# Patient Record
Sex: Male | Born: 1950
Health system: Southern US, Community
[De-identification: ages and names within clinical notes are randomized; demographics above are authoritative.]

## PROBLEM LIST (undated history)

## (undated) DIAGNOSIS — E039 Hypothyroidism, unspecified: Secondary | ICD-10-CM

## (undated) DIAGNOSIS — C679 Malignant neoplasm of bladder, unspecified: Secondary | ICD-10-CM

## (undated) DIAGNOSIS — G459 Transient cerebral ischemic attack, unspecified: Secondary | ICD-10-CM

## (undated) DIAGNOSIS — K4090 Unilateral inguinal hernia, without obstruction or gangrene, not specified as recurrent: Secondary | ICD-10-CM

## (undated) DIAGNOSIS — F32A Depression, unspecified: Secondary | ICD-10-CM

## (undated) DIAGNOSIS — F419 Anxiety disorder, unspecified: Secondary | ICD-10-CM

## (undated) DIAGNOSIS — M1990 Unspecified osteoarthritis, unspecified site: Secondary | ICD-10-CM

## (undated) DIAGNOSIS — D696 Thrombocytopenia, unspecified: Secondary | ICD-10-CM

## (undated) DIAGNOSIS — J189 Pneumonia, unspecified organism: Secondary | ICD-10-CM

## (undated) DIAGNOSIS — K648 Other hemorrhoids: Secondary | ICD-10-CM

## (undated) DIAGNOSIS — H353290 Exudative age-related macular degeneration, unspecified eye, stage unspecified: Secondary | ICD-10-CM

## (undated) DIAGNOSIS — E785 Hyperlipidemia, unspecified: Secondary | ICD-10-CM

## (undated) DIAGNOSIS — I2699 Other pulmonary embolism without acute cor pulmonale: Secondary | ICD-10-CM

## (undated) DIAGNOSIS — R7309 Other abnormal glucose: Secondary | ICD-10-CM

## (undated) DIAGNOSIS — I639 Cerebral infarction, unspecified: Secondary | ICD-10-CM

## (undated) DIAGNOSIS — Z87448 Personal history of other diseases of urinary system: Secondary | ICD-10-CM

## (undated) DIAGNOSIS — F329 Major depressive disorder, single episode, unspecified: Secondary | ICD-10-CM

## (undated) DIAGNOSIS — F1010 Alcohol abuse, uncomplicated: Secondary | ICD-10-CM

## (undated) DIAGNOSIS — I2510 Atherosclerotic heart disease of native coronary artery without angina pectoris: Secondary | ICD-10-CM

## (undated) DIAGNOSIS — I219 Acute myocardial infarction, unspecified: Secondary | ICD-10-CM

## (undated) DIAGNOSIS — K219 Gastro-esophageal reflux disease without esophagitis: Secondary | ICD-10-CM

## (undated) DIAGNOSIS — I10 Essential (primary) hypertension: Secondary | ICD-10-CM

## (undated) DIAGNOSIS — Z86718 Personal history of other venous thrombosis and embolism: Secondary | ICD-10-CM

## (undated) HISTORY — DX: Depression, unspecified: F32.A

## (undated) HISTORY — DX: Malignant neoplasm of bladder, unspecified: C67.9

## (undated) HISTORY — DX: Transient cerebral ischemic attack, unspecified: G45.9

## (undated) HISTORY — DX: Unilateral inguinal hernia, without obstruction or gangrene, not specified as recurrent: K40.90

## (undated) HISTORY — DX: Essential (primary) hypertension: I10

## (undated) HISTORY — DX: Atherosclerotic heart disease of native coronary artery without angina pectoris: I25.10

## (undated) HISTORY — DX: Alcohol abuse, uncomplicated: F10.10

## (undated) HISTORY — DX: Exudative age-related macular degeneration, unspecified eye, stage unspecified: H35.3290

## (undated) HISTORY — DX: Other hemorrhoids: K64.8

## (undated) HISTORY — DX: Unspecified osteoarthritis, unspecified site: M19.90

## (undated) HISTORY — DX: Cerebral infarction, unspecified: I63.9

## (undated) HISTORY — DX: Personal history of other diseases of urinary system: Z87.448

## (undated) HISTORY — DX: Major depressive disorder, single episode, unspecified: F32.9

## (undated) HISTORY — DX: Other abnormal glucose: R73.09

## (undated) HISTORY — DX: Personal history of other venous thrombosis and embolism: Z86.718

## (undated) HISTORY — DX: Anxiety disorder, unspecified: F41.9

## (undated) HISTORY — DX: Thrombocytopenia, unspecified: D69.6

## (undated) HISTORY — PX: TONSILLECTOMY: SUR1361

## (undated) HISTORY — DX: Hyperlipidemia, unspecified: E78.5

## (undated) HISTORY — PX: EYE SURGERY: SHX253

---

## 2002-04-24 ENCOUNTER — Emergency Department (HOSPITAL_COMMUNITY): Admission: EM | Admit: 2002-04-24 | Discharge: 2002-04-25 | Payer: Self-pay | Admitting: Emergency Medicine

## 2002-04-24 ENCOUNTER — Encounter: Payer: Self-pay | Admitting: Emergency Medicine

## 2003-09-27 DIAGNOSIS — K648 Other hemorrhoids: Secondary | ICD-10-CM | POA: Insufficient documentation

## 2003-10-12 LAB — HM COLONOSCOPY: HM Colonoscopy: ABNORMAL

## 2004-01-25 ENCOUNTER — Encounter: Admission: RE | Admit: 2004-01-25 | Discharge: 2004-01-25 | Payer: Self-pay | Admitting: Family Medicine

## 2004-02-03 ENCOUNTER — Encounter: Admission: RE | Admit: 2004-02-03 | Discharge: 2004-02-03 | Payer: Self-pay | Admitting: Internal Medicine

## 2004-02-04 ENCOUNTER — Encounter: Admission: RE | Admit: 2004-02-04 | Discharge: 2004-02-04 | Payer: Self-pay | Admitting: Family Medicine

## 2004-08-01 ENCOUNTER — Ambulatory Visit: Payer: Self-pay | Admitting: Family Medicine

## 2004-11-21 ENCOUNTER — Ambulatory Visit: Payer: Self-pay | Admitting: Family Medicine

## 2004-11-29 ENCOUNTER — Ambulatory Visit: Payer: Self-pay | Admitting: Family Medicine

## 2005-02-01 ENCOUNTER — Ambulatory Visit: Payer: Self-pay | Admitting: Internal Medicine

## 2005-05-25 ENCOUNTER — Observation Stay (HOSPITAL_COMMUNITY): Admission: EM | Admit: 2005-05-25 | Discharge: 2005-05-25 | Payer: Self-pay | Admitting: Emergency Medicine

## 2005-05-25 DIAGNOSIS — I639 Cerebral infarction, unspecified: Secondary | ICD-10-CM

## 2005-05-25 HISTORY — DX: Cerebral infarction, unspecified: I63.9

## 2005-05-27 ENCOUNTER — Ambulatory Visit: Payer: Self-pay | Admitting: Family Medicine

## 2005-05-28 ENCOUNTER — Ambulatory Visit: Payer: Self-pay | Admitting: Family Medicine

## 2005-06-07 ENCOUNTER — Ambulatory Visit: Payer: Self-pay

## 2005-06-13 ENCOUNTER — Ambulatory Visit: Payer: Self-pay | Admitting: Family Medicine

## 2005-06-18 ENCOUNTER — Ambulatory Visit: Payer: Self-pay | Admitting: Professional

## 2005-07-15 ENCOUNTER — Ambulatory Visit: Payer: Self-pay | Admitting: Family Medicine

## 2005-07-26 ENCOUNTER — Ambulatory Visit: Payer: Self-pay | Admitting: Licensed Clinical Social Worker

## 2005-08-02 ENCOUNTER — Ambulatory Visit: Payer: Self-pay | Admitting: Licensed Clinical Social Worker

## 2005-08-13 ENCOUNTER — Ambulatory Visit: Payer: Self-pay | Admitting: Licensed Clinical Social Worker

## 2005-08-30 ENCOUNTER — Ambulatory Visit: Payer: Self-pay | Admitting: Licensed Clinical Social Worker

## 2006-05-26 ENCOUNTER — Ambulatory Visit: Payer: Self-pay | Admitting: Licensed Clinical Social Worker

## 2006-07-11 ENCOUNTER — Ambulatory Visit: Payer: Self-pay | Admitting: Family Medicine

## 2006-08-26 DIAGNOSIS — G459 Transient cerebral ischemic attack, unspecified: Secondary | ICD-10-CM

## 2006-08-26 HISTORY — DX: Transient cerebral ischemic attack, unspecified: G45.9

## 2006-12-30 DIAGNOSIS — F329 Major depressive disorder, single episode, unspecified: Secondary | ICD-10-CM | POA: Insufficient documentation

## 2006-12-30 DIAGNOSIS — F411 Generalized anxiety disorder: Secondary | ICD-10-CM | POA: Insufficient documentation

## 2006-12-30 DIAGNOSIS — F39 Unspecified mood [affective] disorder: Secondary | ICD-10-CM | POA: Insufficient documentation

## 2006-12-30 DIAGNOSIS — F419 Anxiety disorder, unspecified: Secondary | ICD-10-CM | POA: Insufficient documentation

## 2007-03-05 ENCOUNTER — Ambulatory Visit: Payer: Self-pay | Admitting: Family Medicine

## 2007-03-05 DIAGNOSIS — F1010 Alcohol abuse, uncomplicated: Secondary | ICD-10-CM | POA: Insufficient documentation

## 2007-03-18 ENCOUNTER — Ambulatory Visit: Payer: Self-pay | Admitting: Licensed Clinical Social Worker

## 2007-08-08 ENCOUNTER — Encounter: Payer: Self-pay | Admitting: Family Medicine

## 2007-08-08 ENCOUNTER — Inpatient Hospital Stay (HOSPITAL_COMMUNITY): Admission: EM | Admit: 2007-08-08 | Discharge: 2007-08-11 | Payer: Self-pay | Admitting: Emergency Medicine

## 2007-08-08 ENCOUNTER — Ambulatory Visit: Payer: Self-pay | Admitting: Internal Medicine

## 2007-08-08 DIAGNOSIS — I82409 Acute embolism and thrombosis of unspecified deep veins of unspecified lower extremity: Secondary | ICD-10-CM | POA: Insufficient documentation

## 2007-08-08 DIAGNOSIS — Z86718 Personal history of other venous thrombosis and embolism: Secondary | ICD-10-CM | POA: Insufficient documentation

## 2007-08-10 ENCOUNTER — Encounter: Payer: Self-pay | Admitting: Family Medicine

## 2007-08-10 ENCOUNTER — Encounter: Payer: Self-pay | Admitting: Internal Medicine

## 2007-08-10 ENCOUNTER — Ambulatory Visit: Payer: Self-pay | Admitting: *Deleted

## 2007-08-11 ENCOUNTER — Encounter: Payer: Self-pay | Admitting: Family Medicine

## 2007-08-12 ENCOUNTER — Telehealth (INDEPENDENT_AMBULATORY_CARE_PROVIDER_SITE_OTHER): Payer: Self-pay | Admitting: *Deleted

## 2007-08-13 ENCOUNTER — Ambulatory Visit: Payer: Self-pay

## 2007-08-13 ENCOUNTER — Ambulatory Visit: Payer: Self-pay | Admitting: Cardiology

## 2007-08-13 ENCOUNTER — Telehealth: Payer: Self-pay | Admitting: Family Medicine

## 2007-08-13 ENCOUNTER — Ambulatory Visit: Payer: Self-pay | Admitting: Family Medicine

## 2007-08-17 ENCOUNTER — Ambulatory Visit: Payer: Self-pay | Admitting: Family Medicine

## 2007-08-17 LAB — CONVERTED CEMR LAB
INR: 2.9
Prothrombin Time: 20.5 s

## 2007-08-21 ENCOUNTER — Encounter: Payer: Self-pay | Admitting: Family Medicine

## 2007-08-24 ENCOUNTER — Ambulatory Visit: Payer: Self-pay | Admitting: Family Medicine

## 2007-08-24 LAB — CONVERTED CEMR LAB: Prothrombin Time: 25 s

## 2007-08-31 ENCOUNTER — Ambulatory Visit: Payer: Self-pay | Admitting: Family Medicine

## 2007-08-31 LAB — CONVERTED CEMR LAB
INR: 4.6 — ABNORMAL HIGH (ref 0.8–1.0)
Prothrombin Time: 27.7 s

## 2007-09-02 ENCOUNTER — Ambulatory Visit: Payer: Self-pay | Admitting: Family Medicine

## 2007-09-02 LAB — CONVERTED CEMR LAB: INR: 2.4

## 2007-09-09 ENCOUNTER — Ambulatory Visit: Payer: Self-pay | Admitting: Family Medicine

## 2007-09-09 LAB — CONVERTED CEMR LAB: INR: 3.1

## 2007-09-10 ENCOUNTER — Encounter: Payer: Self-pay | Admitting: Family Medicine

## 2007-09-17 ENCOUNTER — Ambulatory Visit: Payer: Self-pay | Admitting: Family Medicine

## 2007-09-23 ENCOUNTER — Encounter (INDEPENDENT_AMBULATORY_CARE_PROVIDER_SITE_OTHER): Payer: Self-pay | Admitting: Internal Medicine

## 2007-09-25 ENCOUNTER — Ambulatory Visit: Payer: Self-pay | Admitting: Family Medicine

## 2007-09-25 LAB — CONVERTED CEMR LAB: INR: 3.6

## 2007-10-15 ENCOUNTER — Ambulatory Visit: Payer: Self-pay | Admitting: Family Medicine

## 2007-10-15 LAB — CONVERTED CEMR LAB

## 2007-11-06 ENCOUNTER — Ambulatory Visit: Payer: Self-pay | Admitting: Family Medicine

## 2007-11-06 LAB — CONVERTED CEMR LAB
INR: 1.9
Prothrombin Time: 16.9 s

## 2007-11-20 ENCOUNTER — Ambulatory Visit: Payer: Self-pay | Admitting: Family Medicine

## 2008-01-22 ENCOUNTER — Ambulatory Visit: Payer: Self-pay | Admitting: Family Medicine

## 2008-01-22 LAB — CONVERTED CEMR LAB: INR: 1.4

## 2008-02-08 ENCOUNTER — Ambulatory Visit: Payer: Self-pay | Admitting: Family Medicine

## 2008-02-08 ENCOUNTER — Encounter (INDEPENDENT_AMBULATORY_CARE_PROVIDER_SITE_OTHER): Payer: Self-pay | Admitting: Internal Medicine

## 2008-02-08 DIAGNOSIS — M25519 Pain in unspecified shoulder: Secondary | ICD-10-CM | POA: Insufficient documentation

## 2008-03-08 ENCOUNTER — Ambulatory Visit (HOSPITAL_COMMUNITY): Admission: RE | Admit: 2008-03-08 | Discharge: 2008-03-08 | Payer: Self-pay | Admitting: Orthopaedic Surgery

## 2008-03-08 HISTORY — PX: SHOULDER SURGERY: SHX246

## 2008-03-11 ENCOUNTER — Telehealth: Payer: Self-pay | Admitting: Family Medicine

## 2008-03-11 ENCOUNTER — Ambulatory Visit: Payer: Self-pay | Admitting: Family Medicine

## 2008-03-14 LAB — CONVERTED CEMR LAB: INR: 1.1

## 2008-03-18 ENCOUNTER — Ambulatory Visit: Payer: Self-pay | Admitting: Family Medicine

## 2008-03-29 ENCOUNTER — Ambulatory Visit: Payer: Self-pay | Admitting: Family Medicine

## 2008-03-30 ENCOUNTER — Telehealth (INDEPENDENT_AMBULATORY_CARE_PROVIDER_SITE_OTHER): Payer: Self-pay | Admitting: Internal Medicine

## 2008-03-30 LAB — CONVERTED CEMR LAB
Basophils Relative: 0.4 % (ref 0.0–3.0)
Eosinophils Absolute: 0.1 10*3/uL (ref 0.0–0.7)
HCT: 50.6 % (ref 39.0–52.0)
Lymphocytes Relative: 27.6 % (ref 12.0–46.0)
MCHC: 34.1 g/dL (ref 30.0–36.0)
MCV: 95.2 fL (ref 78.0–100.0)
Monocytes Relative: 10.1 % (ref 3.0–12.0)
Neutro Abs: 4.1 10*3/uL (ref 1.4–7.7)
RDW: 12.8 % (ref 11.5–14.6)

## 2008-04-01 ENCOUNTER — Ambulatory Visit: Payer: Self-pay | Admitting: Family Medicine

## 2008-04-01 LAB — CONVERTED CEMR LAB
INR: 2.4
Prothrombin Time: 18.8 s

## 2008-04-18 ENCOUNTER — Ambulatory Visit: Payer: Self-pay | Admitting: Family Medicine

## 2008-04-18 LAB — CONVERTED CEMR LAB: INR: 1.8

## 2008-04-25 ENCOUNTER — Ambulatory Visit: Payer: Self-pay | Admitting: Family Medicine

## 2008-05-03 ENCOUNTER — Ambulatory Visit: Payer: Self-pay | Admitting: Family Medicine

## 2008-05-03 ENCOUNTER — Ambulatory Visit: Payer: Self-pay

## 2008-05-03 DIAGNOSIS — M79609 Pain in unspecified limb: Secondary | ICD-10-CM | POA: Insufficient documentation

## 2008-05-13 ENCOUNTER — Ambulatory Visit: Payer: Self-pay | Admitting: Family Medicine

## 2008-05-13 LAB — CONVERTED CEMR LAB: INR: 2.4

## 2008-05-26 ENCOUNTER — Ambulatory Visit: Payer: Self-pay | Admitting: Family Medicine

## 2008-06-07 ENCOUNTER — Telehealth (INDEPENDENT_AMBULATORY_CARE_PROVIDER_SITE_OTHER): Payer: Self-pay | Admitting: *Deleted

## 2008-06-23 ENCOUNTER — Ambulatory Visit: Payer: Self-pay | Admitting: Family Medicine

## 2008-06-23 LAB — CONVERTED CEMR LAB: INR: 2.6

## 2008-07-20 ENCOUNTER — Ambulatory Visit: Payer: Self-pay | Admitting: Family Medicine

## 2008-07-20 LAB — CONVERTED CEMR LAB: Prothrombin Time: 18.1 s

## 2008-08-17 ENCOUNTER — Ambulatory Visit: Payer: Self-pay | Admitting: Family Medicine

## 2008-08-23 ENCOUNTER — Telehealth: Payer: Self-pay | Admitting: Family Medicine

## 2008-08-24 LAB — CONVERTED CEMR LAB
INR: 1.6
Prothrombin Time: 15.6 s

## 2008-08-29 ENCOUNTER — Ambulatory Visit: Payer: Self-pay | Admitting: Family Medicine

## 2008-08-30 DIAGNOSIS — R7309 Other abnormal glucose: Secondary | ICD-10-CM | POA: Insufficient documentation

## 2008-08-30 LAB — CONVERTED CEMR LAB
ALT: 33 units/L (ref 0–53)
Albumin: 3.9 g/dL (ref 3.5–5.2)
Basophils Absolute: 0 10*3/uL (ref 0.0–0.1)
Basophils Relative: 0.4 % (ref 0.0–3.0)
CO2: 26 meq/L (ref 19–32)
Calcium: 8.8 mg/dL (ref 8.4–10.5)
Creatinine, Ser: 0.8 mg/dL (ref 0.4–1.5)
Direct LDL: 158.7 mg/dL
Eosinophils Absolute: 0.1 10*3/uL (ref 0.0–0.7)
GFR calc non Af Amer: 106 mL/min
HCT: 49.8 % (ref 39.0–52.0)
Hemoglobin: 17.4 g/dL — ABNORMAL HIGH (ref 13.0–17.0)
Iron: 99 ug/dL (ref 42–165)
MCHC: 35 g/dL (ref 30.0–36.0)
MCV: 93.9 fL (ref 78.0–100.0)
Neutro Abs: 2.7 10*3/uL (ref 1.4–7.7)
RBC: 5.3 M/uL (ref 4.22–5.81)
Total Bilirubin: 0.7 mg/dL (ref 0.3–1.2)

## 2008-09-05 ENCOUNTER — Encounter (INDEPENDENT_AMBULATORY_CARE_PROVIDER_SITE_OTHER): Payer: Self-pay | Admitting: *Deleted

## 2008-10-03 ENCOUNTER — Ambulatory Visit: Payer: Self-pay | Admitting: Family Medicine

## 2008-10-03 LAB — CONVERTED CEMR LAB: Prothrombin Time: 19.7 s

## 2008-10-12 ENCOUNTER — Encounter: Payer: Self-pay | Admitting: Family Medicine

## 2008-10-27 ENCOUNTER — Encounter: Payer: Self-pay | Admitting: Family Medicine

## 2008-11-11 ENCOUNTER — Encounter: Payer: Self-pay | Admitting: Family Medicine

## 2009-02-01 ENCOUNTER — Ambulatory Visit: Payer: Self-pay | Admitting: Family Medicine

## 2009-02-01 DIAGNOSIS — K4090 Unilateral inguinal hernia, without obstruction or gangrene, not specified as recurrent: Secondary | ICD-10-CM | POA: Insufficient documentation

## 2009-02-10 ENCOUNTER — Emergency Department (HOSPITAL_COMMUNITY): Admission: EM | Admit: 2009-02-10 | Discharge: 2009-02-10 | Payer: Self-pay | Admitting: Emergency Medicine

## 2009-05-22 ENCOUNTER — Encounter: Admission: RE | Admit: 2009-05-22 | Discharge: 2009-05-22 | Payer: Self-pay | Admitting: Orthopedic Surgery

## 2009-07-07 IMAGING — CT CT ANGIO CHEST
1 of 4 series · 19 of 36 positions shown · IV contrast (Omnipaque 300)
Comparison: 08/08/2007

CLINICAL DATA: Evaluate for pulmonary embolus.  
CT ANGIOGRAPHY OF CHEST:
TECHNIQUE: Multidetector CT imaging of the chest was performed during bolus injection of intravenous contrast.  Multiplanar CT angiographic image reconstructions were generated to evaluate the vascular anatomy.
Contrast:  866cc

[Series 5: pe 1.0 b20f st · axial · 0.80mm/px · z∈[-346,-53]mm · 19 of 327 slices shown]
[im 17/327  lung]
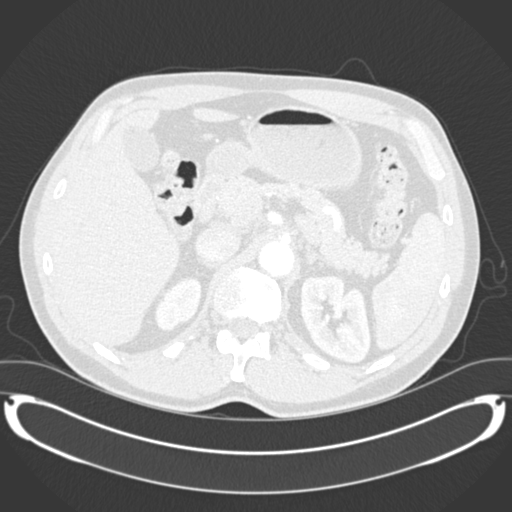
[im 33/327  mediastinal]
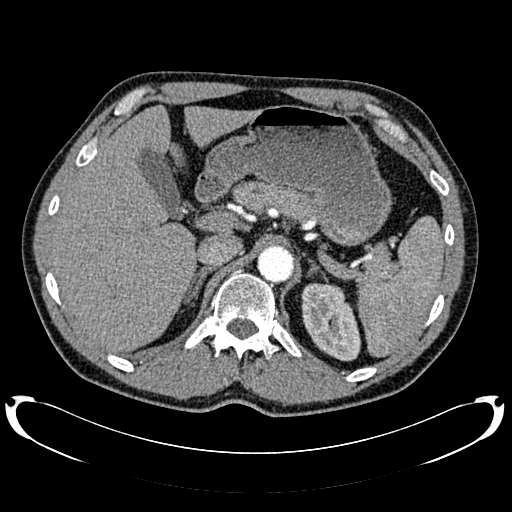
[im 49/327  lung]
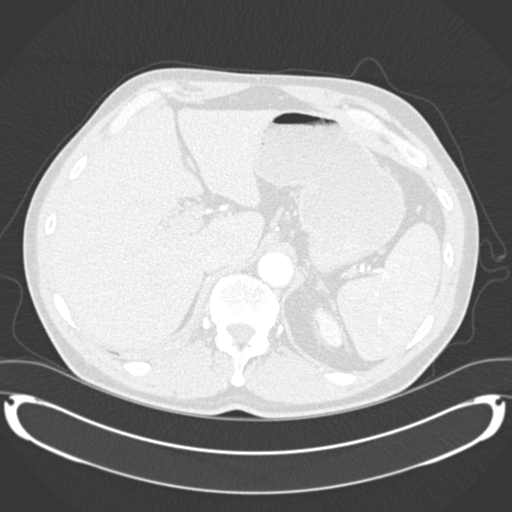
[im 66/327  mediastinal]
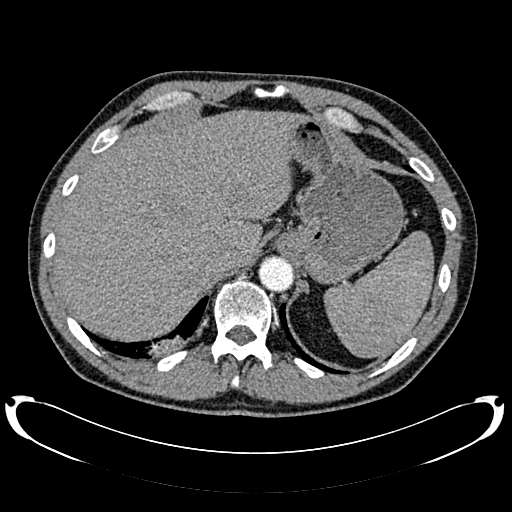
[im 82/327  lung]
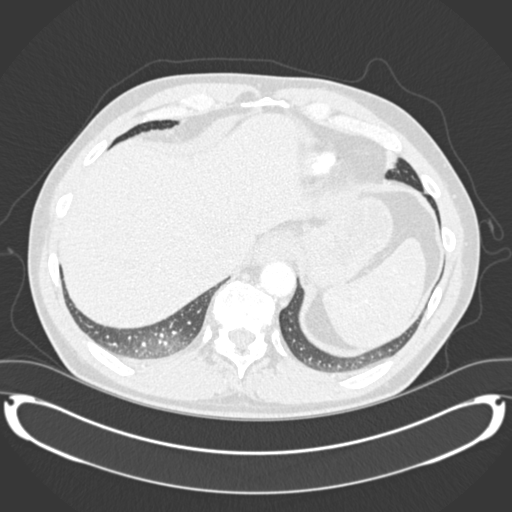
[im 98/327  mediastinal]
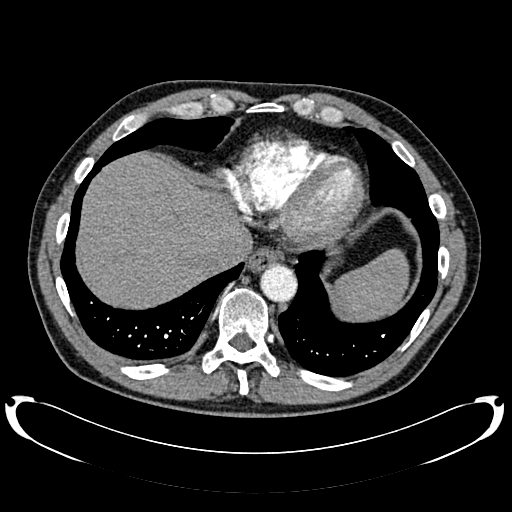
[im 115/327  lung]
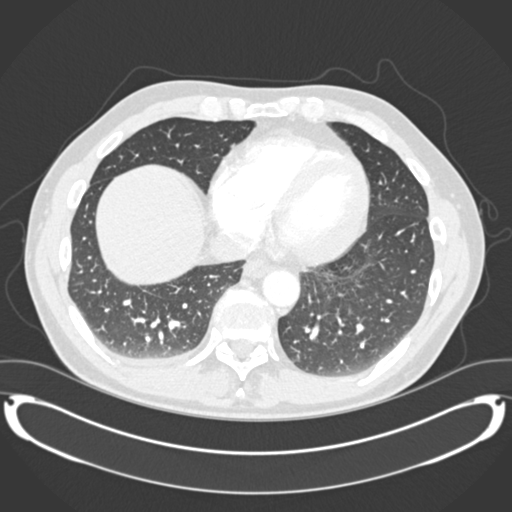
[im 131/327  mediastinal]
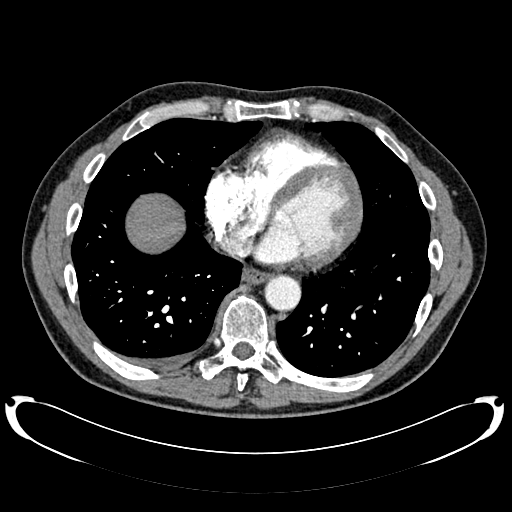
[im 147/327  lung]
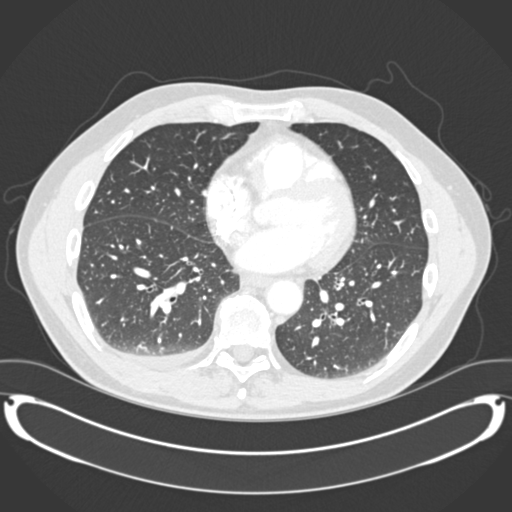
[im 164/327  mediastinal]
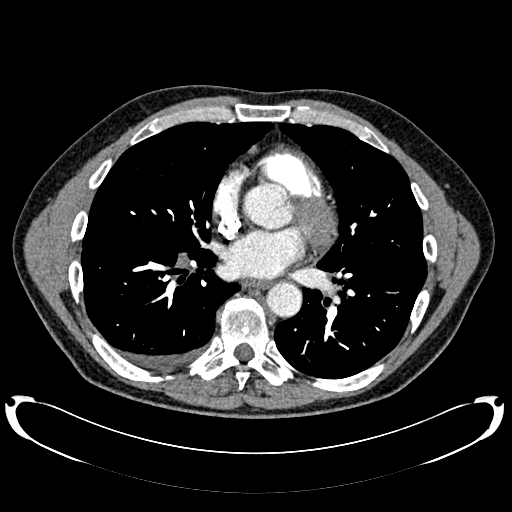
[im 180/327  lung]
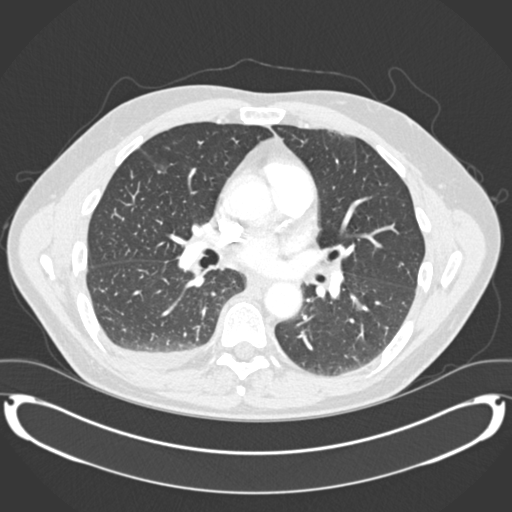
[im 196/327  mediastinal]
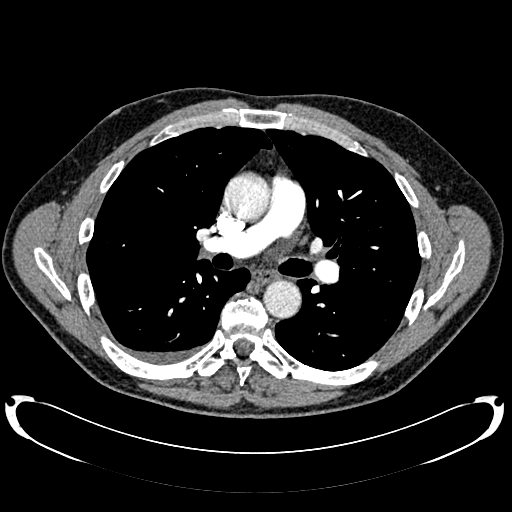
[im 212/327  lung]
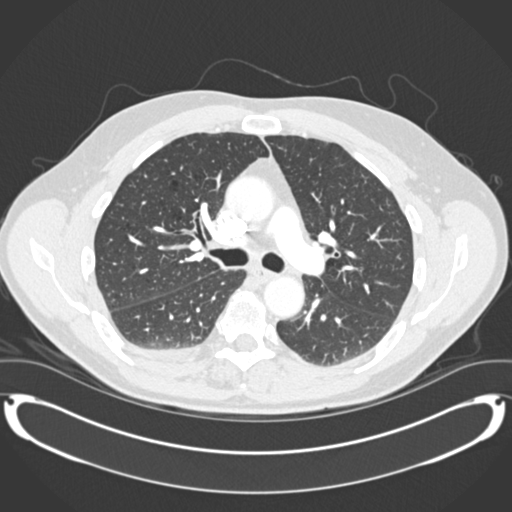
[im 229/327  mediastinal]
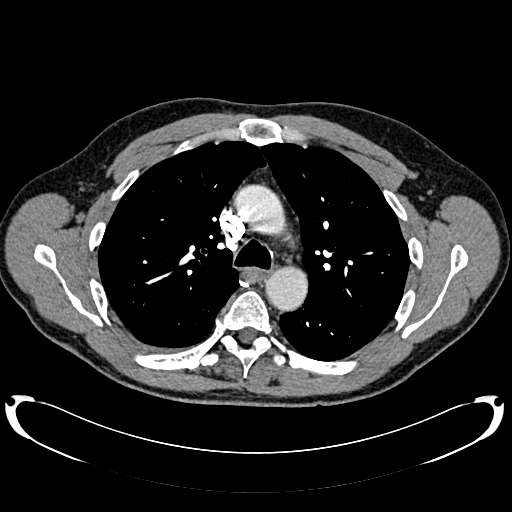
[im 245/327  lung]
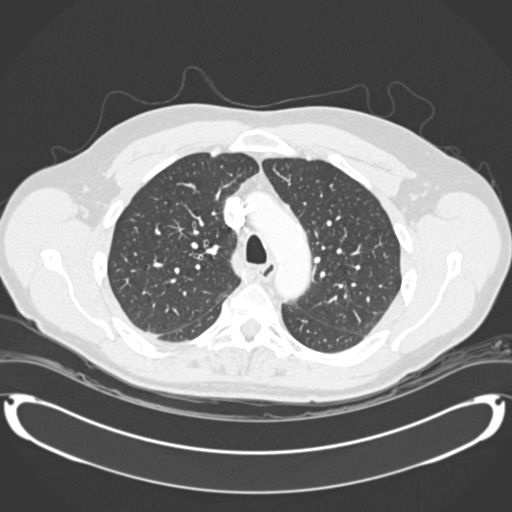
[im 261/327  mediastinal]
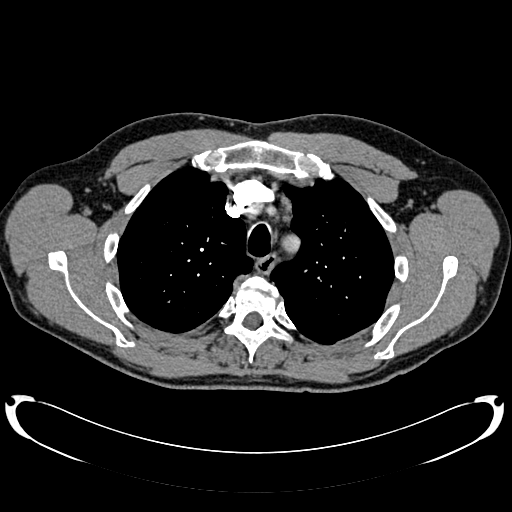
[im 278/327  lung]
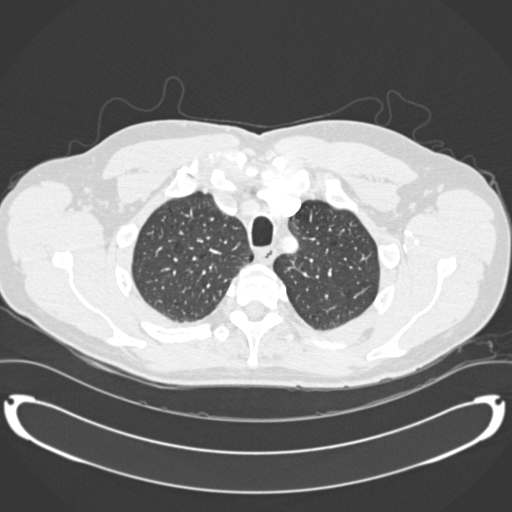
[im 294/327  mediastinal]
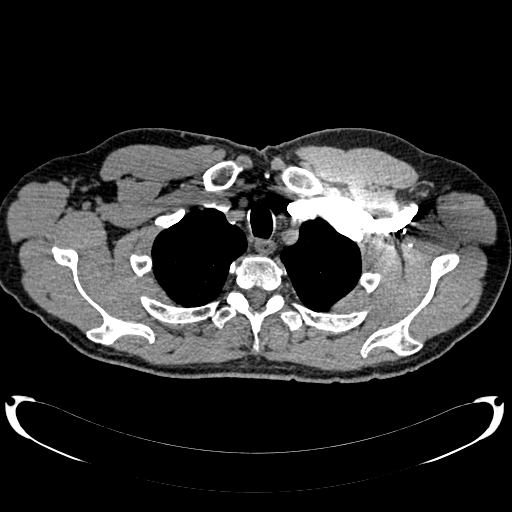
[im 310/327  lung]
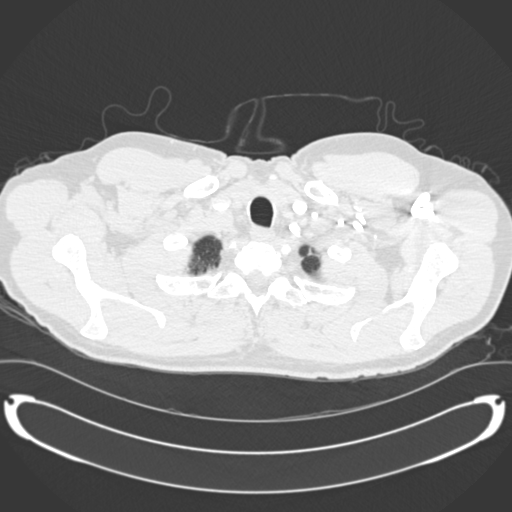

[19 of 36 positions shown; findings below may reference images not displayed]

FINDINGS: No abnormal filling defects are identified within the main pulmonary artery or its branches to suggest acute pulmonary embolus.  
No enlarged axillary lymph nodes. 
There are no enlarged supraclavicular lymph nodes. 
No pathologically enlarged mediastinal lymph nodes.   There is an enlarged right hilar lymph node which measures 12 x 9.6 mm, unchanged from recent exam.  
No enlarged left hilar lymph nodes.
There is a small right pleural effusion unchanged from prior exam. 
Subpleural consolidation is identified at the right base measuring 13.9 x 33 mm.  This has increased from prior exam.     
No suspicious pulmonary nodule or mass noted. 
Review of the bone windows shows multilevel thoracic spondylosis. 
Limited imaging through the upper abdomen shows no evidence for a mass.
IMPRESSION: 1.  No evidence for acute pulmonary embolus. 
2.  Increasing subpleural consolidation at the right base may be the sequela of infection.  Followup imaging recommended to ensure resolution.  
3.   Small right effusion. 
4.  Borderline enlarged right hilar lymph node.

## 2009-08-26 DIAGNOSIS — C679 Malignant neoplasm of bladder, unspecified: Secondary | ICD-10-CM

## 2009-08-26 HISTORY — DX: Malignant neoplasm of bladder, unspecified: C67.9

## 2009-12-26 ENCOUNTER — Ambulatory Visit: Payer: Self-pay | Admitting: Licensed Clinical Social Worker

## 2009-12-28 ENCOUNTER — Encounter: Payer: Self-pay | Admitting: Family Medicine

## 2010-01-04 ENCOUNTER — Telehealth: Payer: Self-pay | Admitting: Family Medicine

## 2010-01-08 ENCOUNTER — Ambulatory Visit: Payer: Self-pay | Admitting: Family Medicine

## 2010-01-08 ENCOUNTER — Ambulatory Visit: Payer: Self-pay | Admitting: Licensed Clinical Social Worker

## 2010-01-08 DIAGNOSIS — D696 Thrombocytopenia, unspecified: Secondary | ICD-10-CM | POA: Insufficient documentation

## 2010-01-08 LAB — CONVERTED CEMR LAB
Basophils Absolute: 0 10*3/uL (ref 0.0–0.1)
Eosinophils Absolute: 0.1 10*3/uL (ref 0.0–0.7)
Hemoglobin: 17.2 g/dL — ABNORMAL HIGH (ref 13.0–17.0)
Lymphocytes Relative: 19.7 % (ref 12.0–46.0)
Lymphs Abs: 1.6 10*3/uL (ref 0.7–4.0)
MCHC: 34.6 g/dL (ref 30.0–36.0)
MCV: 96.3 fL (ref 78.0–100.0)
Monocytes Absolute: 0.5 10*3/uL (ref 0.1–1.0)
Neutro Abs: 5.8 10*3/uL (ref 1.4–7.7)
RDW: 13.8 % (ref 11.5–14.6)

## 2010-01-10 ENCOUNTER — Ambulatory Visit: Payer: Self-pay | Admitting: Family Medicine

## 2010-03-26 HISTORY — PX: HERNIA REPAIR: SHX51

## 2010-03-29 ENCOUNTER — Encounter (INDEPENDENT_AMBULATORY_CARE_PROVIDER_SITE_OTHER): Payer: Self-pay | Admitting: *Deleted

## 2010-03-30 ENCOUNTER — Ambulatory Visit (HOSPITAL_COMMUNITY): Admission: RE | Admit: 2010-03-30 | Discharge: 2010-03-31 | Payer: Self-pay | Admitting: Surgery

## 2010-04-11 ENCOUNTER — Emergency Department (HOSPITAL_COMMUNITY): Admission: EM | Admit: 2010-04-11 | Discharge: 2010-04-11 | Payer: Self-pay | Admitting: Emergency Medicine

## 2010-04-25 ENCOUNTER — Telehealth: Payer: Self-pay | Admitting: Family Medicine

## 2010-05-01 ENCOUNTER — Ambulatory Visit: Payer: Self-pay | Admitting: Family Medicine

## 2010-05-01 DIAGNOSIS — Z8679 Personal history of other diseases of the circulatory system: Secondary | ICD-10-CM | POA: Insufficient documentation

## 2010-05-01 DIAGNOSIS — Z87448 Personal history of other diseases of urinary system: Secondary | ICD-10-CM | POA: Insufficient documentation

## 2010-05-01 LAB — CONVERTED CEMR LAB
Ketones, urine, test strip: NEGATIVE
Nitrite: NEGATIVE
Specific Gravity, Urine: 1.025
Urobilinogen, UA: 0.2
WBC Urine, dipstick: NEGATIVE

## 2010-05-02 ENCOUNTER — Telehealth: Payer: Self-pay | Admitting: Family Medicine

## 2010-05-03 ENCOUNTER — Encounter (INDEPENDENT_AMBULATORY_CARE_PROVIDER_SITE_OTHER): Payer: Self-pay | Admitting: *Deleted

## 2010-05-03 ENCOUNTER — Encounter: Payer: Self-pay | Admitting: Family Medicine

## 2010-05-07 ENCOUNTER — Telehealth: Payer: Self-pay | Admitting: Family Medicine

## 2010-05-31 ENCOUNTER — Encounter: Payer: Self-pay | Admitting: Family Medicine

## 2010-06-07 ENCOUNTER — Encounter: Payer: Self-pay | Admitting: Family Medicine

## 2010-06-14 ENCOUNTER — Encounter: Payer: Self-pay | Admitting: Family Medicine

## 2010-06-19 ENCOUNTER — Ambulatory Visit (HOSPITAL_COMMUNITY): Admission: RE | Admit: 2010-06-19 | Discharge: 2010-06-19 | Payer: Self-pay | Admitting: Urology

## 2010-07-18 ENCOUNTER — Encounter: Payer: Self-pay | Admitting: Family Medicine

## 2010-07-24 ENCOUNTER — Encounter: Payer: Self-pay | Admitting: Family Medicine

## 2010-08-26 DIAGNOSIS — I2510 Atherosclerotic heart disease of native coronary artery without angina pectoris: Secondary | ICD-10-CM

## 2010-08-26 HISTORY — DX: Atherosclerotic heart disease of native coronary artery without angina pectoris: I25.10

## 2010-09-23 LAB — CONVERTED CEMR LAB
Prothrombin Time: 19 s
Prothrombin Time: 25.4 s

## 2010-09-25 NOTE — Letter (Signed)
Summary: Nadara Eaton letter  [place] at [facility]  [address] [place], [place] [address]   Phone: [phone]  Fax: [phone]       03/29/2010 MRN: [id number]  PHIL Woodroof [address] [place], [place]  [address]  Dear Mr. [facility] [place], and [place] announce the retirement of Arta Silence, M.D., from full-time practice at the [facility] office effective February 22, 2010 and his plans of returning part-time.  It is important to Dr. Hetty Ely and to our practice that you understand that [facility] has seven physicians in our office for your health care needs.  We will continue to offer the same exceptional care that you have today.    Dr. Hetty Ely has spoken to many of you about his plans for retirement and returning part-time in the fall.   We will continue to work with you through the transition to schedule appointments for you in the office and meet the high standards that [place] is committed to.   Again, it is with great pleasure that we share the news that Dr. Hetty Ely will return to [facility] at [facility] in October of 2011 with a reduced schedule.    If you have any questions, or would like to request an appointment with one of our physicians, please call us at [phone] and press the option for Scheduling an appointment.  We take pleasure in providing you with excellent patient care and look forward to seeing you at your next office visit.  Our [facility] Physicians are:  Tillman Abide, M.D. Laurita Quint, M.D. Roxy Manns, M.D. Kerby Nora, M.D. Hannah Beat, M.D. Ruthe Mannan, M.D. We proudly welcomed Raechel Ache, M.D. and Eustaquio Boyden, M.D. to the practice in July/August 2011.  Sincerely,  [place] [facility]

## 2010-09-25 NOTE — Assessment & Plan Note (Signed)
Summary: transfer from [facility]   Vital Signs:  Patient profile:   60 year old male Height:      71.50 inches Weight:      201.25 pounds BMI:     27.78 Temp:     98.5 degrees F oral Pulse rate:   92 / minute Pulse rhythm:   regular BP sitting:   102 / 58  (left arm) Cuff size:   regular  Vitals Entered By: Delilah Shan CMA Duncan Dull) (May 01, 2010 3:06 PM) CC: Transfer from [facility].  Mix-up with ASA dosage.  Recently had sx. that he had prior to his TIA in 2006.   History of Present Illness: Blood in urine note by patient.  Had been taking 325 mg of ASA.  4 weeks s/p hernia surgery.  Started back on ASA 3 weeks after surgery.    Was prev on coumadin for DVT.  Is off that now.  Was on lovenox after the surgery.  Had vision change that was brief 3 weeks ago.  In L field of vision.  Called EMS and patient was back to baseline by the time they arrived.  Went to ER.  Head CT was negative- report reviewed.  Dc'd to home.  Had double dose of ASA after the ER visit.  Since then had 1 episode with blood in urine.    Vision change in 2006 with TIA while smoking.  Not smoking now.   Has been seen at [facility].  Has follow up pending Thursday.   No other complaints now.  No other focal neuro changes.   Current Medications (verified): 1)  Tranxene-T 7.5 Mg Tabs (Clorazepate Dipotassium) .... 1/2 Capsule Twice A Day By Mouth 2)  Daily Multiple Vitamins  Tabs (Multiple Vitamin) .Marland Kitchen.. 1 Once Daily By Mouth 3)  Zoloft 100 Mg Tabs (Sertraline Hcl) .... Take 2  Daily By Mouth 4)  Adult Aspirin Low Strength 81 Mg Tbdp (Aspirin) .... One Daily By Mouth  Allergies: 1)  ! Lexapro (Escitalopram Oxalate) 2)  Tessalon Perles (Benzonatate) 3)  * Sulfa (Sulfonamides) Group  Past History:  Past Medical History: Anxiety Depression h/o DVT 11/08 - prev on Coumadin TIA 2008  Social History: Occupation: Brewing technologist, 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. no tob for last few years  as of 2011 no alcohol 2011  Review of Systems       See HPI.  Otherwise negative.    Physical Exam  General:  GEN: nad, alert and oriented HEENT: mucous membranes moist NECK: supple w/o LA CV: rrr.  no murmur PULM: ctab, no inc wob ABD: soft, +bs EXT: no edema SKIN: no acute rash  CN 2-12 wnl B, S/S/DTR wnl x4    Impression & Recommendations:  Problem # 1:  TRANSIENT ISCHEMIC ATTACKS, HX OF (ICD-V12.50) >25 min spent with patient, at least half of which was spent on counseling re:dx and treatment.  Pt to follow up with cards.  Has had recent normal head CT and has no  bruit today.  Will return for fasting labs.  Needs secondary prevention.  Of note, his initial TIA was not on ASA so 81mg  of ASA would be reasonable for now, except as below.   Problem # 2:  HEMATURIA, HX OF (ICD-V13.09) Pt with hematuria on dip today.  I want patient to return for repeat UA and send out for cx and micro (off ASA for 3 days).  If negative, then no other follow up.  If  persistent, then I want patient to have follow up with uro.  See phone note . Orders: UA Dipstick w/o Micro (manual) ([id number])  Complete Medication List: 1)  Tranxene-t 7.5 Mg Tabs (Clorazepate dipotassium) .... 1/2 capsule twice a day by mouth 2)  Daily Multiple Vitamins Tabs (Multiple vitamin) .Marland Kitchen.. 1 once daily by mouth 3)  Zoloft 100 Mg Tabs (Sertraline hcl) .... Take 2  daily by mouth 4)  Adult Aspirin Low Strength 81 Mg Tbdp (Aspirin) .... One daily by mouth  Patient Instructions: 1)  Please come back for fasting labs: cmet/lipid----v12.50 2)  Take 81mg  of aspirin a day.   3)  We'll contact you with your lab report.   Current Allergies (reviewed today): ! LEXAPRO (ESCITALOPRAM OXALATE) TESSALON PERLES (BENZONATATE) * SULFA (SULFONAMIDES) GROUP  Laboratory Results   Urine Tests  Date/Time Received: May 01, 2010 4:15 PM   Routine Urinalysis   Color: orange Appearance: Cloudy Glucose: negative   (Normal Range:  Negative) Bilirubin: negative   (Normal Range: Negative) Ketone: negative   (Normal Range: Negative) Spec. Gravity: 1.025   (Normal Range: 1.003-1.035) Blood: large   (Normal Range: Negative) pH: 6.0   (Normal Range: 5.0-8.0) Protein: negative   (Normal Range: Negative) Urobilinogen: 0.2   (Normal Range: 0-1) Nitrite: negative   (Normal Range: Negative) Leukocyte Esterace: negative   (Normal Range: Negative)

## 2010-09-25 NOTE — Consult Note (Signed)
[facility]: [facility]   Imported By: Sherian Rein 06/07/2010 10:22:34  _____________________________________________________________________  External Attachment:    Type:   Image     Comment:   External Document  Appended Document: [facility]     Clinical Lists Changes  Observations: Added new observation of PAST MED HX: Anxiety Depression h/o DVT 11/08 - prev on Coumadin TIA 2008 H/o hematuria- per Dr. Brunilda Payor 2011 (06/07/2010 21:04)         Past History:  Past Medical History: Anxiety Depression h/o DVT 11/08 - prev on Coumadin TIA 2008 H/o hematuria- per Dr. Brunilda Payor 2011

## 2010-09-25 NOTE — Letter (Signed)
Summary: [place] No Show Letter  [place] at [facility]  [address] [place], [place] [address]   Phone: [phone]  Fax: [phone]    05/03/2010 MRN: [id number]  [name] [address] [place], [place]  [address]   Dear Mr. Ryles,   Our records indicate that you missed your scheduled appointment with ______labs_______________ on ____9/7/2011________.  Please contact this office to reschedule your appointment as soon as possible.  It is important that you keep your scheduled appointments with your physician, so we can provide you the best care possible.  Please be advised that there may be a charge for "no show" appointments.    Sincerely,   [place] at [facility]

## 2010-09-25 NOTE — Letter (Signed)
Summary: [facility]   Imported By: Lanelle Bal 07/28/2010 09:36:09  _____________________________________________________________________  External Attachment:    Type:   Image     Comment:   External Document  Appended Document: [facility]     Clinical Lists Changes

## 2010-09-25 NOTE — Progress Notes (Signed)
  Phone Note Outgoing Call   Summary of Call: Please call patient and tell him that with the blood in his urine, we need to recheck it.  I want him to come back for a UA (in house dip) and a send out ucx and micro.  Dx v13.09.  make sure he is off the aspirin for 3 days before the urine is collected.  If postive at that point, he'll need uro follow up.  If negative, he can follow up as needed.  He still needs his other blood labs done as per previous instructions, if not already done.  thanks. Para March MD, Cheree Ditto   Follow-up for Phone Call        Patient Advised.   He will be in on Friday for the repeat UA as he did not take any aspirin yesterday or today so Thursday will be the 3rd day.  [name] CMA (AAMA)  May 02, 2010 11:44 AM

## 2010-09-25 NOTE — Letter (Signed)
Summary: [facility]   Imported By: Lanelle Bal 06/25/2010 11:51:24  _____________________________________________________________________  External Attachment:    Type:   Image     Comment:   External Document  Appended Document: [facility]     Clinical Lists Changes  Observations: Added new observation of PAST MED HX: Anxiety Depression h/o DVT 11/08 - prev on Coumadin TIA 2008 bladder CA- per Dr. Brunilda Payor 2011 (06/25/2010 13:47)       Past History:  Past Medical History: Anxiety Depression h/o DVT 11/08 - prev on Coumadin TIA 2008 bladder CA- per Dr. Brunilda Payor 2011

## 2010-09-25 NOTE — Letter (Signed)
Summary: [facility]   Imported By: Lester Elmsford 07/28/2010 10:51:14  _____________________________________________________________________  External Attachment:    Type:   Image     Comment:   External Document

## 2010-09-25 NOTE — Assessment & Plan Note (Signed)
Summary: DISCUSS LAB RESULTS AND MEDS   Vital Signs:  Patient profile:   60 year old male Weight:      210.75 pounds BMI:     29.09 Temp:     98.2 degrees F oral Pulse rate:   56 / minute Pulse rhythm:   regular BP sitting:   120 / 78  (left arm) Cuff size:   regular  Vitals Entered By: Sydell Axon LPN (Jan 10, 2010 10:15 AM) CC: Discuss lab results and meds   History of Present Illness: Pt here for followup of Plt count. He took 500mg  Depakote pill the night before he had his CBC checked by Dr Nolen Mu and Plts were slightly low. He was told to stop the Depakote and see me. On repeat, the plts are back up to 155k. He has had her Zoloft doubled and is doing well. He thinks he may not need the Depakote.   Problems Prior to Update: 1)  Unspecified Thrombocytopenia  (ICD-287.5) 2)  Inguinal Hernia, Right  (ICD-550.90) 3)  Hyperglycemia  (ICD-790.29) 4)  Special Screening Malignant Neoplasm of Prostate  (ICD-V76.44) 5)  Screening For Lipoid Disorders  (ICD-V77.91) 6)  Health Maintenance Exam  (ICD-V70.0) 7)  Calf Pain, Left  (ICD-729.5) 8)  Shoulder Pain, Left  (ICD-719.41) 9)  Pulmonary Embolism  (ICD-415.19) 10)  Encounter For Therapeutic Drug Monitoring  (ICD-V58.83) 11)  Encounter For Long-term Use of Anticoagulants  (ICD-V58.61) 12)  Deep Venous Thrombophlebitis, Leg, Right  (ICD-453.40) 13)  Abuse, Alcohol, Continuous  (ICD-305.01) 14)  Depression  (ICD-311) 15)  Anxiety  (ICD-300.00) 16)  Hemorrhoids, Internal  (ICD-455.0)  Medications Prior to Update: 1)  Tranxene-T 7.5 Mg Tabs (Clorazepate Dipotassium) .... 1/2 Capsule Twice A Day By Mouth 2)  Daily Multiple Vitamins  Tabs (Multiple Vitamin) .Marland Kitchen.. 1 Once Daily By Mouth 3)  Zoloft 100 Mg Tabs (Sertraline Hcl) .Marland Kitchen.. 11/2 Daily By Mouth 4)  Adult Aspirin Low Strength 81 Mg Tbdp (Aspirin) .... One Daily By Mouth  Allergies: 1)  ! Lexapro (Escitalopram Oxalate) 2)  Tessalon Perles (Benzonatate) 3)  * Sulfa (Sulfonamides)  Group  Physical Exam  General:  Well-developed,well-nourished,in no acute distress; alert,appropriate and cooperative throughout examination Head:  Normocephalic and atraumatic without obvious abnormalities. No apparent alopecia or balding. Eyes:  Conjunctiva clear bilaterally.  Lungs:  Normal respiratory effort, chest expands symmetrically. Lungs are clear to auscultation, no crackles or wheezes. Heart:  Normal rate and regular rhythm. S1 and S2 normal without gallop, murmur, click, rub or other extra sounds.   Impression & Recommendations:  Problem # 1:  UNSPECIFIED THROMBOCYTOPENIA (ICD-287.5) Assessment Improved Back to nml range. Will repeat in 2 weeks just to verify. If Dr Nolen Mu still wants to try after that, assumimg reopeat Plt ct ok, would take one dose again andf repeat Plt ct the next morning as before. If plts low, would avoid Depakote.  Problem # 2:  INGUINAL HERNIA, RIGHT (ICD-550.90) Assessment: Unchanged Again discussed approach. Follow with Dr Daphine Deutscher if becomes symptomatic. Again discussed reduction.  Complete Medication List: 1)  Tranxene-t 7.5 Mg Tabs (Clorazepate dipotassium) .... 1/2 capsule twice a day by mouth 2)  Daily Multiple Vitamins Tabs (Multiple vitamin) .Marland Kitchen.. 1 once daily by mouth 3)  Zoloft 100 Mg Tabs (Sertraline hcl) .... Take 2  daily by mouth 4)  Adult Aspirin Low Strength 81 Mg Tbdp (Aspirin) .... One daily by mouth  Patient Instructions: 1)  Pls schedule for another CBC in 2 weeks. Will repor on phone tree.  Current Allergies (reviewed today): ! LEXAPRO (ESCITALOPRAM OXALATE) TESSALON PERLES (BENZONATATE) * SULFA (SULFONAMIDES) GROUP

## 2010-09-25 NOTE — Progress Notes (Signed)
Summary: has noticed blood in urine  Phone Note Call from Patient Call back at Home Phone [phone]   Caller: Patient Call For: Dr. Para March  Summary of Call: Patient states that he has noticed blood in his urine on one occasion.  On the day that he saw the urine in his blood he accidently took two aspirins.  He called to see if it was because he was taking too much aspirin. He says that he has been taking 325 mg daily, so on the day he took 2, he actually had 650 mg of aspirin. I told him that he is supposed to only be taking 81 mg daily and that he was taken off of the 325 mg in 2009. He is also due for an appt. so I scheduled him an appt for next week to get established w/ you. Initial call taken by: Melody Comas,  April 25, 2010 2:08 PM  Follow-up for Phone Call        Noted, can address then.  follow up sooner as needed.  Follow-up by: Crawford Givens MD,  April 25, 2010 3:37 PM

## 2010-09-25 NOTE — Letter (Signed)
Summary: [facility]   Imported By: Sherian Rein 05/12/2010 09:42:03  _____________________________________________________________________  External Attachment:    Type:   Image     Comment:   External Document

## 2010-09-25 NOTE — Letter (Signed)
Summary: Dr.Parish McKinney  Dr.Parish McKinney   Imported By: Beau Fanny 01/08/2010 14:24:32  _____________________________________________________________________  External Attachment:    Type:   Image     Comment:   External Document

## 2010-09-25 NOTE — Progress Notes (Signed)
Summary: ? urology referral  Phone Note Call from Patient Call back at Home Phone [phone]   Caller: Patient Call For: Crawford Givens MD Summary of Call: Patient did not show up last week for the repeat U/A that we had scheduled.  He also "no showed" for labs here.  He was very confused this morning.  He said he went for labs at [facility] because he was told he could not get them here.  We finally discovered that he had a lab order from [facility] that could not be drawn here.  He forgot about coming here for the urine recheck.  He took part of an aspirin on Saturday so I sat him up for a nurse visit on Wednesday for a repeat U/A.  He now calls back and says he would like to just go ahead and get the Urology referral rather than waiting for the U/A results.  He says he knows there is still blood in his urine.   He would like to see someone in [place]. Initial call taken by: Delilah Shan CMA Joaquina Nissen Dull),  May 07, 2010 12:09 PM  Follow-up for Phone Call        noted.  referral ordered.  Follow-up by: Crawford Givens MD,  May 07, 2010 1:40 PM

## 2010-09-25 NOTE — Progress Notes (Signed)
Summary: please review lab results  Phone Note Call from Patient Call back at Home Phone [phone]   Caller: Patient Call For: Shaune Leeks MD Summary of Call: Pt's psychiatrist wants pt to take depakote, but after she got his lab results she told him he shouldnt take it.  Pt is asking that you review those results and recommend something else.  He has not been seen in almost a year, so I made appt for next week. Initial call taken by: Lowella Petties CMA,  Jan 04, 2010 10:13 AM  Follow-up for Phone Call        Will discuss when I see him next week but Dr Nolen Mu is a Psychiatrist and I do not prescribe phychiatric meds. Please have him get repeat CBC before being seen. 287.5 Follow-up by: Shaune Leeks MD,  Jan 04, 2010 1:22 PM  Additional Follow-up for Phone Call Additional follow up Details #1::        Advised pt, lab appt made. Additional Follow-up by: Lowella Petties CMA,  Jan 04, 2010 2:07 PM

## 2010-09-25 NOTE — Progress Notes (Signed)
Summary: Depakote  Phone Note From Other Clinic Call back at [phone] or [phone]   Caller: Dorisann Frames Call For: Dr. Hetty Ely Summary of Call: They called to let you know that they are not putting patient on Depakote because of his low platelet count.  I informed her that patient has an appt. scheduled with you next week. She was just calling to make you aware of this.  Initial call taken by: Sydell Axon LPN,  Jan 04, 2010 4:32 PM  Follow-up for Phone Call        Noted. Follow-up by: Shaune Leeks MD,  Jan 04, 2010 4:38 PM

## 2010-11-09 LAB — POCT I-STAT, CHEM 8
Calcium, Ion: 1.14 mmol/L (ref 1.12–1.32)
HCT: 50 % (ref 39.0–52.0)
TCO2: 27 mmol/L (ref 0–100)

## 2010-11-09 LAB — URINALYSIS, ROUTINE W REFLEX MICROSCOPIC
Glucose, UA: NEGATIVE mg/dL
Hgb urine dipstick: NEGATIVE
Ketones, ur: NEGATIVE mg/dL
Protein, ur: NEGATIVE mg/dL

## 2010-11-09 LAB — DIFFERENTIAL
Basophils Absolute: 0 10*3/uL (ref 0.0–0.1)
Basophils Relative: 0 % (ref 0–1)
Eosinophils Relative: 1 % (ref 0–5)
Monocytes Absolute: 0.5 10*3/uL (ref 0.1–1.0)

## 2010-11-09 LAB — CBC
HCT: 46.1 % (ref 39.0–52.0)
MCHC: 35.6 g/dL (ref 30.0–36.0)
MCV: 94.3 fL (ref 78.0–100.0)
RDW: 13.3 % (ref 11.5–15.5)

## 2010-11-09 LAB — ANTITHROMBIN III: AntiThromb III Func: 85 % (ref 75–120)

## 2010-11-09 LAB — FACTOR 5 LEIDEN

## 2010-11-10 LAB — CBC
MCV: 96.6 fL (ref 78.0–100.0)
Platelets: 144 10*3/uL — ABNORMAL LOW (ref 150–400)
RBC: 5.02 MIL/uL (ref 4.22–5.81)
RDW: 13.6 % (ref 11.5–15.5)
WBC: 5.6 10*3/uL (ref 4.0–10.5)

## 2010-11-10 LAB — DIFFERENTIAL
Basophils Absolute: 0.1 10*3/uL (ref 0.0–0.1)
Eosinophils Relative: 1 % (ref 0–5)
Lymphocytes Relative: 34 % (ref 12–46)
Lymphs Abs: 1.9 10*3/uL (ref 0.7–4.0)
Neutrophils Relative %: 53 % (ref 43–77)

## 2010-11-10 LAB — SURGICAL PCR SCREEN
MRSA, PCR: NEGATIVE
Staphylococcus aureus: NEGATIVE

## 2010-11-10 LAB — BASIC METABOLIC PANEL
BUN: 13 mg/dL (ref 6–23)
Calcium: 8.9 mg/dL (ref 8.4–10.5)
Chloride: 109 mEq/L (ref 96–112)
Creatinine, Ser: 0.86 mg/dL (ref 0.4–1.5)
GFR calc Af Amer: >60 mL/min (ref >60)
GFR calc non Af Amer: >60 mL/min (ref >60)

## 2010-11-10 LAB — PROTIME-INR
INR: 1.06 (ref 0.00–1.49)
Prothrombin Time: 13.7 seconds (ref 11.6–15.2)

## 2010-12-03 LAB — URINALYSIS, ROUTINE W REFLEX MICROSCOPIC
Glucose, UA: NEGATIVE mg/dL
Ketones, ur: NEGATIVE mg/dL
Leukocytes, UA: NEGATIVE
Protein, ur: NEGATIVE mg/dL
Urobilinogen, UA: 0.2 mg/dL (ref 0.0–1.0)

## 2010-12-03 LAB — POCT I-STAT, CHEM 8
BUN: 23 mg/dL (ref 6–23)
Chloride: 107 mEq/L (ref 96–112)
HCT: 53 % — ABNORMAL HIGH (ref 39.0–52.0)
Potassium: 4 mEq/L (ref 3.5–5.1)

## 2010-12-03 LAB — URINE MICROSCOPIC-ADD ON

## 2010-12-24 ENCOUNTER — Encounter: Payer: Self-pay | Admitting: Family Medicine

## 2010-12-24 DIAGNOSIS — Z8551 Personal history of malignant neoplasm of bladder: Secondary | ICD-10-CM | POA: Insufficient documentation

## 2010-12-24 DIAGNOSIS — C679 Malignant neoplasm of bladder, unspecified: Secondary | ICD-10-CM | POA: Insufficient documentation

## 2011-01-08 NOTE — Op Note (Signed)
[name], [name]                 ACCOUNT NO.:  [id number]   MEDICAL RECORD NO.:  [id number]          PATIENT TYPE:  AMB   LOCATION:  SDS                          FACILITY:  [facility]   PHYSICIAN:  Vanita Panda. Magnus Ivan, M.D.DATE OF BIRTH:  [birth year]   DATE OF PROCEDURE:  03/08/2008  DATE OF DISCHARGE:  03/08/2008                               OPERATIVE REPORT   PREOPERATIVE DIAGNOSIS:  Left shoulder severe impingement syndrome.   POSTOPERATIVE DIAGNOSIS:  Left shoulder severe impingement syndrome.   PROCEDURE:  1. Left shoulder arthroscopy with extensive debridement of the      glenohumeral joint.  2. Left shoulder subacromial decompression with coracoacromial      ligament release.   SURGEON:  Vanita Panda. Magnus Ivan, MD   ANESTHESIA:  General.   BLOOD LOSS:  Minimal.   COMPLICATIONS:  None.   INDICATIONS:  Briefly, [name] is a 60 year old gentleman with severe  tendonitis and bursitis involving his left shoulder that had flared up  quite extensively.  I tried to get this calmed down with injection and  after it would not calm down, an MRI was obtained and it showed  significant bursitis and the shoulder in impingement.  There was no  evidence of rotator cuff tearing due to the fact that this was not  improving at all, we recommended that he undergo arthroscopic surgery  with debridement and subacromial decompression.  The risks and benefits  of this were explained to him and well understood.  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  I was able to take him off his Coumadin at the same  time bridged him with Lovenox, until surgery today.  He had a normalized  INR and was ready for surgery.  The risk and benefits of this were  explained to him at length and he agreed to proceed with surgery.   PROCEDURE:  After informed consent was obtained and appropriate left  shoulder was marked.  He was brought to the  operating room and placed  supine on the operating table.  General anesthesia was then obtained.  He was then fashioned to a beach-chair position with appropriate padding  and positioning the head and neck and padding of the down nonoperative  right arm.  There was also appropriate bending at the waist and knees.  His left arm and shoulder were then prepped and draped with DuraPrep and  sterile drapes including a sterile stockinette.  His arm was then placed  in-line skeletal traction at 45 degrees of forward flexion, 10 pounds of  traction, neutral rotation, and neutral abduction/adduction.  A time-out  was called to identify the correct patient and the correct left  shoulder.  I then made a posterior lateral incision off the posterior  lateral edge of acromion, and entered the glenohumeral joint with the  arthroscope.  Monitorly, you could see that there was abundant synovitis  and inflamed tissue within the glenohumeral joint and mild fraying of  the superior aspect of the  labrum, but no torn structure is intact.  There was some evidence of calcific tendonitis.  I then made an anterior  portal at the interval between the biceps tendon and the subscapularis  tendon lateral to the coracoid process and anteriorly an extensive  debridement was then carried out with soft tissue ablation wand and the  shaver in the humeral joint.  Next, the posterior portal, I entered the  subacromial space, there was extensive bursitis and tendinosis and  tendonitis noted in this space.  I made a lateral portal just off the  lateral edge of the acromion, and an arthroscopic soft tissue ablation  wand was placed.  I had to do extensive debridement in this area  including bursectomy and cleaning tissue on top of the rotator cuff and  rotator cuff was intact.  There was significant tissue along the  anterior aspect of the shoulder that was scarring and impingement from  the acromion.  I used a soft tissue  ablation wand to clean this area and  the CA ligament was released and I encountered abundant bleeding.  This  made it quite difficult for performing a partial acromioplasty and I did  attempt to do this with an arthroscopic high-speed burr and was able to  burr in between the clavicle and the acromion as well.  I then removed  all instrumentation and allowed fluid drain from the shoulder.  I then  closed the portal sites all with interrupted 3-0 nylon suture.  I then  inserted a mixture of morphine and 0.25% Sensorcaine with epinephrine  into the subacromial space in the shoulder.  I used a Sensorcaine at the  portal sites as well.  Xeroform followed and well-padded dry sterile  dressing was applied.  The patient's arm was placed in a sling, and he  was awakened, extubated, and taken to the recovery room in stable  condition.  All final counts were correct.  There were no complications  noted.       Vanita Panda. Magnus Ivan, M.D.  Electronically Signed     CYB/MEDQ  D:  03/08/2008  T:  03/09/2008  Job:  [id number]

## 2011-01-08 NOTE — H&P (Signed)
[name], [name]                 ACCOUNT NO.:  [id number]   MEDICAL RECORD NO.:  [id number]          PATIENT TYPE:  EMS   LOCATION:  MAJO                         FACILITY:  [facility]   PHYSICIAN:  Georgina Quint. Plotnikov, MDDATE OF BIRTH:  October 14, 1950   DATE OF ADMISSION:  08/08/2007  DATE OF DISCHARGE:                              HISTORY & PHYSICAL   CHIEF COMPLAINT:  Chest pain right-sided right leg pain.   HISTORY OF PRESENT ILLNESS:  The patient is a 60 year old male who  developed severe cramping right-sided chest pain, and shortness of  breath last Tuesday.  He was able to sleep on the recliner leaning  forward over a pillow.  The pain was worse with breath and movement.  He  also had pain in the right leg.  He presented, today, to the ER for  evaluation and treatment.  The CT scan of the chest came back positive  for right lower lobe PE.   PAST MEDICAL HISTORY:  1. Anxiety.  2. History of TIA.  3. History of foot surgery on November 25.  There is still a pin      present in the right big toe.  Sutures were removed on Monday by      Dr. Charlsie Merles.   CURRENT MEDICATIONS:  Tranxene SD  and Zoloft both daily.   FAMILY HISTORY:  Negative for DVT/PE.   SOCIAL HISTORY:  Denies smoking.  He is a Photographer.   ALLERGIES:  SULFA.   REVIEW OF SYSTEMS:  No blood in his stool or urine, chest pain and leg  pain as above. Other 18-point review of systems is negative.   PHYSICAL EXAMINATION:  GENERAL:  He looks very uncomfortable with pain  in the chest.  VITAL SIGNS:  Temperature 98.1, heart rate 70, respirations 26, blood  pressure 144/83, saturation 92%-95% on room air.  No cyanosis.  HEENT: With dry oral mucosa.  NECK:  Supple.  LUNGS:  With decreased breath sounds in the right base.  HEART:  Sounds S1-S2 tachycardiac.  ABDOMEN: Soft and nontender.  EXTREMITIES:  Left lower extremity is without edema.  Right leg with  trace edema, calf tender to palpation.  Right foot dressed with  a pin in  the big toe present.  Left leg normal, pulses normal.  NEUROLOGIC:  He is alert, oriented, and cooperative.  He is anxious.  Cranial nerves II-XII nonfocal.  Deep tendon reflexes within normal  limits.   LABS:  WBC 9.5, hemoglobin 17.0.  Potassium 4.0, glucose 131, sodium  135, creatinine 0.9.  CT of the chest with angiogram--right lower lobe  DVT with pleural effusions.   ASSESSMENT/PLAN:  1. Probable right leg deep venous thrombosis.  Will get an ultrasound      to confirm.  2. Pulmonary embolism in the right lower lobe.  Will anticoagulate  3. Right-sided chest pain, severe.  Will treat with IV morphine.  4. Anxiety.  Will continue home meds.  5. Status post the right foot surgery by Dr. Charlsie Merles.  He is to have his      pin removed  in 2 weeks.      Georgina Quint. Plotnikov, MD  Electronically Signed     AVP/MEDQ  D:  08/08/2007  T:  08/10/2007  Job:  [id number]   cc:   Billie D. Bean, FNP  Lenn Sink, D.P.M.

## 2011-01-08 NOTE — Assessment & Plan Note (Signed)
[facility]                                 ON-CALL NOTE   [name], [name]                          MRN:          [id number]  DATE:08/11/2007                            DOB:          December 05, 1950    Ronalee Belts called from [phone] at 6:24 p.m. on August 11, 2007,  saying [name] was discharged from the hospital today after a PE and  DVT with warfarin, however was not given any pain medicine.  The patient  was getting Dilaudid and Vicodin in the hospital and was given a shot of  Dilaudid in the hospital before being discharged but no pain medicine  was given to go home with.  I explained we normally do not call in pain  medicine but I would give him to last the next couple of days until he  will be able to get in to see Dr. Hetty Ely.  Vicodin ES, #30, one p.o.  q.6h. p.r.n. was called into the pharmacy.  He is to call Dr. Hetty Ely  for any further medications.     Lelon Perla, DO  Electronically Signed    Shawnie Dapper  DD: 08/11/2007  DT: 08/12/2007  Job #: [id number]   cc:   Arta Silence, MD

## 2011-01-08 NOTE — Discharge Summary (Signed)
[name], [name]                 ACCOUNT NO.:  [id number]   MEDICAL RECORD NO.:  [id number]          PATIENT TYPE:  INP   LOCATION:  3735                         FACILITY:  [facility]   PHYSICIAN:  Valerie A. Felicity Coyer, MDDATE OF BIRTH:  12-Jan-1951   DATE OF ADMISSION:  08/08/2007  DATE OF DISCHARGE:  08/11/2007                               DISCHARGE SUMMARY   DISCHARGE DIAGNOSES:  1. Right lower lobe pulmonary embolus/right lower extremity deep vein      thrombosis.  2. Anxiety.  3. Status post right foot surgery July 21, 2007, performed by Dr.      Charlsie Merles.   HISTORY OF PRESENT ILLNESS:  [name] is a 60 year old white male who  was admitted on August 08, 2007, with chief complaint of chest pain on  the right side as well as right leg pain.  This was accompanied by  shortness of breath and he noted that the pain was worse with breathing  and with movement.  He recently had foot surgery performed on July 21, 2007, and presented to the [facility] Emergency Room where a CT scan  of the chest was noted to be positive for a right lower lobe pulmonary  embolus.  He was admitted for anticoagulation and further evaluation.   PAST MEDICAL HISTORY:  1. Anxiety.  2. History of TIA.  3. History of foot surgery November 25th.   COURSE OF HOSPITALIZATION:  1. Right lower lobe PE/right lower extremity DVT.  The patient was      admitted and was placed on Coumadin and full dose anticoagulation      with IV heparin.  This was later changed to full-dose Lovenox.  INR      at time of discharge is 1.4.  The patient's oxygen saturation is      excellent at 98% on room air.  At this time we plan to discharge      the patient to home with Coumadin as well as full-dose Lovenox      which will need to be continued for a 48-hour overlap of      therapeutic INR.  The patient is scheduled to see [name] on      August 13, 2007.  Also note the patient developed some right      lower  quadrant pain during this admission which was likely referred      pain from PE.  A CT abdomen and pelvis is performed to exclude any      other etiology and this was negative for any acute changes.   MEDICATIONS AT TIME OF DISCHARGE:  1. Zoloft 100 mg p.o. daily.  2. Tranxene 7.5 mg p.o. t.i.d.  3. Coumadin 7.5 mg p.o. daily in the evening.  4. Lovenox 100 mg injection subcu twice daily.   PERTINENT LABORATORIES AT TIME OF DISCHARGE:  Hemoglobin 16.6,  hematocrit 48.2, INR 1.4.   DISPOSITION:  The patient will be discharged to home.   FOLLOWUP:  The patient is scheduled to follow up with [name] on  Thursday, December 18th, at 3:15 p.m.  Philip Craze, NP      [name]. Felicity Coyer, MD  Electronically Signed    MO/MEDQ  D:  08/11/2007  T:  08/11/2007  Job:  [id number]   cc:   Philip D. Bean, FNP

## 2011-01-11 NOTE — H&P (Signed)
NAMEJOHNEL, YIELDING                 ACCOUNT NO.:  [id number]   MEDICAL RECORD NO.:  [id number]          PATIENT TYPE:  INP   LOCATION:  1824                         FACILITY:  [facility]   PHYSICIAN:  Melissa L. Ladona Ridgel, MD  DATE OF BIRTH:  11/21/1950   DATE OF ADMISSION:  05/25/2005  DATE OF DISCHARGE:                                HISTORY & PHYSICAL   CHIEF COMPLAINT:  I think I had a stroke.   PRIMARY CARE PHYSICIAN:  Unassigned.   HISTORY OF PRESENT ILLNESS:  The patient is a 60 year old white male with a  past medical history really for only ruptured disk in the past and evidence  of scoliosis on previous x-rays.  He denies any diabetes or hypertension,  but gives a history that might be consistent with migraine.  The patient  states that today he was traveling over to see a friend and started to  develop tunnel vision with stuttering speech and was unable to name things.  He could not express himself very well and so therefore they brought him to  the hospital.  The patient, in the emergency room, states he feels much,  much better; however, on the initial admission, the emergency room  physicians noted he was unable to name tip of the pen, etc, etc.  At the  time of the examination, the patient appears clear of thought, able to  converse fluently and does express that he is under a lot of stress at home,  expresses depressive symptoms and expresses that he is extremely aggravated  with life.  He is able to recall events of the day and give a formal medical  history.   REVIEW OF SYSTEMS:  He does complain of a left temporal headache with some  nausea only after receiving narcotics; with that headache, he denies any  vomiting.  His weight has been stable.  He denies fevers, chills, chest  pain, dysuria,  or palpitations.  All other review of systems are negative.   PAST MEDICAL HISTORY:  The patient's past medical history, as stated, is a  ruptured disk.   PAST SURGICAL HISTORY:   None.   SOCIAL HISTORY:  He is a Photographer.  He states he smokes about a pack a  day of cigarettes and he has occasional ethanol, although he told me earlier  that he drinks on a daily basis.  He also uses marijuana quite frequently.   FAMILY HISTORY:  Mom is deceased secondary to emphysema and alcohol abuse.  Dad is deceased to an MI; he had scarlet fever and arthritis.   MEDICATIONS:  A multivitamin and sometimes he states he borrows a Tranxene  from one of his friends.   PHYSICAL EXAMINATION:  VITAL SIGNS:  Temperature is 97.7 degrees, blood  pressure 1140/73, pulse is 68, respirations 16.  Saturations are 96%.  GENERAL:  He is in no acute distress.  HEENT:  He is normocephalic, atraumatic.  Pupils are equal, round and  reactive to light.  Extraocular muscles are intact.  He does have some  conjunctival injection on the left, but  no visual loss; his vision appears  to be slightly altered at 20/30 and 20/50 in the left eye, but the patient  clearly can read and describes no pain in the eyeball.  There do not appear  to be any inflammatory changes around the eye.  CHEST:  Clear to auscultation.  There are no rhonchi, rales or wheezes.  CARDIOVASCULAR:  Regular rate and rhythm, positive S1 and S2, no S3 or S4,  no murmurs, rubs, or gallops.  ABDOMEN:  Soft, nontender and non-distended with positive bowel sounds.  EXTREMITIES:  There is no clubbing, cyanosis, or edema.  NEUROLOGIC:  He is awake, alert and oriented.  Cranial nerves II-XII are  intact.  Power is 5/5.  DTRs are 2+.   LABORATORY VALUES:  Sodium 138, potassium 3.8, chloride 107, CO2 of 23, BUN  is 17, creatinine is 1.0.  His glucose is 82.  White count is 6.4,  hemoglobin of 18, hematocrit is 52.9, platelets are 164,000.  His ethanol  level is less than 5.  His point-of-care enzymes are negative x1.  PT/INR of  13.1 and 1, respectively; PTT is 28.  Urine drug screen was positive for  benzodiazepines and THC.    RADIOLOGIC FINDINGS:  Chest x-ray was negative.   CT of head was negative.   ASSESSMENT AND PLAN:  This is a 60 year old white male with a past medical  history for no known medical problems, but he does relate that he  occasionally will have visual changes that appear as constriction of his  visual field down to what he calls a 'C' with flickering colors around the  periphery; he states it lasts 20 minutes and then resolves.  He may have a  headache that follows bitemporally, but usually it involves both eyes.   1.  Stuttering and change in mental status of unclear etiology.      Differential diagnosis is migraine, panic disorder, anxiety versus      transient ischemic attack.  We will check carotid Dopplers, consider an      MRI in the morning if he has any other symptoms and we will treat his      headache with Toradol at this time.  2.  Cardiovascular:  His EKG shows an incomplete right bundle branch block      of unclear etiology or duration.  I will be admitting him to telemetry      to rule out possible dysrhythmia and we will check cardiac enzymes.  We      will do tobacco cessation counseling.  3.  Pulmonary:  No clinical findings.  4.  Gastrointestinal:  Some nausea after morphine, otherwise no complaints.  5.  Genitourinary:  No complaints or issues.  6.  Red eye:  Funduscopic exam appears normal.  He has no hypopyon.  He has      no significant visual deficits.  His visual acuity is slightly decreased      in both eyes, but left greater than right; I suspect that he has mild      conjunctival irritation only.  He does need an ophthalmologic      evaluation, however, for the visual aura which may represent migraine      versus primary ocular problem.  7.  Psychiatric:  The patient has severe anxiety and depression.  He may      consider psychiatric evaluation, although it sounds like he has seen a     psychiatrist in the past with one  of his partners and this is not       something that helped them.      Melissa L. Ladona Ridgel, MD  Electronically Signed     MLT/MEDQ  D:  05/25/2005  T:  05/25/2005  Job:  [id number]

## 2011-05-23 LAB — CBC
HCT: 49.2
Hemoglobin: 17.1 — ABNORMAL HIGH
WBC: 6.4

## 2011-05-23 LAB — DIFFERENTIAL
Eosinophils Relative: 2
Lymphocytes Relative: 40
Lymphs Abs: 2.5
Monocytes Absolute: 0.6
Monocytes Relative: 10

## 2011-05-27 ENCOUNTER — Encounter: Payer: Self-pay | Admitting: Family Medicine

## 2011-05-27 HISTORY — PX: CARDIAC CATHETERIZATION: SHX172

## 2011-05-28 ENCOUNTER — Encounter: Payer: Self-pay | Admitting: Family Medicine

## 2011-05-28 ENCOUNTER — Ambulatory Visit (INDEPENDENT_AMBULATORY_CARE_PROVIDER_SITE_OTHER): Payer: BC Managed Care – PPO | Admitting: Family Medicine

## 2011-05-28 VITALS — BP 126/74 | HR 52 | Temp 98.3°F | Ht 73.0 in | Wt 200.2 lb

## 2011-05-28 DIAGNOSIS — R059 Cough, unspecified: Secondary | ICD-10-CM | POA: Insufficient documentation

## 2011-05-28 DIAGNOSIS — R05 Cough: Secondary | ICD-10-CM

## 2011-05-28 MED ORDER — DOXYCYCLINE HYCLATE 100 MG PO CAPS: 100.0000 mg | ORAL_CAPSULE | Freq: Two times a day (BID) | ORAL | Status: AC

## 2011-05-28 MED ORDER — GUAIFENESIN-CODEINE 100-10 MG/5ML PO SYRP: 5.0000 mL | ORAL_SOLUTION | Freq: Every evening | ORAL | Status: AC | PRN

## 2011-05-28 NOTE — Progress Notes (Signed)
  Subjective:    Patient ID: [name], male    DOB: March 13, 1951, 60 y.o.   MRN: [id number]  HPI CC: PNA?  Took care of friend last week with PNA.  Now with 6d h/o gradual onset of body aches, upper chest tightness, cough productive of green sputum associated with HA.  Chills and congestion, RN, ears clogged.  Taking advil 600mg  bid for thumb bone spur.  Mild abd pain, nausea.  No fevers, tooth pain.  No flu shot yet.  Piano tuner.  HR 52, but stays low per pt.  No smokers at home.  Does smoke pot.  No h/o PNA.  No h/o asthma/COPD.  H/o DVT/PE (2008) completed 1 year coumadin.  States had MI this year 2012.  Hasn't recently seen new PCP.  Review of Systems Per HPI    Objective:   Physical Exam  Nursing note and vitals reviewed. Constitutional: He appears well-developed and well-nourished. No distress.  HENT:  Head: Normocephalic and atraumatic.  Right Ear: Hearing, tympanic membrane, external ear and ear canal normal.  Left Ear: Hearing, tympanic membrane, external ear and ear canal normal.  Nose: Nose normal. No mucosal edema or rhinorrhea.  Mouth/Throat: Uvula is midline, oropharynx is clear and moist and mucous membranes are normal. No oropharyngeal exudate, posterior oropharyngeal edema, posterior oropharyngeal erythema or tonsillar abscesses.  Eyes: Conjunctivae and EOM are normal. Pupils are equal, round, and reactive to light. No scleral icterus.  Neck: Normal range of motion. Neck supple. No thyromegaly present.  Cardiovascular: Normal rate, regular rhythm, normal heart sounds and intact distal pulses.   No murmur heard. Pulmonary/Chest: Effort normal and breath sounds normal. No respiratory distress. He has no wheezes. He has no rales.       Slightly coarse but overall clear  Lymphadenopathy:    He has no cervical adenopathy.  Skin: Skin is warm and dry. No rash noted.      Assessment & Plan:

## 2011-05-28 NOTE — Assessment & Plan Note (Addendum)
6d hx.  Anticipate viral bronchitis, however given chills and progressively worsening instead of improving, will cover with doxycycline. Did not use zpack 2/2 recent MI per pt. Cheratussin for cough.

## 2011-05-28 NOTE — Patient Instructions (Signed)
This sounds like bronchitis, likely viral.  However as getting worse and you have had chills, I'm going to treat you with antibiotic. Take doxycycline twice daily. Push plenty of rest. Take simple mucinex with plenty of fluid. Take cheratussin as needed for cough. If not improving, or fever >101.5, worsening cough, please let us know.

## 2011-05-31 ENCOUNTER — Ambulatory Visit (HOSPITAL_COMMUNITY)
Admission: RE | Admit: 2011-05-31 | Discharge: 2011-05-31 | Disposition: A | Discharge status: Home / Self Care | Payer: BC Managed Care – PPO | Source: Ambulatory Visit | Attending: Cardiovascular Disease | Admitting: Cardiovascular Disease

## 2011-05-31 DIAGNOSIS — I2510 Atherosclerotic heart disease of native coronary artery without angina pectoris: Secondary | ICD-10-CM | POA: Insufficient documentation

## 2011-05-31 DIAGNOSIS — I209 Angina pectoris, unspecified: Secondary | ICD-10-CM | POA: Insufficient documentation

## 2011-05-31 LAB — COMPREHENSIVE METABOLIC PANEL
ALT: 30 U/L (ref 0–53)
AST: 35 U/L (ref 0–37)
CO2: 24 mEq/L (ref 19–32)
Calcium: 9.1 mg/dL (ref 8.4–10.5)
GFR calc non Af Amer: >90 mL/min (ref >90)
Sodium: 139 mEq/L (ref 135–145)

## 2011-05-31 LAB — PROTIME-INR
INR: 0.99 (ref 0.00–1.49)
INR: 1
INR: 1.4
Prothrombin Time: 17 — ABNORMAL HIGH

## 2011-05-31 LAB — CBC
HCT: 48.3 % (ref 39.0–52.0)
Hemoglobin: 17.4 g/dL — ABNORMAL HIGH (ref 13.0–17.0)
MCHC: 34.6
MCV: 93.5
Platelets: 158
RBC: 5.2 MIL/uL (ref 4.22–5.81)
RDW: 13.7 % (ref 11.5–15.5)
RDW: 13
WBC: 4 10*3/uL (ref 4.0–10.5)

## 2011-06-03 LAB — CBC
MCHC: 34.2
MCV: 93.9
Platelets: 160
RDW: 13.2

## 2011-06-03 LAB — PROTIME-INR
INR: 1.1
Prothrombin Time: 13.8
Prothrombin Time: 14

## 2011-06-03 LAB — I-STAT 8, (EC8 V) (CONVERTED LAB)
Glucose, Bld: 131 — ABNORMAL HIGH
Potassium: 4
TCO2: 25
pCO2, Ven: 31.1 — ABNORMAL LOW
pH, Ven: 7.494 — ABNORMAL HIGH

## 2011-06-03 LAB — DIFFERENTIAL
Basophils Absolute: 0
Basophils Relative: 0
Eosinophils Absolute: 0 — ABNORMAL LOW
Monocytes Relative: 9
Neutro Abs: 7.4
Neutrophils Relative %: 78 — ABNORMAL HIGH

## 2011-06-03 LAB — URINALYSIS, ROUTINE W REFLEX MICROSCOPIC
Bilirubin Urine: NEGATIVE
Hgb urine dipstick: NEGATIVE
Nitrite: NEGATIVE
Protein, ur: NEGATIVE
Urobilinogen, UA: 1

## 2011-06-03 LAB — HEPARIN LEVEL (UNFRACTIONATED): Heparin Unfractionated: 0.39

## 2011-06-03 LAB — POCT I-STAT CREATININE: Operator id: 196461

## 2011-06-03 LAB — D-DIMER, QUANTITATIVE: D-Dimer, Quant: 5.02 — ABNORMAL HIGH

## 2011-06-03 NOTE — Cardiovascular Report (Signed)
NAMEAIMAN, Philip              ACCOUNT NO.:  [id number]  MEDICAL RECORD NO.:  [id number]  LOCATION:  [facility]                         FACILITY:  [facility]  PHYSICIAN:  Thurmon Fair, MD     DATE OF BIRTH:  04-Apr-1951  DATE OF PROCEDURE:  05/31/2011 DATE OF DISCHARGE:  05/31/2011                           CARDIAC CATHETERIZATION   PROCEDURES PERFORMED: 1. Left heart catheterization. 2. Selective coronary angiography.  The aortic valve was crossed with a catheter for recording of pressures, but a left ventriculogram was not performed.  REASON FOR THE PROCEDURES:  Angina pectoris and abnormal myocardial perfusion study.  [name] is a 60 year old gentleman with known large inferior wall defect that is mostly fixed, but does show signs of peri-infarct ischemia.  He has recently had problems with unexplained episodes of weakness of flushing and chest discomfort.  After risks and benefits of the procedure were described, the patient provided informed consent and was prepped and draped in the usual sterile fashion.  Local anesthesia with 1% lidocaine was administered to the left and right wrist area.  Using a micropuncture kit, access to the right radial artery was obtained without difficulty.  A Glidewire was used to exchange the Angiocath for a 5-French arterial sheath.  An intra- arterial cocktail of nitroglycerin, verapamil, and lidocaine was administered.  A 5-French TIG catheter was advanced under fluoroscopic guidance to the level of the ascending aorta over a 260 cm 0.035 Versacore guidewire.  The TIG catheter provided excellent cannulation of the left coronary artery.  Multiple angiograms of this vessel were performed.  The same catheter was then used to attempt cannulation of the right coronary ostium without success.  It was therefore exchanged over wire for a 5-French JR-4 catheter, which provided immediate successful cannulation of the right coronary artery.  Angiograms  of the right coronary artery were performed after which the same catheter was used to cross the aortic valve, record intraventricular pressures, and a pullback from the left ventricle to the aorta.  At the end of the procedure, all catheters were removed.  The sheath was also removed and hemostasis was achieved with direct pressure using the radial wrist band.  No immediate complications occurred.  FINDINGS: 1. The left coronary artery is a large vessel that is free of     significant atherosclerosis.  It trifurcates into LAD artery.  A     fairly large and bifurcating ramus intermedius artery and a large     left circumflex coronary artery that was nonetheless nondominant. 2. The left anterior descending artery generates only one major     diagonal branch.  This appears to have a smooth 30-40% ostial     stenosis, but otherwise no significant lesions are seen in the LAD     system.  Slow flow is seen in all of the coronary arteries. 3. The ramus intermedius artery is fairly large, bifurcates and     supplies a large part of the anterolateral wall.  It is free of     significant atherosclerosis. 4. The left circumflex coronary artery generates a very large proximal     OM vessel and a much smaller distal OM vessel.  Again,  note is made     of slow arterial flow, but there are no significant atherosclerotic     lesions. 5. The right coronary artery is a dominant but fairly small vessel     that bifurcates into a short PDA and fairly short posterolateral     ventricular artery.  There is evidence of luminal irregularities     throughout the proximal two-thirds of the vessel, but generating no     more than a 20-30% obstruction in the midportion at the level of     the major RV marginal branch.  Again noted is slow flow in all of     the branches of the right coronary system. 6. Left ventricular end-diastolic pressure is normal at 12 mmHg.     There is no evidence of gradient by  pullback from the left     ventricle to the ascending aorta.  By echocardiography, the patient     has normal left ventricular systolic function.  CONCLUSION:  Despite the presence of a very dense and well-defined nuclear perfusion abnormality on two separate nuclear stress tests, [name] has no evidence of meaningful obstruction of the right coronary system.  In fact, no meaningful obstruction is seen in any of the coronary vessels.  Only mild atherosclerosis is noted.  However, there is evidence of slow flow suggestive of microvascular disease in both the left and right coronary systems.  Medical therapy is indicated.  He will have a 14-day event monitor placed to try to see if there is an arrhythmic cause of his symptomatic episodes.     Thurmon Fair, MD     MC/MEDQ  D:  05/31/2011  T:  05/31/2011  Job:  [id number]  cc:   [facility]  Electronically Signed by Thurmon Fair M.D. on 06/03/2011 03:34:58 PM

## 2011-06-29 ENCOUNTER — Encounter: Payer: Self-pay | Admitting: Family Medicine

## 2011-06-29 DIAGNOSIS — R079 Chest pain, unspecified: Secondary | ICD-10-CM | POA: Insufficient documentation

## 2011-06-29 DIAGNOSIS — E7800 Pure hypercholesterolemia, unspecified: Secondary | ICD-10-CM | POA: Insufficient documentation

## 2011-07-03 ENCOUNTER — Other Ambulatory Visit: Payer: Self-pay | Admitting: Urology

## 2011-07-04 ENCOUNTER — Encounter (HOSPITAL_COMMUNITY): Payer: Self-pay | Admitting: Pharmacy Technician

## 2011-07-04 ENCOUNTER — Other Ambulatory Visit: Payer: Self-pay | Admitting: Urology

## 2011-07-05 ENCOUNTER — Other Ambulatory Visit: Payer: Self-pay | Admitting: Urology

## 2011-07-07 ENCOUNTER — Encounter: Payer: Self-pay | Admitting: Family Medicine

## 2011-07-08 ENCOUNTER — Encounter (HOSPITAL_COMMUNITY): Payer: Self-pay

## 2011-07-08 ENCOUNTER — Ambulatory Visit (HOSPITAL_COMMUNITY)
Admission: RE | Admit: 2011-07-08 | Discharge: 2011-07-08 | Disposition: A | Discharge status: Home / Self Care | Payer: BC Managed Care – PPO | Source: Ambulatory Visit | Attending: Urology | Admitting: Urology

## 2011-07-08 ENCOUNTER — Other Ambulatory Visit: Payer: Self-pay

## 2011-07-08 ENCOUNTER — Encounter (HOSPITAL_COMMUNITY): Payer: BC Managed Care – PPO

## 2011-07-08 DIAGNOSIS — Z01818 Encounter for other preprocedural examination: Secondary | ICD-10-CM | POA: Insufficient documentation

## 2011-07-08 DIAGNOSIS — Z01812 Encounter for preprocedural laboratory examination: Secondary | ICD-10-CM | POA: Insufficient documentation

## 2011-07-08 LAB — CBC
HCT: 47.7 % (ref 39.0–52.0)
Hemoglobin: 16.4 g/dL (ref 13.0–17.0)
MCH: 32.9 pg (ref 26.0–34.0)
MCHC: 34.4 g/dL (ref 30.0–36.0)

## 2011-07-08 LAB — BASIC METABOLIC PANEL
BUN: 16 mg/dL (ref 6–23)
Calcium: 9 mg/dL (ref 8.4–10.5)
Creatinine, Ser: 0.87 mg/dL (ref 0.50–1.35)
GFR calc non Af Amer: >90 mL/min (ref >90)
Glucose, Bld: 73 mg/dL (ref 70–99)

## 2011-07-08 NOTE — Patient Instructions (Signed)
[address] Vint Pola  07/08/2011   Your procedure is scheduled on:  Fri. 07/12/2011  Report to [facility] at 0530 AM.  Call this number if you have problems the morning of surgery: [phone]   Remember:   Do not eat food:After Midnight.  Do not drink clear liquids: After Midnight.  Take these medicines the morning of surgery with A SIP OF WATER: none   Do not wear jewelry, make-up or nail polish.  Do not wear lotions, powders, or perfumes.   Do not shave 48 hours prior to surgery.  Do not bring valuables to the hospital.  Contacts, dentures or bridgework may not be worn into surgery.  Leave suitcase in the car. After surgery it may be brought to your room.  For patients admitted to the hospital, checkout time is 11:00 AM the day of discharge.   Patients discharged the day of surgery will not be allowed to drive home.  Name and phone number of your driver: Fredrik Cove [phone]  Special Instructions: CHG Shower Use Special Wash: 1/2 bottle night before surgery and 1/2 bottle morning of surgery.   Please read over the following fact sheets that you were given: MRSA Information

## 2011-07-11 MED ORDER — CEFAZOLIN SODIUM-DEXTROSE 2-3 GM-% IV SOLR
2.0000 g | INTRAVENOUS | Status: AC
  Administered 2011-07-12: 2 g via INTRAVENOUS
  Filled 2011-07-11: qty 50

## 2011-07-12 ENCOUNTER — Encounter (HOSPITAL_COMMUNITY): Payer: Self-pay | Admitting: Anesthesiology

## 2011-07-12 ENCOUNTER — Ambulatory Visit (HOSPITAL_COMMUNITY): Payer: BC Managed Care – PPO | Admitting: Anesthesiology

## 2011-07-12 ENCOUNTER — Encounter (HOSPITAL_COMMUNITY)
Admission: RE | Disposition: A | Discharge status: Home / Self Care | Payer: Self-pay | Source: Ambulatory Visit | Attending: Urology

## 2011-07-12 ENCOUNTER — Ambulatory Visit (HOSPITAL_COMMUNITY)
Admission: RE | Admit: 2011-07-12 | Discharge: 2011-07-12 | Disposition: A | Discharge status: Home / Self Care | Payer: BC Managed Care – PPO | Source: Ambulatory Visit | Attending: Urology | Admitting: Urology

## 2011-07-12 ENCOUNTER — Other Ambulatory Visit: Payer: Self-pay | Admitting: Urology

## 2011-07-12 ENCOUNTER — Encounter (HOSPITAL_COMMUNITY): Payer: Self-pay | Admitting: *Deleted

## 2011-07-12 DIAGNOSIS — C672 Malignant neoplasm of lateral wall of bladder: Secondary | ICD-10-CM | POA: Insufficient documentation

## 2011-07-12 DIAGNOSIS — Z8673 Personal history of transient ischemic attack (TIA), and cerebral infarction without residual deficits: Secondary | ICD-10-CM | POA: Insufficient documentation

## 2011-07-12 DIAGNOSIS — M129 Arthropathy, unspecified: Secondary | ICD-10-CM | POA: Insufficient documentation

## 2011-07-12 DIAGNOSIS — Z7982 Long term (current) use of aspirin: Secondary | ICD-10-CM | POA: Insufficient documentation

## 2011-07-12 DIAGNOSIS — Z87891 Personal history of nicotine dependence: Secondary | ICD-10-CM | POA: Insufficient documentation

## 2011-07-12 DIAGNOSIS — Z79899 Other long term (current) drug therapy: Secondary | ICD-10-CM | POA: Insufficient documentation

## 2011-07-12 DIAGNOSIS — I2510 Atherosclerotic heart disease of native coronary artery without angina pectoris: Secondary | ICD-10-CM | POA: Insufficient documentation

## 2011-07-12 DIAGNOSIS — Z86718 Personal history of other venous thrombosis and embolism: Secondary | ICD-10-CM | POA: Insufficient documentation

## 2011-07-12 HISTORY — PX: CYSTOSCOPY: SHX5120

## 2011-07-12 HISTORY — PX: TRANSURETHRAL RESECTION OF BLADDER TUMOR: SHX2575

## 2011-07-12 SURGERY — CYSTOSCOPY
Anesthesia: General

## 2011-07-12 MED ORDER — MIDAZOLAM HCL 5 MG/5ML IJ SOLN
INTRAMUSCULAR | Status: DC | PRN
  Administered 2011-07-12: 2 mg via INTRAVENOUS

## 2011-07-12 MED ORDER — FENTANYL CITRATE 0.05 MG/ML IJ SOLN
INTRAMUSCULAR | Status: DC | PRN
  Administered 2011-07-12 (×3): 50 ug via INTRAVENOUS

## 2011-07-12 MED ORDER — ASPIRIN 81 MG PO CHEW
CHEWABLE_TABLET | ORAL | Status: DC | PRN
  Administered 2011-07-12: 81 mg via ORAL

## 2011-07-12 MED ORDER — EPHEDRINE SULFATE 50 MG/ML IJ SOLN
INTRAMUSCULAR | Status: DC | PRN
  Administered 2011-07-12 (×2): 5 mg via INTRAVENOUS

## 2011-07-12 MED ORDER — SODIUM CHLORIDE 0.9 % IR SOLN
Status: DC | PRN
  Administered 2011-07-12: 1000 mL

## 2011-07-12 MED ORDER — LIDOCAINE HCL 1 % IJ SOLN
INTRAMUSCULAR | Status: DC | PRN
  Administered 2011-07-12: 100 mg via INTRADERMAL

## 2011-07-12 MED ORDER — FENTANYL CITRATE 0.05 MG/ML IJ SOLN: 25.0000 ug | INTRAMUSCULAR | Status: DC | PRN

## 2011-07-12 MED ORDER — LACTATED RINGERS IV SOLN
INTRAVENOUS | Status: DC | PRN
  Administered 2011-07-12: 07:00:00 via INTRAVENOUS

## 2011-07-12 MED ORDER — GLYCINE 1.5 % IR SOLN
Status: DC | PRN
  Administered 2011-07-12: 3000 mL

## 2011-07-12 MED ORDER — PROPOFOL 10 MG/ML IV EMUL
INTRAVENOUS | Status: DC | PRN
  Administered 2011-07-12: 200 mg via INTRAVENOUS

## 2011-07-12 MED ORDER — ASPIRIN 81 MG PO CHEW
81.0000 mg | CHEWABLE_TABLET | Freq: Once | ORAL | Status: AC
  Administered 2011-07-12: 81 mg via ORAL
  Filled 2011-07-12: qty 1

## 2011-07-12 MED ORDER — LACTATED RINGERS IV SOLN
INTRAVENOUS | Status: DC
  Administered 2011-07-12: 10:00:00 via INTRAVENOUS
  Administered 2011-07-12: 1000 mL via INTRAVENOUS

## 2011-07-12 MED ORDER — PROMETHAZINE HCL 25 MG/ML IJ SOLN: 6.2500 mg | INTRAMUSCULAR | Status: DC | PRN

## 2011-07-12 MED ORDER — CEFAZOLIN SODIUM-DEXTROSE 2-3 GM-% IV SOLR
INTRAVENOUS | Status: AC
  Filled 2011-07-12: qty 50

## 2011-07-12 MED ORDER — ASPIRIN 81 MG PO CHEW: 81.0000 mg | CHEWABLE_TABLET | Freq: Once | ORAL | Status: DC

## 2011-07-12 MED ORDER — ONDANSETRON HCL 4 MG/2ML IJ SOLN
INTRAMUSCULAR | Status: DC | PRN
  Administered 2011-07-12: 4 mg via INTRAVENOUS

## 2011-07-12 SURGICAL SUPPLY — 22 items
BAG URINE DRAINAGE (UROLOGICAL SUPPLIES) IMPLANT
BAG URO CATCHER STRL LF (DRAPE) ×2 IMPLANT
CATH ROBINSON RED A/P 16FR (CATHETERS) IMPLANT
CLOTH BEACON ORANGE TIMEOUT ST (SAFETY) ×2 IMPLANT
DRAPE CAMERA CLOSED 9X96 (DRAPES) ×2 IMPLANT
ELECT REM PT RETURN 9FT ADLT (ELECTROSURGICAL) ×2
ELECTRODE KNIFE URO 27FR PED (UROLOGICAL SUPPLIES) ×1 IMPLANT
ELECTRODE REM PT RTRN 9FT ADLT (ELECTROSURGICAL) ×1 IMPLANT
GLOVE BIOGEL M 7.0 STRL (GLOVE) ×2 IMPLANT
GLYCINE 1.5% IRRIG UROMATIC (IV SOLUTION) ×2 IMPLANT
GOWN PREVENTION PLUS XLARGE (GOWN DISPOSABLE) ×2 IMPLANT
GOWN STRL NON-REIN LRG LVL3 (GOWN DISPOSABLE) ×4 IMPLANT
JUMPSUIT BLUE BOOT COVER DISP (PROTECTIVE WEAR) ×2 IMPLANT
KIT ASPIRATION TUBING (SET/KITS/TRAYS/PACK) IMPLANT
KNIFE COLLINS 24FR (ELECTROSURGICAL) IMPLANT
LOOPS RESECTOSCOPE DISP (ELECTROSURGICAL) ×2 IMPLANT
MANIFOLD NEPTUNE II (INSTRUMENTS) ×2 IMPLANT
MARKER SKIN DUAL TIP RULER LAB (MISCELLANEOUS) IMPLANT
NS IRRIG 1000ML POUR BTL (IV SOLUTION) ×2 IMPLANT
PACK CYSTO (CUSTOM PROCEDURE TRAY) ×2 IMPLANT
SYRINGE IRR TOOMEY STRL 70CC (SYRINGE) ×2 IMPLANT
TUBING CONNECTING 10 (TUBING) ×2 IMPLANT

## 2011-07-12 NOTE — H&P (Signed)
[name] is an 60 y.o. male.    HPI: [name] had TURBT on 06/19/10.  He was then treated with BCG.  He did not return for his surveillance cystoscopy in July.  Cysto on 11/2 showed recurrent bladder tumor.  He is scheduled for TUR recurrent bladder tumor.  Past Medical History  Diagnosis Date  . Anxiety   . History of DVT (deep vein thrombosis) 11/08    previously on coumadin  . TIA (transient ischemic attack) 2008  . Alcohol abuse, continuous   . Personal history of other disorder of urinary system   . Internal hemorrhoids without mention of complication   . Other abnormal glucose   . Inguinal hernia without mention of obstruction or gangrene, unilateral or unspecified, (not specified as recurrent)   . Other pulmonary embolism and infarction   . Thrombocytopenia, unspecified   . Bladder cancer 2011    Dr. Brunilda Payor  . Depression   . Stroke 05/25/2005    TIA  . Arthritis     all over  . CAD (coronary artery disease) 2012    MI    Past Surgical History  Procedure Date  . Shoulder surgery 03/08/08    Left; arthroscopy  . Hernia repair 8/11    repair of right indirect and direct hernias; incarerated umbilical hernia containing preperitoneal fat  . Cardiac catheterization 05/2011  . Tonsillectomy     as child    Medications Prior to Admission  Medication Dose Route Frequency Provider Last Rate Last Dose  . ceFAZolin (ANCEF) IVPB 2 g/50 mL premix  2 g Intravenous On Call to OR Lindaann Slough, MD      . lactated ringers infusion   Intravenous Continuous Gaetano Hawthorne, MD 100 mL/hr at 07/12/11 0717 1,000 mL at 07/12/11 0717   Medications Prior to Admission  Medication Sig Dispense Refill  . aspirin 81 MG tablet Take 81 mg by mouth every morning.       . clorazepate (TRANXENE) 7.5 MG tablet Take 7.5 mg by mouth at bedtime.       . Multiple Vitamin (MULTIVITAMIN) tablet Take 1 tablet by mouth daily.       . rosuvastatin (CRESTOR) 10 MG tablet Take 10 mg by mouth at  bedtime.       . sertraline (ZOLOFT) 100 MG tablet Take 200 mg by mouth at bedtime.         Allergies:  Allergies  Allergen Reactions  . Benzonatate     REACTION: unspecified- pt. Has no known knowledge of allergy to this!  . Escitalopram Oxalate     REACTION: unknown-pt. Has no known knowledge of allergy to this !  . Latex Other (See Comments)    Blood in urine  . Sulfonamide Derivatives Rash    Family History  Problem Relation Age of Onset  . Emphysema Mother   . Other Father     Hypotension from medication causing GI bleed    Social History:  reports that he quit smoking about 6 years ago. His smoking use included Cigarettes. He has a 37.5 pack-year smoking history. He does not have any smokeless tobacco history on file. He reports that he uses illicit drugs (Marijuana) about twice per week. He reports that he does not drink alcohol.  Review of Systems: Pertinent items are noted in HPI. A comprehensive review of systems was negative except as noted in the HPI.  No results found for this or any previous visit (from the past 48 hour(s)).  No results found.  Temp:  [97.4 F (36.3 C)] 97.4 F (36.3 C) (11/16 0529) Pulse Rate:  [55] 55  (11/16 0529) Resp:  [16] 16  (11/16 0529) BP: (117)/(81) 117/81 mmHg (11/16 0529) SpO2:  [94 %] 94 % (11/16 0529)  Physical Exam: General appearance: alert and appears stated age Head: Normocephalic, without obvious abnormality, atraumatic Eyes: conjunctivae/corneas clear. EOM's intact.  Oropharynx: moist mucous membranes Neck: supple, symmetrical, trachea midline Resp: normal respiratory effort Cardio: regular rate and rhythm Back: symmetric, no curvature. ROM normal. No CVA tenderness. GI: soft, non-tender; bowel sounds normal; no masses,  no organomegaly Male genitalia: penis: normal male phallus with no lesions or discharge.Testes: bilaterally descended with no masses or tenderness. no hernias Pelvic: deferred Extremities:  extremities normal, atraumatic, no cyanosis or edema Skin: Skin color normal. No visible rashes or lesions Neurologic: Grossly normal  Assessment/Plan Recurrent bladder tumor. Coronary artery disease.  Plan: TURBT.  Odilia Damico-HENRY 07/12/2011, 7:37 AM

## 2011-07-12 NOTE — Op Note (Signed)
[name] is a 60 y.o.   07/12/2011  General  Surgeon: Wendie Simmer. Kambra Beachem  PROCEDURE: Cystoscopy.  TURBT, Random bladder biopsy.   Indication: The patient is a 60 years old who had TURBT a year ago.  He had intravesical BCG.  He missed his first surveillance cystoscopy in July.  Cystoscopy about 2 weeks ago showed a small recurrent tumor on the left lateral wall of the bladder.  He is scheduled for TUR recurrent bladder tumor.  The patient was identified by his wrist band and proper time out was taken.  Under general anesthesia the patient was prepped and draped and placed in the dorsolithotomy position.  A panendoscope was inserted in the bladder.  The anterior urethra is normal.  There is moderate prostatic hypertrophy.  There is a 2 cm papillary tumor in the left lateral wall of the bladder.  The tumor is at the base of the bladder lateral and distal to the ureteral orifice.  The cystoscope was removed.  The urethra was sequentially dilated up to # 30 Fr.  Then a # 28 Iglesias resectoscope was inserted in the bladder.  Resection of the bladder tumor was done.  Hemostasis was secured with electrocautery.  The base of the bladder tumor was fulgurated as well as a 2 cm resection margin.  The resectoscope was then removed.  The cystoscope was reinserted in the bladder.  Random bladder biopsy was done with the cold cup.  The cystoscope was removed.  The resectoscope was reinserted in the bladder.  Fulguration of the areas of biopsy was then done.  There was no evidence of bleeding at the end of the procedure.  1 ampule of xylocaine jelly was then instilled in the urethra.  The patient tolerated the procedure well and left the OR in satisfactory condition to PACU.

## 2011-07-12 NOTE — Anesthesia Postprocedure Evaluation (Signed)
  Anesthesia Post-op Note  Patient: [name]  Procedure(s) Performed:  CYSTOSCOPY - one hour being requested for this case; TRANSURETHRAL RESECTION OF BLADDER TUMOR (TURBT)  Patient Location: PACU  Anesthesia Type: General  Level of Consciousness: awake and alert   Airway and Oxygen Therapy: Patient Spontanous Breathing  Post-op Pain: mild  Post-op Assessment: Post-op Vital signs reviewed, Patient's Cardiovascular Status Stable, Respiratory Function Stable, Patent Airway and No signs of Nausea or vomiting  Post-op Vital Signs: stable  Complications: No apparent anesthesia complications

## 2011-07-12 NOTE — Progress Notes (Signed)
No open wound had cystoscopy TURB

## 2011-07-12 NOTE — Transfer of Care (Signed)
Immediate Anesthesia Transfer of Care Note  Patient: [name]  Procedure(s) Performed:  CYSTOSCOPY - one hour being requested for this case; TRANSURETHRAL RESECTION OF BLADDER TUMOR (TURBT)  Patient Location: PACU  Anesthesia Type: General  Level of Consciousness: awake, alert , oriented, patient cooperative and responds to stimulation  Airway & Oxygen Therapy: Patient Spontanous Breathing and Patient connected to face mask oxygen  Post-op Assessment: Report given to PACU RN and Post -op Vital signs reviewed and stable  Post vital signs: stable  Complications: No apparent anesthesia complications

## 2011-07-12 NOTE — Anesthesia Procedure Notes (Addendum)
Performed by: Anastasio Champion E   Procedure Name: LMA Insertion Date/Time: 07/12/2011 8:07 AM Performed by: Illene Silver Pre-anesthesia Checklist: Patient identified, Emergency Drugs available, Suction available and Patient being monitored Patient Re-evaluated:Patient Re-evaluated prior to inductionOxygen Delivery Method: Circle System Utilized Preoxygenation: Pre-oxygenation with 100% oxygen Intubation Type: IV induction Ventilation: Mask ventilation without difficulty LMA: LMA with gastric port inserted LMA Size: 5.0 Grade View: Grade II Tube type: Oral Number of attempts: 1

## 2011-07-12 NOTE — Anesthesia Preprocedure Evaluation (Addendum)
Anesthesia Evaluation  Patient identified by MRN, date of birth, ID band Patient awake    Reviewed: Allergy & Precautions, H&P , NPO status , Patient's Chart, lab work & pertinent test results, reviewed documented beta blocker date and time   Airway Mallampati: II TM Distance: >3 FB Neck ROM: full    Dental No notable dental hx. (+) Dental Advidsory Given and Teeth Intact   Pulmonary neg pulmonary ROS,  History PE clear to auscultation  Pulmonary exam normal       Cardiovascular Exercise Tolerance: Good + CAD and neg cardio ROS regular Normal Has had episodes of severe CP.  Has had MI in past.  Had suspicious nuclear stress test.  Cath last month showed CAD but not obstructive.  However, has microvascular disease.   Neuro/Psych tia 2006.  Nothing since TIANegative Neurological ROS  Negative Psych ROS   GI/Hepatic negative GI ROS, Neg liver ROS, (+)     substance abuse  alcohol use,   Endo/Other  Negative Endocrine ROS  Renal/GU negative Renal ROS  Genitourinary negative   Musculoskeletal   Abdominal   Peds  Hematology negative hematology ROS (+) thrombocytopenia   Anesthesia Other Findings   Reproductive/Obstetrics negative OB ROS                      Anesthesia Physical Anesthesia Plan  ASA: II  Anesthesia Plan: General   Post-op Pain Management:    Induction: Intravenous  Airway Management Planned: LMA  Additional Equipment:   Intra-op Plan:   Post-operative Plan:   Informed Consent: I have reviewed the patients History and Physical, chart, labs and discussed the procedure including the risks, benefits and alternatives for the proposed anesthesia with the patient or authorized representative who has indicated his/her understanding and acceptance.   Dental Advisory Given  Plan Discussed with: CRNA and Surgeon  Anesthesia Plan Comments:        Anesthesia Quick  Evaluation

## 2011-07-12 NOTE — Preoperative (Signed)
Beta Blockers   Reason not to administer Beta Blockers:Not Applicable 

## 2011-07-15 ENCOUNTER — Encounter (HOSPITAL_COMMUNITY): Payer: Self-pay | Admitting: Urology

## 2011-08-12 ENCOUNTER — Encounter: Payer: Self-pay | Admitting: Family Medicine

## 2011-09-04 ENCOUNTER — Telehealth: Payer: Self-pay | Admitting: Internal Medicine

## 2011-09-04 ENCOUNTER — Telehealth (INDEPENDENT_AMBULATORY_CARE_PROVIDER_SITE_OTHER): Payer: Self-pay

## 2011-09-04 NOTE — Telephone Encounter (Signed)
Patient called and stated he is having abdominal pain and is concerned it could be a hernia or it might be that he has taken too much Advil which he stopped 10 days ago.  Please advise

## 2011-09-04 NOTE — Telephone Encounter (Signed)
Please call and get more details

## 2011-09-04 NOTE — Telephone Encounter (Signed)
Pt states he has been having upper abd pain, without bulge,nausea,fever  or any other gi symptoms. Pt states it feels like someone has punched in in the stomach. Pt states not sure if he has made himself ill eating cookie dough. Pt advised to see his PCP to have this symptom evaluated.  He states he will call him now.

## 2011-09-04 NOTE — Telephone Encounter (Signed)
LMOVM of home and cell phone numbers to return call.

## 2011-09-05 NOTE — Telephone Encounter (Signed)
Was not able to reach patient again today at either home or cell number.  LMOVM at home number.

## 2012-01-16 ENCOUNTER — Encounter: Payer: Self-pay | Admitting: Family Medicine

## 2012-01-16 ENCOUNTER — Ambulatory Visit (INDEPENDENT_AMBULATORY_CARE_PROVIDER_SITE_OTHER): Payer: BC Managed Care – PPO | Admitting: Family Medicine

## 2012-01-16 ENCOUNTER — Encounter: Payer: Self-pay | Admitting: Neurology

## 2012-01-16 VITALS — BP 108/64 | HR 52 | Temp 98.2°F | Wt 211.8 lb

## 2012-01-16 DIAGNOSIS — R251 Tremor, unspecified: Secondary | ICD-10-CM

## 2012-01-16 DIAGNOSIS — G250 Essential tremor: Secondary | ICD-10-CM

## 2012-01-16 DIAGNOSIS — M79609 Pain in unspecified limb: Secondary | ICD-10-CM

## 2012-01-16 DIAGNOSIS — E7800 Pure hypercholesterolemia, unspecified: Secondary | ICD-10-CM

## 2012-01-16 DIAGNOSIS — R259 Unspecified abnormal involuntary movements: Secondary | ICD-10-CM

## 2012-01-16 DIAGNOSIS — M79643 Pain in unspecified hand: Secondary | ICD-10-CM

## 2012-01-16 NOTE — Progress Notes (Signed)
Had seen Dr. Joanne Chars, no recent imaging done.  Numbness and electrical type pain in B thumbs and radiates up the arms.  Pincher grip is affect on B hands at the first and second digits per patient.  H/o CVA/TIA in 2006.  He quit smoking at that point.  His tremor started at that point.  If has gotten worse in the meantime.  He's asking about options.    Cath notes from 2012 reviewed.   He is statin intolerant but o/w no recent med changes.  Had eval a few weeks ago with Dr. Brunilda Payor and he had no bladder cancer detected.   Meds, vitals, and allergies reviewed.   ROS: See HPI.  Otherwise, noncontributory.  nad ncat Mmm rrr ctab S/S wnl for ext x4 but L>R tremor noted.  No focal dec in strength in the hands but pain with persistent flexion at the wrist.  No pain with tapping flexor retinaculum.

## 2012-01-16 NOTE — Patient Instructions (Signed)
See Marion about your referral before you leave today. Take care.  

## 2012-01-17 ENCOUNTER — Encounter: Payer: Self-pay | Admitting: Family Medicine

## 2012-01-17 DIAGNOSIS — G250 Essential tremor: Secondary | ICD-10-CM | POA: Insufficient documentation

## 2012-01-17 NOTE — Assessment & Plan Note (Signed)
I want neuro input.  I didn't start BB as he has low pulse already.  He may have additional carpal tunnel symptoms, and I would like neuro input on utility of nerve conduction studies.  Refer to neuro. D/w pt.

## 2012-01-21 ENCOUNTER — Encounter: Payer: Self-pay | Admitting: Family Medicine

## 2012-03-20 ENCOUNTER — Ambulatory Visit: Payer: BC Managed Care – PPO | Admitting: Neurology

## 2012-08-13 ENCOUNTER — Telehealth: Payer: Self-pay

## 2012-08-13 DIAGNOSIS — E7800 Pure hypercholesterolemia, unspecified: Secondary | ICD-10-CM

## 2012-08-13 NOTE — Telephone Encounter (Signed)
Pt left v/m requesting lab appt for LDL and HDL levels. Does pt need appt to see Dr Para March.Please advise.

## 2012-08-13 NOTE — Telephone Encounter (Signed)
Left voicemail requesting pt to call office, will try to call back 

## 2012-08-13 NOTE — Telephone Encounter (Signed)
Ordered.  Thanks.  No OV yet.

## 2012-08-14 NOTE — Telephone Encounter (Signed)
Left message asking patient to call back

## 2012-08-17 NOTE — Telephone Encounter (Signed)
Left VM tellling patient to phone in for lab appt, no OV needed yet.

## 2012-08-31 ENCOUNTER — Encounter: Payer: Self-pay | Admitting: Family Medicine

## 2012-10-01 ENCOUNTER — Encounter: Payer: Self-pay | Admitting: Family Medicine

## 2012-10-01 ENCOUNTER — Ambulatory Visit (INDEPENDENT_AMBULATORY_CARE_PROVIDER_SITE_OTHER): Payer: BC Managed Care – PPO | Admitting: Family Medicine

## 2012-10-01 VITALS — BP 106/66 | HR 55 | Temp 98.4°F | Wt 210.8 lb

## 2012-10-01 DIAGNOSIS — R4182 Altered mental status, unspecified: Secondary | ICD-10-CM

## 2012-10-01 LAB — CBC WITH DIFFERENTIAL/PLATELET
Basophils Absolute: 0 10*3/uL (ref 0.0–0.1)
Eosinophils Absolute: 0 10*3/uL (ref 0.0–0.7)
Hemoglobin: 17.6 g/dL — ABNORMAL HIGH (ref 13.0–17.0)
Lymphocytes Relative: 22.8 % (ref 12.0–46.0)
Monocytes Relative: 7.2 % (ref 3.0–12.0)
Neutrophils Relative %: 69.2 % (ref 43.0–77.0)
Platelets: 157 10*3/uL (ref 150.0–400.0)
RDW: 13.4 % (ref 11.5–14.6)

## 2012-10-01 LAB — COMPREHENSIVE METABOLIC PANEL
ALT: 28 U/L (ref 0–53)
AST: 27 U/L (ref 0–37)
CO2: 24 mEq/L (ref 19–32)
Calcium: 9.2 mg/dL (ref 8.4–10.5)
Chloride: 105 mEq/L (ref 96–112)
GFR: 93.45 mL/min (ref >60.00)
Potassium: 4.2 mEq/L (ref 3.5–5.1)
Sodium: 135 mEq/L (ref 135–145)
Total Protein: 7.8 g/dL (ref 6.0–8.3)

## 2012-10-01 NOTE — Patient Instructions (Addendum)
Go to the lab on the way out.  We'll contact you with your lab report.  See Shirlee Limerick about your referral before you leave today. We'll be in touch after I get your CT results back.  I would stop using marijuana.

## 2012-10-01 NOTE — Assessment & Plan Note (Signed)
He talks about capitalism, [employer], wars in [place]. He can be redirected but he he appears worried when talking about his symptoms and government, etc. He denies hearing voices. He has no SI/HI.  He has ongoing MJ use.  Denies other illicits, tobacco, etoh.  H/o CVA and h/o etoh use prev.   At this point, no indication for emergent psych eval.  He isn't a danger to himself and has no plans for self harm.  Advised to stop MJ use.  Check routine labs and CT given the h/o etoh and CVA.   If w/u unremarkable, will need to d/w pt re: options moving forward.  He has no findings on exam that I can attribute directly to the clorox exposure, as I would think that any possible issues from that should have resolved by now.   >25 min spent with face to face with patient, >50% counseling and/or coordinating care

## 2012-10-01 NOTE — Progress Notes (Signed)
He clean up an area of dog urine with Clorox, in mid December.  Then he started having chills, HA, feeling dizzy.  He reports then being in bed for 2 days after that "with an ice pack on my head" for the HA.  He still feels dizzy like he has "spun around 6 times" but has no orthostatic sx.  He has trouble concentrating in the meantime, occ feeling confused, some word searching.  No fever, vomiting, diarrhea.  He can walk his dogs w/o SOB.   H/o CVA noted.  He admits to smoking MJ.  Denies recent tob, etoh.   Meds, vitals, and allergies reviewed.   ROS: See HPI.  Otherwise, noncontributory.  nad  Tm wnl Nasal and OP exam wnl Neck supple rrr ctab abd soft Ext w/o edema CN 2-12 wnl B, S/S/DTR wnl x4. Tremor in B hands noted, old finding.  Speech is fluent but he is tangential during the interview.  He talks about capitalism, [employer], wars in [place].  He can be redirected but he he appears worried when talking about his symptoms and government, etc.   He denies hearing voices.  He has no SI/HI.

## 2012-10-02 ENCOUNTER — Ambulatory Visit (INDEPENDENT_AMBULATORY_CARE_PROVIDER_SITE_OTHER)
Admission: RE | Admit: 2012-10-02 | Discharge: 2012-10-02 | Disposition: A | Discharge status: Home / Self Care | Payer: BC Managed Care – PPO | Source: Ambulatory Visit | Attending: Family Medicine | Admitting: Family Medicine

## 2012-10-02 ENCOUNTER — Encounter: Payer: Self-pay | Admitting: *Deleted

## 2012-10-02 DIAGNOSIS — R4182 Altered mental status, unspecified: Secondary | ICD-10-CM

## 2012-10-04 LAB — VITAMIN B1: Vitamin B1 (Thiamine): 8 nmol/L (ref 8–30)

## 2012-11-04 ENCOUNTER — Ambulatory Visit: Payer: BC Managed Care – PPO | Admitting: Family Medicine

## 2012-11-05 ENCOUNTER — Encounter: Payer: Self-pay | Admitting: Family Medicine

## 2012-11-05 ENCOUNTER — Ambulatory Visit: Payer: BC Managed Care – PPO | Admitting: Family Medicine

## 2012-11-05 ENCOUNTER — Ambulatory Visit (INDEPENDENT_AMBULATORY_CARE_PROVIDER_SITE_OTHER): Payer: BC Managed Care – PPO | Admitting: Family Medicine

## 2012-11-05 VITALS — BP 110/68 | HR 80 | Temp 98.4°F

## 2012-11-05 DIAGNOSIS — R059 Cough, unspecified: Secondary | ICD-10-CM

## 2012-11-05 DIAGNOSIS — R4182 Altered mental status, unspecified: Secondary | ICD-10-CM

## 2012-11-05 MED ORDER — DOXYCYCLINE HYCLATE 100 MG PO TABS: 100.0000 mg | ORAL_TABLET | Freq: Two times a day (BID) | ORAL | Status: DC

## 2012-11-05 NOTE — Assessment & Plan Note (Signed)
Likely unrelated to his other complaints stated above, likely an uncomplicated bronchitis w/o inc in WOB.  Would use doxycycline, supportive tx o/w and f/u prn for this.

## 2012-11-05 NOTE — Patient Instructions (Addendum)
Start the antibiotics today and I'll call over to [facility] office.  Take care.  The cough should improve gradually.

## 2012-11-05 NOTE — Progress Notes (Signed)
We reviewed this last OV.  He has had f/u with Valinda Hoar about his worries.  "I have a cognitive problem" and he associates it with the Clorox exposure.  He cut out smoking MJ in the interval.  No etoh since 2008.  Not smoking cigarettes.   I am interested in him following up with Baker and he agrees with this. He gave me permission to talk with her.  He apologized for his prev conversation here in the clinic.  He continues to be worried about 911 and war casualties.  URI sx. duration of symptoms: about 6 days HA, on top of the head Rhinorrhea: yes, prev Congestion: yes ear pain: no; ringing in ears is at baseline sore throat: yes Cough: yes, with sputum.  Cough disrupting sleep.   Myalgias:chest wall pain with cough Sx have waxed and waned.  Temps have been ~98.4  He has noted a hand tremor.   ROS: See HPI.  Otherwise negative.    Meds, vitals, and allergies reviewed.   GEN: nad, alert and oriented HEENT: mucous membranes moist, TM w/o erythema, nasal epithelium injected, OP with cobblestoning, L max sinus ttp NECK: supple w/o LA CV: rrr. PULM: coarse BS B, L>R with scattered rhonchi but minimal wheeze, no inc wob ABD: soft, +bs EXT: no edema Speech fluent.  Faint tremor in the B hands, episodic.

## 2012-11-05 NOTE — Assessment & Plan Note (Addendum)
He continues to have concerns re: government conspiracy and 911.  I'll ask for staff to contact Baker's office to discuss.  He has no stated Si/Hi. >25 min spent with face to face with patient, >50% counseling and/or coordinating care.

## 2012-11-10 ENCOUNTER — Telehealth: Payer: Self-pay | Admitting: *Deleted

## 2012-11-10 NOTE — Telephone Encounter (Signed)
Message copied by Annamarie Major on Tue Nov 10, 2012  6:35 PM ------      Message from: Annamarie Major      Created: Mon Nov 09, 2012 10:07 AM       LM with receptionist to return call.      ----- Message -----         From: Annamarie Major, CMA         Sent: 11/06/2012   2:49 PM           To: Annamarie Major, CMA            LMOVM to return call.      ----- Message -----         From: Joaquim Nam, MD         Sent: 11/05/2012  10:42 PM           To: Annamarie Major, CMA            Please see about getting Valinda Hoar on the phone, clinic number should be [phone]. I'd like to talk to her about the patient.              ------

## 2012-11-11 ENCOUNTER — Telehealth: Payer: Self-pay | Admitting: Family Medicine

## 2012-11-11 NOTE — Telephone Encounter (Signed)
I talked with [name] about the patient.  She was aware of his concerns about conspiracy, etc.  She will have her office contact him about follow up.  I will defer to her.  I thanked her for her help.

## 2013-02-26 ENCOUNTER — Emergency Department (INDEPENDENT_AMBULATORY_CARE_PROVIDER_SITE_OTHER)
Admission: EM | Admit: 2013-02-26 | Discharge: 2013-02-26 | Disposition: A | Discharge status: Home / Self Care | Payer: BC Managed Care – PPO | Source: Home / Self Care | Attending: Emergency Medicine | Admitting: Emergency Medicine

## 2013-02-26 ENCOUNTER — Encounter (HOSPITAL_COMMUNITY): Payer: Self-pay

## 2013-02-26 DIAGNOSIS — R509 Fever, unspecified: Secondary | ICD-10-CM

## 2013-02-26 LAB — POCT I-STAT, CHEM 8
Calcium, Ion: 1.12 mmol/L — ABNORMAL LOW (ref 1.13–1.30)
Chloride: 107 mEq/L (ref 96–112)
HCT: 50 % (ref 39.0–52.0)
TCO2: 23 mmol/L (ref 0–100)

## 2013-02-26 LAB — CBC WITH DIFFERENTIAL/PLATELET
Basophils Absolute: 0 10*3/uL (ref 0.0–0.1)
Eosinophils Absolute: 0 10*3/uL (ref 0.0–0.7)
HCT: 47.4 % (ref 39.0–52.0)
Hemoglobin: 17 g/dL (ref 13.0–17.0)
Lymphs Abs: 0.7 10*3/uL (ref 0.7–4.0)
MCH: 33.3 pg (ref 26.0–34.0)
MCHC: 35.9 g/dL (ref 30.0–36.0)
Monocytes Absolute: 0.5 10*3/uL (ref 0.1–1.0)
Monocytes Relative: 15 % — ABNORMAL HIGH (ref 3–12)
Neutro Abs: 2.3 10*3/uL (ref 1.7–7.7)
Neutrophils Relative %: 64 % (ref 43–77)
RBC: 5.1 MIL/uL (ref 4.22–5.81)

## 2013-02-26 MED ORDER — DOXYCYCLINE HYCLATE 100 MG PO TABS: 100.0000 mg | ORAL_TABLET | Freq: Two times a day (BID) | ORAL | Status: DC

## 2013-02-26 NOTE — ED Notes (Signed)
Call back number for lab reports verified w patient at release

## 2013-02-26 NOTE — ED Notes (Signed)
States he has had multiple tick and mosquito bites, chiggers, works outside a lot. Has had 2-3 day duration of muscle aches, neck pain , all joints hurt, dizzy, "feels like lighting bolts in my head"; generalized tremors all for past 2-3 days. Decided today he needed to be evaluated , drove himself to the [facility]

## 2013-02-26 NOTE — ED Provider Notes (Signed)
Chief Complaint:   Chief Complaint  Patient presents with  . Headache    History of Present Illness:   [name] is a 62 year old male who presents with a three-day history of a low-grade fever of less than 100, chills, and sweats. He feels achy all over, has had a headache, neck pain, and feels dizzy. Over the past several weeks he's gotten at least 6 tick bites in various places, multiple mosquito bites, and has what he thinks is infected chigger bite on his ankle. He's had slight rhinorrhea, sore throat, dry cough, mild dyspnea, anorexia, and fatigue. He denies any sputum production, chest pain, abdominal pain, nausea, vomiting, diarrhea, or urinary symptoms. No foreign travel.  Review of Systems:  Other than noted above, the patient denies any of the following symptoms. Systemic:  No chills, sweats, fatigue, myalgias, headache, or anorexia. Eye:  No redness, pain or drainage. ENT:  No earache, nasal congestion, rhinorrhea, sinus pressure, or sore throat. No adenopathy or stiff neck. Lungs:  No cough, sputum production, wheezing, shortness of breath.  Cardiovascular:  No chest pain, palpitations, or syncope. GI:  No nausea, vomiting, abdominal pain or diarrhea. GU:  No dysuria, frequency, or hematuria. Skin:  No rash or pruritis.  PMFSH:  Past medical history, family history, social history, meds, and allergies were reviewed. There is no history of recent foreign travel, animal exposure, or suspicious ingestions.  No new medications or vaccination.  He has had issues with a TIA in the past, coronary artery disease, and bladder cancer. Also has had issues with mild paranoia, has concerns about government conspiracy his, and had an episode of inhaling Clorox fumes last December when he states affected his thinking. He takes Zoloft and clorazepate. He also has a history of essential tremor. He is tremor was today but not any more so than usual. He does not have any homicidal or suicidal  ideation.  Physical Exam:   Vital signs:  BP 127/81  Pulse 64  Temp(Src) 98.6 F (37 C) (Oral)  Resp 16  SpO2 96% General:  Alert, in no distress. Eye:  PERRL, full EOMs.  Lids and conjunctivas were normal. ENT:  TMs and canals were normal, without erythema or inflammation.  Nasal mucosa was clear and uncongested, without drainage.  Mucous membranes were moist.  Pharynx was clear, without exudate or drainage.  There were no oral ulcerations or lesions. Neck:  Supple, no adenopathy, tenderness or mass. Thyroid was normal. Lungs:  No respiratory distress.  Lungs were clear to auscultation, without wheezes, rales or rhonchi.  Breath sounds were clear and equal bilaterally. Heart:  Regular rhythm, without gallops, murmers or rubs. Abdomen:  Soft, flat, and non-tender to palpation.  No hepatosplenomagaly or mass. Extremities:  No swelling, erythema, or joint pain to palpation. Psychiatric: He has no hallucinations or delusions, no homicidal or suicidal ideation. Patient states he has not ingested any alcohol since 2008. He is somewhat tremulous, but states this is baseline for him secondary to benign essential tremor. Skin:  Clear, warm, and dry, he has a 1 cm erythematous papule with a small pustule overlying it on his right ankle anteriorly, this is mildly tender to palpation, no other skin lesions.  Labs:   Results for orders placed during the hospital encounter of 02/26/13  CBC WITH DIFFERENTIAL      Result Value Range   WBC 3.5 (*) 4.0 - 10.5 K/uL   RBC 5.10  4.22 - 5.81 MIL/uL   Hemoglobin 17.0  13.0 -  17.0 g/dL   HCT 16.1  09.6 - 04.5 %   MCV 92.9  78.0 - 100.0 fL   MCH 33.3  26.0 - 34.0 pg   MCHC 35.9  30.0 - 36.0 g/dL   RDW 40.9  81.1 - 91.4 %   Platelets 110 (*) 150 - 400 K/uL   Neutrophils Relative % 64  43 - 77 %   Neutro Abs 2.3  1.7 - 7.7 K/uL   Lymphocytes Relative 20  12 - 46 %   Lymphs Abs 0.7  0.7 - 4.0 K/uL   Monocytes Relative 15 (*) 3 - 12 %   Monocytes Absolute  0.5  0.1 - 1.0 K/uL   Eosinophils Relative 0  0 - 5 %   Eosinophils Absolute 0.0  0.0 - 0.7 K/uL   Basophils Relative 1  0 - 1 %   Basophils Absolute 0.0  0.0 - 0.1 K/uL  POCT I-STAT, CHEM 8      Result Value Range   Sodium 139  135 - 145 mEq/L   Potassium 3.8  3.5 - 5.1 mEq/L   Chloride 107  96 - 112 mEq/L   BUN 16  6 - 23 mg/dL   Creatinine, Ser 7.82  0.50 - 1.35 mg/dL   Glucose, Bld 92  70 - 99 mg/dL   Calcium, Ion 9.56 (*) 1.13 - 1.30 mmol/L   TCO2 23  0 - 100 mmol/L   Hemoglobin 17.0  13.0 - 17.0 g/dL   HCT 21.3  08.6 - 57.8 %    IgM serology for [facility] spotted fever was obtained.  Assessment:  The encounter diagnosis was Fever.  Fever of uncertain cause, could be tick borne illness and could be early [facility] spotted fever. Also consider skin infection since he does have a small infected looking pustule on his ankle. He thinks this was due to a chigger bite.  I do not think he has any evidence of substance abuse, withdrawal, or serious psychiatric disease. He does have some mild psychiatric issues consisting mainly of mild paranoia, but I think these are under control right now with his current medication.  Plan:   1.  The following meds were prescribed:   Discharge Medication List as of 02/26/2013  4:22 PM    START taking these medications   Details  !! doxycycline (VIBRA-TABS) 100 MG tablet Take 1 tablet (100 mg total) by mouth 2 (two) times daily., Starting 02/26/2013, Until Discontinued, Normal     !! - Potential duplicate medications found. Please discuss with provider.     2.  The patient was instructed in symptomatic care and handouts were given. 3.  The patient was told to return if becoming worse in any way, for a scheduled recheck in 3 days, and given some red flag symptoms such as worsening fever or for increasing pain that would indicate earlier return. 4.  Follow up here in 3 days. It    Reuben Likes, MD 02/26/13 [phone]

## 2013-02-28 LAB — ROCKY MTN SPOTTED FVR AB, IGM-BLOOD: RMSF IgM: 0.39 IV (ref 0.00–0.89)

## 2013-03-02 NOTE — ED Notes (Signed)
Discussed negative RMSF findings w patient . Advised to complete Rx , and to return to care for changes in condition

## 2013-03-29 ENCOUNTER — Encounter (HOSPITAL_COMMUNITY): Payer: Self-pay

## 2013-03-29 ENCOUNTER — Encounter (HOSPITAL_COMMUNITY): Payer: Self-pay | Admitting: Emergency Medicine

## 2013-03-29 ENCOUNTER — Emergency Department (HOSPITAL_COMMUNITY)
Admission: EM | Admit: 2013-03-29 | Discharge: 2013-03-29 | Disposition: A | Discharge status: Nursing Home (Includes ICF) | Payer: BC Managed Care – PPO | Source: Home / Self Care

## 2013-03-29 ENCOUNTER — Emergency Department (HOSPITAL_COMMUNITY)
Admission: EM | Admit: 2013-03-29 | Discharge: 2013-03-30 | Disposition: A | Discharge status: Home / Self Care | Payer: BC Managed Care – PPO | Attending: Emergency Medicine | Admitting: Emergency Medicine

## 2013-03-29 DIAGNOSIS — Z8673 Personal history of transient ischemic attack (TIA), and cerebral infarction without residual deficits: Secondary | ICD-10-CM | POA: Insufficient documentation

## 2013-03-29 DIAGNOSIS — Z91040 Latex allergy status: Secondary | ICD-10-CM | POA: Insufficient documentation

## 2013-03-29 DIAGNOSIS — Z79899 Other long term (current) drug therapy: Secondary | ICD-10-CM | POA: Insufficient documentation

## 2013-03-29 DIAGNOSIS — Z86718 Personal history of other venous thrombosis and embolism: Secondary | ICD-10-CM | POA: Insufficient documentation

## 2013-03-29 DIAGNOSIS — Z862 Personal history of diseases of the blood and blood-forming organs and certain disorders involving the immune mechanism: Secondary | ICD-10-CM | POA: Insufficient documentation

## 2013-03-29 DIAGNOSIS — M7989 Other specified soft tissue disorders: Secondary | ICD-10-CM | POA: Insufficient documentation

## 2013-03-29 DIAGNOSIS — Z7982 Long term (current) use of aspirin: Secondary | ICD-10-CM | POA: Insufficient documentation

## 2013-03-29 DIAGNOSIS — F411 Generalized anxiety disorder: Secondary | ICD-10-CM | POA: Insufficient documentation

## 2013-03-29 DIAGNOSIS — Z9861 Coronary angioplasty status: Secondary | ICD-10-CM | POA: Insufficient documentation

## 2013-03-29 DIAGNOSIS — Z8679 Personal history of other diseases of the circulatory system: Secondary | ICD-10-CM | POA: Insufficient documentation

## 2013-03-29 DIAGNOSIS — Z87448 Personal history of other diseases of urinary system: Secondary | ICD-10-CM | POA: Insufficient documentation

## 2013-03-29 DIAGNOSIS — F3289 Other specified depressive episodes: Secondary | ICD-10-CM | POA: Insufficient documentation

## 2013-03-29 DIAGNOSIS — Z8551 Personal history of malignant neoplasm of bladder: Secondary | ICD-10-CM | POA: Insufficient documentation

## 2013-03-29 DIAGNOSIS — Z8719 Personal history of other diseases of the digestive system: Secondary | ICD-10-CM | POA: Insufficient documentation

## 2013-03-29 DIAGNOSIS — F329 Major depressive disorder, single episode, unspecified: Secondary | ICD-10-CM | POA: Insufficient documentation

## 2013-03-29 DIAGNOSIS — Z87891 Personal history of nicotine dependence: Secondary | ICD-10-CM | POA: Insufficient documentation

## 2013-03-29 DIAGNOSIS — I82401 Acute embolism and thrombosis of unspecified deep veins of right lower extremity: Secondary | ICD-10-CM

## 2013-03-29 DIAGNOSIS — M129 Arthropathy, unspecified: Secondary | ICD-10-CM | POA: Insufficient documentation

## 2013-03-29 DIAGNOSIS — I2510 Atherosclerotic heart disease of native coronary artery without angina pectoris: Secondary | ICD-10-CM | POA: Insufficient documentation

## 2013-03-29 LAB — CBC WITH DIFFERENTIAL/PLATELET
Basophils Relative: 0 % (ref 0–1)
HCT: 47.6 % (ref 39.0–52.0)
Hemoglobin: 17 g/dL (ref 13.0–17.0)
Lymphocytes Relative: 30 % (ref 12–46)
Monocytes Absolute: 0.6 10*3/uL (ref 0.1–1.0)
Monocytes Relative: 8 % (ref 3–12)
Neutro Abs: 4.2 10*3/uL (ref 1.7–7.7)
Neutrophils Relative %: 59 % (ref 43–77)
RBC: 5.1 MIL/uL (ref 4.22–5.81)
WBC: 7.2 10*3/uL (ref 4.0–10.5)

## 2013-03-29 LAB — PROTIME-INR: INR: 0.97 (ref 0.00–1.49)

## 2013-03-29 LAB — BASIC METABOLIC PANEL
CO2: 27 mEq/L (ref 19–32)
Calcium: 9.5 mg/dL (ref 8.4–10.5)
GFR calc non Af Amer: >90 mL/min (ref >90)
Glucose, Bld: 94 mg/dL (ref 70–99)
Potassium: 3.8 mEq/L (ref 3.5–5.1)
Sodium: 138 mEq/L (ref 135–145)

## 2013-03-29 MED ORDER — ENOXAPARIN SODIUM 100 MG/ML ~~LOC~~ SOLN
100.0000 mg | Freq: Once | SUBCUTANEOUS | Status: AC
  Administered 2013-03-30: 100 mg via SUBCUTANEOUS
  Filled 2013-03-29: qty 1

## 2013-03-29 NOTE — ED Notes (Signed)
Reports swelling of right leg x 2 days and is gradually getting worse. States pain is felt deep in leg. Hx of DVT.  Pt is using support hose and elevating leg with no relief in symptoms. Denies injury.

## 2013-03-29 NOTE — ED Notes (Signed)
Per [facility]: pt coming to ED for further evaluation acute DVT. Pt has hx of DVT and PE

## 2013-03-29 NOTE — ED Provider Notes (Signed)
CSN: [id number]     Arrival date & time 03/29/13  1934 History     None    Chief Complaint  Patient presents with  . Leg Swelling   (Consider location/radiation/quality/duration/timing/severity/associated sxs/prior Treatment) Patient is a 62 y.o. male presenting with leg pain. The history is provided by the patient.  Leg Pain Location:  Leg Time since incident:  3 days Leg location:  R upper leg and R lower leg Pain details:    Quality:  Aching and pressure   Radiates to:  Groin   Severity:  Moderate   Onset quality:  Sudden   Progression:  Worsening Chronicity:  Recurrent Dislocation: no   Risk factors comment:  H/o dvt with PE in right leg., new onset.   Past Medical History  Diagnosis Date  . Anxiety   . History of DVT (deep vein thrombosis) 11/08    previously on coumadin  . TIA (transient ischemic attack) 2008  . Personal history of other disorder of urinary system   . Internal hemorrhoids without mention of complication   . Other abnormal glucose   . Inguinal hernia without mention of obstruction or gangrene, unilateral or unspecified, (not specified as recurrent)   . Thrombocytopenia, unspecified   . Bladder cancer 2011    Dr. Brunilda Payor  . Depression   . Stroke 05/25/2005    TIA  . Arthritis     all over  . CAD (coronary artery disease) 2012    MI  . Alcohol abuse, unspecified     history of   Past Surgical History  Procedure Laterality Date  . Shoulder surgery  03/08/08    Left; arthroscopy  . Hernia repair  8/11    repair of right indirect and direct hernias; incarerated umbilical hernia containing preperitoneal fat  . Cardiac catheterization  05/2011  . Tonsillectomy      as child  . Cystoscopy  07/12/2011    Procedure: CYSTOSCOPY;  Surgeon: Lindaann Slough, MD;  Location: [facility];  Service: Urology;  Laterality: N/A;  one hour being requested for this case  . Transurethral resection of bladder tumor  07/12/2011    Procedure: TRANSURETHRAL RESECTION OF  BLADDER TUMOR (TURBT);  Surgeon: Lindaann Slough, MD;  Location: [facility];  Service: Urology;  Laterality: N/A;   Family History  Problem Relation Age of Onset  . Emphysema Mother   . Other Father     Hypotension from medication causing GI bleed   History  Substance Use Topics  . Smoking status: Former Smoker -- 1.50 packs/day for 25 years    Types: Cigarettes    Quit date: 08/26/2004  . Smokeless tobacco: Not on file  . Alcohol Use: No     Comment: Quit 2008    Review of Systems  Constitutional: Negative.   Respiratory: Negative.   Cardiovascular: Positive for leg swelling. Negative for chest pain and palpitations.  Gastrointestinal: Negative.     Allergies  Crestor; Escitalopram oxalate; Latex; and Sulfonamide derivatives  Home Medications   Current Outpatient Rx  Name  Route  Sig  Dispense  Refill  . aspirin 81 MG tablet   Oral   Take 81 mg by mouth every morning.          . clorazepate (TRANXENE) 7.5 MG tablet   Oral   Take 7.5 mg by mouth 2 (two) times daily.          . sertraline (ZOLOFT) 100 MG tablet   Oral   Take 200 mg by  mouth at bedtime.          Marland Kitchen doxycycline (VIBRA-TABS) 100 MG tablet   Oral   Take 1 tablet (100 mg total) by mouth 2 (two) times daily.   20 tablet   0   . doxycycline (VIBRA-TABS) 100 MG tablet   Oral   Take 1 tablet (100 mg total) by mouth 2 (two) times daily.   20 tablet   0    BP 116/75  Pulse 63  Temp(Src) 98.5 F (36.9 C)  Resp 16  SpO2 98% Physical Exam  Nursing note and vitals reviewed. Constitutional: He is oriented to person, place, and time. He appears well-developed and well-nourished. No distress.  Cardiovascular: Regular rhythm.   Pulmonary/Chest: Breath sounds normal.  Musculoskeletal: He exhibits tenderness.  Swelling entire right leg, diffuse hyperemia, tender right groin, pulse intact.  Neurological: He is alert and oriented to person, place, and time.  Skin: Skin is warm and dry.    ED Course    Procedures (including critical care time)  Labs Reviewed - No data to display No results found. 1. DVT (deep venous thrombosis), right     MDM  Sent for eval of acute dvt right leg, h/o same with PE and leg swelling with groin tenderness.  Linna Hoff, MD 03/29/13 2011

## 2013-03-29 NOTE — ED Notes (Signed)
Elevated leg

## 2013-03-29 NOTE — ED Notes (Signed)
DVT as result of toe surgery (Nov. 2008); subsequent PE

## 2013-03-29 NOTE — ED Notes (Signed)
Pt c/o Right leg swelling starting late Thursday night early Friday morning. Pt reports increase swelling Saturday. Limited movement to his RLE. Pt reports a hx of DVT in 2008 to his Right calf. Pt applied his stocking to his Right leg this afternoon and woke up from a nap and has increase swelling and pain radiating up to his Right leg

## 2013-03-30 ENCOUNTER — Telehealth: Payer: Self-pay | Admitting: Family Medicine

## 2013-03-30 ENCOUNTER — Encounter (HOSPITAL_COMMUNITY): Payer: Self-pay | Admitting: *Deleted

## 2013-03-30 ENCOUNTER — Ambulatory Visit (HOSPITAL_COMMUNITY): Admission: RE | Admit: 2013-03-30 | Payer: BC Managed Care – PPO | Source: Ambulatory Visit

## 2013-03-30 ENCOUNTER — Emergency Department (HOSPITAL_COMMUNITY)
Admission: EM | Admit: 2013-03-30 | Discharge: 2013-03-30 | Disposition: A | Discharge status: Home / Self Care | Payer: BC Managed Care – PPO | Attending: Emergency Medicine | Admitting: Emergency Medicine

## 2013-03-30 DIAGNOSIS — M7989 Other specified soft tissue disorders: Secondary | ICD-10-CM

## 2013-03-30 DIAGNOSIS — F3289 Other specified depressive episodes: Secondary | ICD-10-CM | POA: Insufficient documentation

## 2013-03-30 DIAGNOSIS — Z8739 Personal history of other diseases of the musculoskeletal system and connective tissue: Secondary | ICD-10-CM | POA: Insufficient documentation

## 2013-03-30 DIAGNOSIS — I82401 Acute embolism and thrombosis of unspecified deep veins of right lower extremity: Secondary | ICD-10-CM

## 2013-03-30 DIAGNOSIS — Z8679 Personal history of other diseases of the circulatory system: Secondary | ICD-10-CM | POA: Insufficient documentation

## 2013-03-30 DIAGNOSIS — F329 Major depressive disorder, single episode, unspecified: Secondary | ICD-10-CM | POA: Insufficient documentation

## 2013-03-30 DIAGNOSIS — Z8673 Personal history of transient ischemic attack (TIA), and cerebral infarction without residual deficits: Secondary | ICD-10-CM | POA: Insufficient documentation

## 2013-03-30 DIAGNOSIS — Z8639 Personal history of other endocrine, nutritional and metabolic disease: Secondary | ICD-10-CM | POA: Insufficient documentation

## 2013-03-30 DIAGNOSIS — Z8551 Personal history of malignant neoplasm of bladder: Secondary | ICD-10-CM | POA: Insufficient documentation

## 2013-03-30 DIAGNOSIS — F411 Generalized anxiety disorder: Secondary | ICD-10-CM | POA: Insufficient documentation

## 2013-03-30 DIAGNOSIS — F1010 Alcohol abuse, uncomplicated: Secondary | ICD-10-CM | POA: Insufficient documentation

## 2013-03-30 DIAGNOSIS — Z87448 Personal history of other diseases of urinary system: Secondary | ICD-10-CM | POA: Insufficient documentation

## 2013-03-30 DIAGNOSIS — Z7982 Long term (current) use of aspirin: Secondary | ICD-10-CM | POA: Insufficient documentation

## 2013-03-30 DIAGNOSIS — Z862 Personal history of diseases of the blood and blood-forming organs and certain disorders involving the immune mechanism: Secondary | ICD-10-CM | POA: Insufficient documentation

## 2013-03-30 DIAGNOSIS — I82409 Acute embolism and thrombosis of unspecified deep veins of unspecified lower extremity: Secondary | ICD-10-CM | POA: Insufficient documentation

## 2013-03-30 DIAGNOSIS — Z79899 Other long term (current) drug therapy: Secondary | ICD-10-CM | POA: Insufficient documentation

## 2013-03-30 DIAGNOSIS — M79609 Pain in unspecified limb: Secondary | ICD-10-CM

## 2013-03-30 DIAGNOSIS — I2510 Atherosclerotic heart disease of native coronary artery without angina pectoris: Secondary | ICD-10-CM | POA: Insufficient documentation

## 2013-03-30 DIAGNOSIS — Z8719 Personal history of other diseases of the digestive system: Secondary | ICD-10-CM | POA: Insufficient documentation

## 2013-03-30 DIAGNOSIS — Z87891 Personal history of nicotine dependence: Secondary | ICD-10-CM | POA: Insufficient documentation

## 2013-03-30 LAB — CBC
Hemoglobin: 17.2 g/dL — ABNORMAL HIGH (ref 13.0–17.0)
MCH: 34.1 pg — ABNORMAL HIGH (ref 26.0–34.0)
MCV: 93.1 fL (ref 78.0–100.0)
Platelets: 152 10*3/uL (ref 150–400)
RBC: 5.05 MIL/uL (ref 4.22–5.81)
WBC: 6.1 10*3/uL (ref 4.0–10.5)

## 2013-03-30 MED ORDER — ENOXAPARIN SODIUM 100 MG/ML ~~LOC~~ SOLN: 1.0000 mg/kg | Freq: Two times a day (BID) | SUBCUTANEOUS | Status: DC

## 2013-03-30 MED ORDER — HYDROCODONE-ACETAMINOPHEN 5-325 MG PO TABS: 1.0000 | ORAL_TABLET | ORAL | Status: DC | PRN

## 2013-03-30 MED ORDER — MORPHINE SULFATE 4 MG/ML IJ SOLN
4.0000 mg | Freq: Once | INTRAMUSCULAR | Status: AC
  Administered 2013-03-30: 4 mg via INTRAVENOUS
  Filled 2013-03-30: qty 1

## 2013-03-30 NOTE — Telephone Encounter (Signed)
Call pt.  DVT seen on u/s.  Schedule ER f/u when feasible.  Thanks.

## 2013-03-30 NOTE — ED Provider Notes (Signed)
CSN: [id number]     Arrival date & time 03/29/13  2013 History     First MD Initiated Contact with Patient 03/29/13 2129     Chief Complaint  Patient presents with  . Leg Swelling   (Consider location/radiation/quality/duration/timing/severity/associated sxs/prior Treatment) HPI 62 yo male presents to the ER with complaint of 5 days of increasing swelling and pain to the right leg.  Pain improves with elevation.  Pt reports he has been using leg extenders at the gym recently and had some pain which resolved but then returned last week.  Pt reports past history of DVT, PE after foot surgery in 2008.  Pt no longer on coumadin.  No SOB, no chest pain.  No fevers, chills, redness, warmth.  Pt was seen at urgent care and sent over for further evaluation.  Past Medical History  Diagnosis Date  . Anxiety   . History of DVT (deep vein thrombosis) 11/08    previously on coumadin  . TIA (transient ischemic attack) 2008  . Personal history of other disorder of urinary system   . Internal hemorrhoids without mention of complication   . Other abnormal glucose   . Inguinal hernia without mention of obstruction or gangrene, unilateral or unspecified, (not specified as recurrent)   . Thrombocytopenia, unspecified   . Bladder cancer 2011    Dr. Brunilda Payor  . Depression   . Stroke 05/25/2005    TIA  . Arthritis     all over  . CAD (coronary artery disease) 2012    MI  . Alcohol abuse, unspecified     history of   Past Surgical History  Procedure Laterality Date  . Shoulder surgery  03/08/08    Left; arthroscopy  . Hernia repair  8/11    repair of right indirect and direct hernias; incarerated umbilical hernia containing preperitoneal fat  . Cardiac catheterization  05/2011  . Tonsillectomy      as child  . Cystoscopy  07/12/2011    Procedure: CYSTOSCOPY;  Surgeon: Lindaann Slough, MD;  Location: [facility];  Service: Urology;  Laterality: N/A;  one hour being requested for this case  .  Transurethral resection of bladder tumor  07/12/2011    Procedure: TRANSURETHRAL RESECTION OF BLADDER TUMOR (TURBT);  Surgeon: Lindaann Slough, MD;  Location: [facility];  Service: Urology;  Laterality: N/A;   Family History  Problem Relation Age of Onset  . Emphysema Mother   . Other Father     Hypotension from medication causing GI bleed   History  Substance Use Topics  . Smoking status: Former Smoker -- 1.50 packs/day for 25 years    Types: Cigarettes    Quit date: 08/26/2004  . Smokeless tobacco: Not on file  . Alcohol Use: No     Comment: Quit 2008    Review of Systems  All other systems reviewed and are negative.    Allergies  Crestor; Escitalopram oxalate; Latex; and Sulfonamide derivatives  Home Medications   Current Outpatient Rx  Name  Route  Sig  Dispense  Refill  . aspirin 325 MG EC tablet   Oral   Take 325 mg by mouth daily.         . cholecalciferol (VITAMIN D) 400 UNITS TABS tablet   Oral   Take 400 Units by mouth daily.         . clorazepate (TRANXENE) 7.5 MG tablet   Oral   Take 7.5 mg by mouth at bedtime.         Marland Kitchen  clorazepate (TRANXENE) 7.5 MG tablet   Oral   Take 3.75 mg by mouth daily as needed for anxiety.         Marland Kitchen ibuprofen (ADVIL,MOTRIN) 200 MG tablet   Oral   Take 200 mg by mouth every 6 (six) hours as needed for pain.         . Methylsulfonylmethane (MSM PO)   Oral   Take 1 capsule by mouth daily.         . Multiple Vitamins-Minerals (MENS MULTI VITAMIN & MINERAL PO)   Oral   Take 1 tablet by mouth daily.         . sertraline (ZOLOFT) 100 MG tablet   Oral   Take 100 mg by mouth at bedtime.          BP 126/84  Pulse 53  Temp(Src) 97.7 F (36.5 C) (Oral)  Resp 16  SpO2 96% Physical Exam  Nursing note and vitals reviewed. Constitutional: He is oriented to person, place, and time. He appears well-developed and well-nourished.  HENT:  Head: Normocephalic and atraumatic.  Nose: Nose normal.  Mouth/Throat:  Oropharynx is clear and moist.  Eyes: Conjunctivae and EOM are normal. Pupils are equal, round, and reactive to light.  Neck: Normal range of motion. Neck supple. No JVD present. No tracheal deviation present. No thyromegaly present.  Cardiovascular: Normal rate, regular rhythm, normal heart sounds and intact distal pulses.  Exam reveals no gallop and no friction rub.   No murmur heard. Pulmonary/Chest: Effort normal and breath sounds normal. No stridor. No respiratory distress. He has no wheezes. He has no rales. He exhibits no tenderness.  Abdominal: Soft. Bowel sounds are normal. He exhibits no distension and no mass. There is no tenderness. There is no rebound and no guarding.  Musculoskeletal: Normal range of motion. He exhibits edema (nonpitting edema to right lower extremity, pulses present.  Healing wound to top of right foot, no signs of celllultis or infection). He exhibits no tenderness.  Lymphadenopathy:    He has no cervical adenopathy.  Neurological: He is alert and oriented to person, place, and time. He exhibits normal muscle tone. Coordination normal.  Skin: Skin is warm and dry. No rash noted. No erythema. No pallor.  Psychiatric: He has a normal mood and affect. His behavior is normal. Judgment and thought content normal.    ED Course   Procedures (including critical care time)  Labs Reviewed  CBC WITH DIFFERENTIAL  PROTIME-INR  BASIC METABOLIC PANEL   No results found. 1. Right leg swelling     MDM  62 yo male with possible DVT to right lower extremity.  Will give lovenox in the ED tonight, have patient f/u in the AM for venous duplex.    Olivia Mackie, MD 03/30/13 [phone]

## 2013-03-30 NOTE — ED Notes (Signed)
Pt states got d/c last night at  1 am from er here at [facility] and slept thru his 8 am venus doppler appointment this am pain has increased and it makes him sob he states and rt upper thigh  In groin is tender to touch and so is upper rt thigh. Doppler is here now

## 2013-03-30 NOTE — Progress Notes (Signed)
VASCULAR LAB PRELIMINARY  PRELIMINARY  PRELIMINARY  PRELIMINARY  Bilateral lower extremity venous duplex completed.    Preliminary report:  Right - Positive for chronic and acute DVT of the common femoral vein and proximal femoral vein. Acute DVT noted in the mid to distal vein. There is also chronic and acute DVT of the proximal to mid popliteal. There is no evidence of DVT noted in the posterior tibial and peroneal veins. Acute superficial thrombus also noted int the proximal greater saphenous vein. There is no evidence of a Baker's cyst. Left - Left leg was evaluated due to protocol required for positive DVT in the right lower extremity. There is no evidence of DVT, superficial thrombus or Baker's cyst.   [name], RVS 03/30/2013, 11:51 AM

## 2013-03-30 NOTE — ED Notes (Signed)
Pt was seen here last nite and told to come back for outpt venous doppler for right leg pain.  Pt states pain is worse, sob worse.  Pt has history of dvt and PE.  Pt had one dose of Lovenox last nite

## 2013-03-30 NOTE — ED Provider Notes (Signed)
Medical screening examination/treatment/procedure(s) were performed by non-physician practitioner and as supervising physician I was immediately available for consultation/collaboration.   Shanna Cisco, MD 03/30/13 [phone]

## 2013-03-30 NOTE — Telephone Encounter (Signed)
Patient advised. Appointment scheduled.  

## 2013-03-30 NOTE — ED Provider Notes (Signed)
CSN: [id number]     Arrival date & time 03/30/13  1008 History     First MD Initiated Contact with Patient 03/30/13 1028     Chief Complaint  Patient presents with  . Leg Pain  . Leg Swelling   (Consider location/radiation/quality/duration/timing/severity/associated sxs/prior Treatment) HPI Comments: 62 year old male with a past medical history of DVT, CAD, TIA, anxiety and depression presents to the emergency department after being seen last night for an ultrasound of his right leg to rule out a DVT. He was seen and evaluated in the emergency department, ultrasound was not available at that time and was advised to return the next day. He was given a dose of Lovenox at that time. He also had CBC, been and he and PT/INR checked which were normal. Right leg pain is increasing, worse when he walks or applies pressure. Pain is so severe that is making him short of breath. Denies fever or chills, chest pain, nausea or vomiting. States he had a "Chigger bite" on the top of his right foot 5 days ago.  Patient is a 62 y.o. male presenting with leg pain. The history is provided by the patient and a friend.  Leg Pain Associated symptoms: no fever     Past Medical History  Diagnosis Date  . Anxiety   . History of DVT (deep vein thrombosis) 11/08    previously on coumadin  . TIA (transient ischemic attack) 2008  . Personal history of other disorder of urinary system   . Internal hemorrhoids without mention of complication   . Other abnormal glucose   . Inguinal hernia without mention of obstruction or gangrene, unilateral or unspecified, (not specified as recurrent)   . Thrombocytopenia, unspecified   . Bladder cancer 2011    Dr. Brunilda Payor  . Depression   . Stroke 05/25/2005    TIA  . Arthritis     all over  . CAD (coronary artery disease) 2012    MI  . Alcohol abuse, unspecified     history of   Past Surgical History  Procedure Laterality Date  . Shoulder surgery  03/08/08    Left;  arthroscopy  . Hernia repair  8/11    repair of right indirect and direct hernias; incarerated umbilical hernia containing preperitoneal fat  . Cardiac catheterization  05/2011  . Tonsillectomy      as child  . Cystoscopy  07/12/2011    Procedure: CYSTOSCOPY;  Surgeon: Lindaann Slough, MD;  Location: [facility];  Service: Urology;  Laterality: N/A;  one hour being requested for this case  . Transurethral resection of bladder tumor  07/12/2011    Procedure: TRANSURETHRAL RESECTION OF BLADDER TUMOR (TURBT);  Surgeon: Lindaann Slough, MD;  Location: [facility];  Service: Urology;  Laterality: N/A;   Family History  Problem Relation Age of Onset  . Emphysema Mother   . Other Father     Hypotension from medication causing GI bleed   History  Substance Use Topics  . Smoking status: Former Smoker -- 1.50 packs/day for 25 years    Types: Cigarettes    Quit date: 08/26/2004  . Smokeless tobacco: Not on file  . Alcohol Use: No     Comment: Quit 2008    Review of Systems  Constitutional: Negative for fever and chills.  Cardiovascular: Positive for leg swelling.  All other systems reviewed and are negative.    Allergies  Crestor; Escitalopram oxalate; Latex; and Sulfonamide derivatives  Home Medications   Current Outpatient Rx  Name  Route  Sig  Dispense  Refill  . aspirin 325 MG EC tablet   Oral   Take 325 mg by mouth daily.         . cholecalciferol (VITAMIN D) 400 UNITS TABS tablet   Oral   Take 400 Units by mouth daily.         . clorazepate (TRANXENE) 7.5 MG tablet   Oral   Take 7.5 mg by mouth at bedtime.         . clorazepate (TRANXENE) 7.5 MG tablet   Oral   Take 3.75 mg by mouth daily as needed for anxiety.         Marland Kitchen ibuprofen (ADVIL,MOTRIN) 200 MG tablet   Oral   Take 200 mg by mouth every 6 (six) hours as needed for pain.         . Methylsulfonylmethane (MSM PO)   Oral   Take 1 capsule by mouth daily.         . Multiple Vitamins-Minerals (MENS MULTI  VITAMIN & MINERAL PO)   Oral   Take 1 tablet by mouth daily.         . sertraline (ZOLOFT) 100 MG tablet   Oral   Take 100 mg by mouth at bedtime.          BP 107/62  Pulse 77  Temp(Src) 98.3 F (36.8 C) (Oral)  Resp 24  SpO2 96% Physical Exam  Nursing note and vitals reviewed. Constitutional: He is oriented to person, place, and time. He appears well-developed and well-nourished. No distress.  HENT:  Head: Normocephalic and atraumatic.  Mouth/Throat: Oropharynx is clear and moist.  Eyes: Conjunctivae and EOM are normal.  Neck: Normal range of motion. Neck supple.  Cardiovascular: Normal rate, regular rhythm and normal heart sounds.   Pulmonary/Chest: Effort normal and breath sounds normal.  Abdominal: Soft. Bowel sounds are normal. There is no tenderness.  Musculoskeletal: Normal range of motion. He exhibits no edema.       Right upper leg: He exhibits tenderness.       Legs: RLE with non-pitting edema, erythema. No warmth. Erythema extends up into inner aspect of thigh.  Neurological: He is alert and oriented to person, place, and time.  Skin: Skin is warm and dry. He is not diaphoretic.  Well-healing wound to dorsal aspect of right foot, no surrounding erythema, edema or warmth.  Psychiatric: He has a normal mood and affect. His behavior is normal.    ED Course   Procedures (including critical care time)  Labs Reviewed  CBC   LE VENOUS DUPLEX Right - Positive for chronic and acute DVT of the common femoral vein and proximal femoral vein. Acute DVT noted in the mid to distal vein. There is also chronic and acute DVT of the proximal to mid popliteal. There is no evidence of DVT noted in the posterior tibial and peroneal veins. Acute superficial thrombus also noted int the proximal greater saphenous vein. There is no evidence of a Baker's cyst. Left - Left leg was evaluated due to protocol required for positive DVT in the right lower extremity. There is no evidence of  DVT, superficial thrombus or Baker's cyst.  No results found. 1. DVT (deep venous thrombosis), right     MDM  Patient with DVT. Had first dose of lovenox last night. Pain controlled today in the ED with morphine. He is in NAD. Discharge home with lovenox and pain control with vicodin. He has close f/u with  PCP. Hx of DVT, knows how to inject lovenox, has been on in the past. Return precautions given. Patient states understanding of treatment care plan and is agreeable.   Trevor Mace, PA-C 03/30/13 1210

## 2013-03-30 NOTE — Telephone Encounter (Signed)
Left detailed message on voicemail. Left message on patient's voicemail to return call to schedule appt.

## 2013-04-02 ENCOUNTER — Encounter: Payer: Self-pay | Admitting: *Deleted

## 2013-04-02 ENCOUNTER — Ambulatory Visit: Payer: BC Managed Care – PPO | Admitting: Family Medicine

## 2013-04-05 ENCOUNTER — Encounter: Payer: Self-pay | Admitting: Cardiovascular Disease

## 2013-04-05 ENCOUNTER — Ambulatory Visit (INDEPENDENT_AMBULATORY_CARE_PROVIDER_SITE_OTHER): Payer: BC Managed Care – PPO | Admitting: Cardiovascular Disease

## 2013-04-05 VITALS — BP 118/76 | HR 60 | Resp 20 | Ht 73.0 in | Wt 209.1 lb

## 2013-04-05 DIAGNOSIS — I82409 Acute embolism and thrombosis of unspecified deep veins of unspecified lower extremity: Secondary | ICD-10-CM

## 2013-04-05 DIAGNOSIS — E7800 Pure hypercholesterolemia, unspecified: Secondary | ICD-10-CM

## 2013-04-05 DIAGNOSIS — Z79899 Other long term (current) drug therapy: Secondary | ICD-10-CM

## 2013-04-05 DIAGNOSIS — Z8679 Personal history of other diseases of the circulatory system: Secondary | ICD-10-CM

## 2013-04-05 MED ORDER — RIVAROXABAN 20 MG PO TABS: 20.0000 mg | ORAL_TABLET | Freq: Every day | ORAL | Status: DC

## 2013-04-05 NOTE — Patient Instructions (Addendum)
Your physician has recommended you make the following change in your medication: START XARELTO 20MG  DAILY.  Your physician recommends that you return for lab work in: 2 WEEKS  Your physician recommends that you schedule a follow-up appointment in: 3 MONTHS.

## 2013-04-09 ENCOUNTER — Encounter: Payer: Self-pay | Admitting: Cardiovascular Disease

## 2013-04-09 NOTE — Assessment & Plan Note (Signed)
The symptoms that he had last winter were quite concerning for a focal neurological event. The apparently imaging studies are negative. His symptoms have now completely resolved. Since he'll be taking Xarelto,  I don't think he should also take an antiplatelet agent.

## 2013-04-09 NOTE — Assessment & Plan Note (Signed)
He has been intolerant of numerous statin medications, most recently Crestor. He develops lower shunting weakness and discomfort which becomes incapacitating. He did have mild coronary atherosclerosis by cardiac catheterization 2012, ideally he should be on lipid-lowering medications. We will recheck a lipid profile since there has been substantial change in his weight. Consider Zetia or a resin.

## 2013-04-09 NOTE — Progress Notes (Signed)
Patient ID: [name], male   DOB: [birth year], 62 y.o.   MRN: [id number]     Reason for office visit DVT of right leg  [name] has had several health challenges since his last appointment. He developed abrupt swelling and pain in his right lower extremity about 9 days ago and last Saturday was diagnosed as having an acute femoral deep venous thrombosis of the right leg. He has received treatment with enoxaparin has been wearing a compression stocking. The symptoms are subsiding  Last winter around the time of Christmas he developed a severe left-sided headache complicated with dysarthria and what he describes as persistent cognitive issues. He did seek medical attention, but was offended when he was told that he was probably intoxicated and decided to do with it on his own. He did not recall this initially but looking through his records he did have a CT scan of the head that was described as normal.  More recently, he has been recovering from [facility] spotted fever treated with doxycycline.   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  A couple of years ago he underwent cardiac catheterization that showed only mild coronary atherosclerosis. He has significant hyperlipidemia but unfortunately has been intolerant to several statins.    Allergies  Allergen Reactions  . Crestor [Rosuvastatin]     Mood changes  . Escitalopram Oxalate     Mood changes, irritablity  . Latex Other (See Comments)    Blood in urine with latex catheter  . Sulfonamide Derivatives Rash    Current Outpatient Prescriptions  Medication Sig Dispense Refill  . cholecalciferol (VITAMIN D) 400 UNITS TABS tablet Take 400 Units by mouth daily.      . clorazepate (TRANXENE) 7.5 MG tablet Take 7.5 mg by mouth at bedtime.      . clorazepate (TRANXENE) 7.5 MG tablet Take 3.75 mg by mouth daily as needed for anxiety.        . enoxaparin (LOVENOX) 100 MG/ML injection Inject 0.95 mLs (95 mg total) into the skin every 12 (twelve) hours.  20 mL  0  . ibuprofen (ADVIL,MOTRIN) 200 MG tablet Take 400 mg by mouth daily as needed for pain.       . Methylsulfonylmethane (MSM PO) Take 1 capsule by mouth daily.      . Multiple Vitamin (MULTIVITAMIN WITH MINERALS) TABS tablet Take 1 tablet by mouth daily.      . sertraline (ZOLOFT) 100 MG tablet Take 100 mg by mouth at bedtime.      Marland Kitchen aspirin 325 MG EC tablet Take 325 mg by mouth daily as needed.       Marland Kitchen HYDROcodone-acetaminophen (NORCO/VICODIN) 5-325 MG per tablet Take 1-2 tablets by mouth every 4 (four) hours as needed for pain.  20 tablet  0  . Rivaroxaban (XARELTO) 20 MG TABS tablet Take 1 tablet (20 mg total) by mouth daily.  30 tablet  5   No current facility-administered medications for this visit.    Past Medical History  Diagnosis Date  . Anxiety   . History of DVT (deep vein thrombosis) 11/08    previously on coumadin  . TIA (transient ischemic attack) 2008  . Personal history of other disorder of urinary system   . Internal hemorrhoids without mention of complication   . Other abnormal glucose   . Inguinal hernia without mention of obstruction or gangrene, unilateral or unspecified, (not  specified as recurrent)   . Thrombocytopenia, unspecified   . Bladder cancer 2011    Dr. Brunilda Payor  . Depression   . Stroke 05/25/2005    TIA  . Arthritis     all over  . CAD (coronary artery disease) 2012    MI  . Alcohol abuse, unspecified     history of  . Dyslipidemia   . Hypertension     Past Surgical History  Procedure Laterality Date  . Shoulder surgery  03/08/08    Left; arthroscopy  . Hernia repair  8/11    repair of right indirect and direct hernias; incarerated umbilical hernia containing preperitoneal fat  . Cardiac catheterization  05/2011    mild atherosclerosis  . Tonsillectomy      as child  . Cystoscopy  07/12/2011    Procedure: CYSTOSCOPY;   Surgeon: Lindaann Slough, MD;  Location: [facility];  Service: Urology;  Laterality: N/A;  one hour being requested for this case  . Transurethral resection of bladder tumor  07/12/2011    Procedure: TRANSURETHRAL RESECTION OF BLADDER TUMOR (TURBT);  Surgeon: Lindaann Slough, MD;  Location: [facility];  Service: Urology;  Laterality: N/A;    Family History  Problem Relation Age of Onset  . Emphysema Mother   . Other Father     Hypotension from medication causing GI bleed  . Heart failure Father     History   Social History  . Marital Status: Single    Spouse Name: N/A    Number of Children: N/A  . Years of Education: N/A   Occupational History  . [employer]    Social History Main Topics  . Smoking status: Former Smoker -- 1.50 packs/day for 25 years    Types: Cigarettes    Quit date: 08/26/2006  . Smokeless tobacco: Not on file  . Alcohol Use: No     Comment: Quit 2008  . Drug Use: 2.00 per week    Special: Marijuana     Comment: MJ-regular use  . Sexual Activity: Yes   Other Topics Concern  . 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 He denies problems with cough hemoptysis or wheezing or any pleuritic chest pain. He is not short of breath. Has not had recent neurological complaints other than his persistent hand tremor. He denies fever, chills, abdominal pain, dysphagia, nausea, vomiting, change in bowel pattern, recent weight changes, fever, chills, dysuria, hematuria, polyuria, polydipsia, skin rashes, major weight changes, and sausage or cold, with signs  PHYSICAL EXAM BP 118/76  Pulse 60  Resp 20  Ht 6\' 1"  (1.854 m)  Wt 209 lb 1.6 oz (94.847 kg)  BMI 27.59 kg/m2  General: Alert, oriented x3, no distress Head: no evidence of trauma, PERRL, EOMI, no exophtalmos or lid lag, no myxedema, no xanthelasma; normal  ears, nose and oropharynx Neck: normal jugular venous pulsations and no hepatojugular reflux; brisk carotid pulses without delay and no carotid bruits Chest: clear to auscultation, no signs of consolidation by percussion or palpation, normal fremitus, symmetrical and full respiratory excursions Cardiovascular: normal position and quality of the apical impulse, regular rhythm, normal first and second heart sounds, no murmurs, rubs or gallops Abdomen: no tenderness or distention, no masses by palpation, no abnormal pulsatility or arterial bruits, normal bowel sounds,  no hepatosplenomegaly Extremities: no clubbing, cyanosis; he has bilateral varicose veins, more prominent on the right. There is 1-2+ nontender mildly pitting edema to the knee on the right side.; 2+ radial, ulnar and brachial pulses bilaterally; 2+ right femoral, posterior tibial and dorsalis pedis pulses; 2+ left femoral, posterior tibial and dorsalis pedis pulses; no subclavian or femoral bruits Neurological: grossly nonfocal   EKG: Sinus rhythm, incomplete right bundle branch block, mild left axis deviation  Lipid Panel     Component Value Date/Time   CHOL 218* 08/29/2008 1235   TRIG 88 08/29/2008 1235   HDL 43.4 08/29/2008 1235   CHOLHDL 5.0 CALC 08/29/2008 1235   VLDL 18 08/29/2008 1235    BMET    Component Value Date/Time   NA 138 03/29/2013 2308   K 3.8 03/29/2013 2308   CL 102 03/29/2013 2308   CO2 27 03/29/2013 2308   GLUCOSE 94 03/29/2013 2308   BUN 17 03/29/2013 2308   CREATININE 0.84 03/29/2013 2308   CALCIUM 9.5 03/29/2013 2308   GFRNONAA >90 03/29/2013 2308   GFRAA >90 03/29/2013 2308     ASSESSMENT AND PLAN DEEP VENOUS THROMBOPHLEBITIS, LEG, RIGHT I think [name] should remain on lifelong anticoagulation therapy. He has had recurrent venous thromboembolic disease and this time there is no obvious cause for the event. His previous DVT complicated by pulmonary embolism occurred in 2008 and appear to be associated with surgery. He has  done well with warfarin in the past but we did discuss the option for novel agents that are easier to take. After discussion with our pharmacist he decided to opt for xarelto 20 mg once daily. The necessary education regarding the use and complications of this agent were discussed in detail. He is also encouraged to continue wearing the compression stocking and to keep the affected limb elevated as much as possible to.  Pure hypercholesterolemia He has been intolerant of numerous statin medications, most recently Crestor. He develops lower shunting weakness and discomfort which becomes incapacitating. He did have mild coronary atherosclerosis by cardiac catheterization 2012, ideally he should be on lipid-lowering medications. We will recheck a lipid profile since there has been substantial change in his weight. Consider Zetia or a resin.  TRANSIENT ISCHEMIC ATTACKS, HX OF The symptoms that he had last winter were quite concerning for a focal neurological event. The apparently imaging studies are negative. His symptoms have now completely resolved. Since he'll be taking Xarelto,  I don't think he should also take an antiplatelet agent.   Orders Placed This Encounter  Procedures  . Comp Met (CMET)   Meds ordered this encounter  Medications  . Rivaroxaban (XARELTO) 20 MG TABS tablet    Sig: Take 1 tablet (20 mg total) by mouth daily.    Dispense:  30 tablet    Refill:  5    Ayyan Sites  Thurmon Fair, MD, [facility] [phone] office [phone] pager

## 2013-04-09 NOTE — Assessment & Plan Note (Addendum)
I think [name] should remain on lifelong anticoagulation therapy. He has had recurrent venous thromboembolic disease and this time there is no obvious cause for the event. His previous DVT complicated by pulmonary embolism occurred in 2008 and appear to be associated with surgery. He has done well with warfarin in the past but we did discuss the option for novel agents that are easier to take. After discussion with our pharmacist he decided to opt for xarelto 20 mg once daily. The necessary education regarding the use and complications of this agent were discussed in detail. He is also encouraged to continue wearing the compression stocking and to keep the affected limb elevated as much as possible to.

## 2013-04-12 ENCOUNTER — Telehealth: Payer: Self-pay | Admitting: Cardiovascular Disease

## 2013-04-12 NOTE — Telephone Encounter (Signed)
Pt said dr c put on him on Xarelto  Wants to know if he is still supposed to take 83 mg aspirin  Please call

## 2013-04-12 NOTE — Telephone Encounter (Signed)
Returned call.  Pt wanted to know if he should be taking an "83 mg" ASA with Xarelto.  Stated he has read the literature and on the internet and hasn't found out a definitive answer.  Pt informed he should be taking an 81 mg ASA and many cardiologists use the combination therapy with ASA and other anticoagulation meds.  Pt verbalized understanding and agreed w/ plan.

## 2013-04-12 NOTE — Telephone Encounter (Signed)
Reviewed OV note (8.11.14) in further detail and Dr. Royann Shivers stated pt did not need antiplatelet therapy.  Call to pt and informed.  Pt stated he already took the baby ASA.  Pt also informed RN will discuss w/ Belenda Cruise, PharmD for clarification and RN will call him back.  Pt verbalized understanding and agreed w/ plan.  Belenda Cruise, PharmD notified and reviewed chart.  Advised if pt took one dose it's okay and to make sure he doesn't skip Xarelto.  Also advised pt not take anymore ASA.  Call to pt and left message to call back.  Page Char[employer] fundraiser.

## 2013-04-12 NOTE — Telephone Encounter (Signed)
Called pt back and informed per Belenda Cruise, PharmD.  Pt verbalized understanding and agreed w/ plan.

## 2013-04-14 ENCOUNTER — Telehealth: Payer: Self-pay | Admitting: Pharmacist Clinician (PhC)/ Clinical Pharmacy Specialist

## 2013-04-14 NOTE — Telephone Encounter (Signed)
Patient would like to switch from [facility] to Coumadin.  He has taken coumadin before. Please call.

## 2013-04-14 NOTE — Telephone Encounter (Signed)
Spoke with pt, was concerned after watching legal ads and reading internet, about safety of Xarelto, wasn't sure he made right decision.  Reviewed pros and cons of both meds (xarelto, warfarin) and discussed bleed risks, stroke risks and safety profiles for second time (originally in office with Dr. Salena Saner visit).  After discussion pt agreed to remain of Xarelto for time being.  Assured him that if any time he is uncomfortable with xarelto and would like to switch, he only need call and we will arrange for a conversion to warfarin,  Pt thanked me for my time and stated he will continue with xarelto for now.

## 2013-04-14 NOTE — Telephone Encounter (Signed)
Pt is concerned about Xarelto and he would like to speak to you about this.

## 2013-04-30 ENCOUNTER — Telehealth: Payer: Self-pay | Admitting: Cardiovascular Disease

## 2013-04-30 DIAGNOSIS — I82409 Acute embolism and thrombosis of unspecified deep veins of unspecified lower extremity: Secondary | ICD-10-CM

## 2013-04-30 NOTE — Telephone Encounter (Signed)
Returning your call. °

## 2013-04-30 NOTE — Telephone Encounter (Signed)
Returned call.  Pt stated he is concerned about his R ankle.  Stated his ankle "is as big around as a telephone pole."  Stated he has been trying to walk more.  C/o increased swelling x 2-3 days.  Denied "noticeable" SOB.  C/o int heartbeats that are hard and then it goes back to normal.  Also c/o pain in L leg above knee.  Pt informed he may need to be seen, but Dr. Salena Saner will be notified for further instructions and RN will call him back.  Pt verbalized understanding and agreed w/ plan.  Pt cell: [phone].

## 2013-04-30 NOTE — Telephone Encounter (Signed)
Please call-having some anxiety about his condition-pain in his other leg now-the same pain he had in his right leg.right ankle is swollen so big.

## 2013-04-30 NOTE — Telephone Encounter (Signed)
Dr. Salena Saner notified.  Advice: Make sure pt had f/u US and scan both when he comes in, f/u appt w/ him afterwards, elevate BLE and wear compression stockings.  Doppler ordered.  Returned call and informed pt per instructions by MD/PA.  Pt verbalized understanding and agreed w/ plan.  Understands scheduler to contact him for appts.  Ebony, scheduling, notified and will contact pt on mobile.

## 2013-04-30 NOTE — Telephone Encounter (Signed)
Returned call.  Left message to call back before 4pm.  

## 2013-05-06 ENCOUNTER — Telehealth (HOSPITAL_COMMUNITY): Payer: Self-pay | Admitting: *Deleted

## 2013-05-28 ENCOUNTER — Telehealth (HOSPITAL_COMMUNITY): Payer: Self-pay | Admitting: *Deleted

## 2013-06-03 ENCOUNTER — Inpatient Hospital Stay (HOSPITAL_COMMUNITY): Admission: RE | Admit: 2013-06-03 | Payer: BC Managed Care – PPO | Source: Ambulatory Visit

## 2013-06-24 ENCOUNTER — Telehealth: Payer: Self-pay | Admitting: Cardiovascular Disease

## 2013-06-24 ENCOUNTER — Ambulatory Visit (INDEPENDENT_AMBULATORY_CARE_PROVIDER_SITE_OTHER): Payer: Self-pay | Admitting: Cardiology

## 2013-06-24 ENCOUNTER — Encounter: Payer: Self-pay | Admitting: Cardiology

## 2013-06-24 ENCOUNTER — Ambulatory Visit (HOSPITAL_COMMUNITY)
Admission: RE | Admit: 2013-06-24 | Discharge: 2013-06-24 | Disposition: A | Discharge status: Home / Self Care | Payer: Self-pay | Source: Ambulatory Visit | Attending: Cardiovascular Disease | Admitting: Cardiovascular Disease

## 2013-06-24 VITALS — BP 102/70 | HR 52 | Ht 73.0 in | Wt 205.0 lb

## 2013-06-24 DIAGNOSIS — I82409 Acute embolism and thrombosis of unspecified deep veins of unspecified lower extremity: Secondary | ICD-10-CM

## 2013-06-24 DIAGNOSIS — I2510 Atherosclerotic heart disease of native coronary artery without angina pectoris: Secondary | ICD-10-CM

## 2013-06-24 DIAGNOSIS — Z8679 Personal history of other diseases of the circulatory system: Secondary | ICD-10-CM

## 2013-06-24 DIAGNOSIS — E7800 Pure hypercholesterolemia, unspecified: Secondary | ICD-10-CM

## 2013-06-24 DIAGNOSIS — G250 Essential tremor: Secondary | ICD-10-CM

## 2013-06-24 NOTE — Assessment & Plan Note (Addendum)
.  Neuro eval 2013

## 2013-06-24 NOTE — Assessment & Plan Note (Signed)
Statin intoleran

## 2013-06-24 NOTE — Telephone Encounter (Signed)
Returned call and pt verified x 2.  Pt with known RLE DVT.  C/o pain in RLE w/ tenderness.  Stated he did miss a dose of Xarelto.  Stated the pain just came on in the last 1-1 1/2 hours.  Pt missed [place] appt and is scheduled w/ Dr. Royann Shivers on 11.11.14.  Pt informed he may need to have US done and f/u appt.    [place], PA-C notified and advised [place] today and appt w/ him this afternoon.  Returned call and informed pt per instructions by MD/PA.  Pt verbalized understanding and agreed w/ plan.  [place] appt scheduled for today at 12:30pm (bilateral) and w/ Kilroy, PA-C at 2:20pm.  Pt on his way.

## 2013-06-24 NOTE — Progress Notes (Signed)
Lower Extremity Venous Duplex Completed. °Brianna L Mazza,RVT °

## 2013-06-24 NOTE — Telephone Encounter (Signed)
Having problem with xarelto.  Having  Severe pain between knee and groin  Having some trouble walking.  Also had insurance cx last month  Please call .  DVT in right leg.

## 2013-06-24 NOTE — Patient Instructions (Signed)
Your physician recommends that you have lab today Your physician recommends that you keep your appointment with Dr Royann Shivers 11/11 at 1:30pm

## 2013-06-24 NOTE — Progress Notes (Signed)
06/24/2013 Delice Bison   May 26, 1951  [id number]  Primary Physicia Crawford Givens, MD Primary Cardiologist: Dr Royann Shivers  HPI:  62 y/o followed by Dr Royann Shivers with a history of TIA, dyslipidemia,  anxiety, and minor CAD at cath Oct 2012. He had a Rt leg DVT in August this year and was put on Xarelto. He had had a prior DVT and PE after foot surgery some years ago. Dr Royann Shivers has suggested he be on Xarelto indefinitely. The pt called the office today and complained of Rt thigh pain. He admitted to missing "one dose" of Xarelto. He had dopplers today in our office that showed "new" femoral DVT on the Rt. I review this with Dr Rennis Golden as well as his original study that was done at [facility] in August. Dr Rennis Golden feels its unlikely that Mr Primo is a Xarelto failure. He suggested we get a D dimer. If this is negative then we continue Rx. If this is positive we'll need to review this with Dr Royann Shivers. He has an appointment with Dr Royann Shivers Nov 11th. In the office the pt admits he "panicked" when he had thigh pain today. He denies any dyspnea or hemoptysis.   Current Outpatient Prescriptions  Medication Sig Dispense Refill  . cholecalciferol (VITAMIN D) 400 UNITS TABS tablet Take 400 Units by mouth daily.      . clorazepate (TRANXENE) 7.5 MG tablet Take 7.5 mg by mouth at bedtime.      . clorazepate (TRANXENE) 7.5 MG tablet Take 3.75 mg by mouth daily as needed for anxiety.      . Methylsulfonylmethane (MSM PO) Take 1 capsule by mouth daily.      . Multiple Vitamin (MULTIVITAMIN WITH MINERALS) TABS tablet Take 1 tablet by mouth daily.      . Rivaroxaban (XARELTO) 20 MG TABS tablet Take 1 tablet (20 mg total) by mouth daily.  30 tablet  5  . sertraline (ZOLOFT) 100 MG tablet Take 100 mg by mouth at bedtime.      Marland Kitchen aspirin 325 MG EC tablet Take 325 mg by mouth daily as needed.       . enoxaparin (LOVENOX) 100 MG/ML injection Inject 0.95 mLs (95 mg total) into the skin every 12 (twelve) hours.  20 mL  0  .  HYDROcodone-acetaminophen (NORCO/VICODIN) 5-325 MG per tablet Take 1-2 tablets by mouth every 4 (four) hours as needed for pain.  20 tablet  0  . ibuprofen (ADVIL,MOTRIN) 200 MG tablet Take 400 mg by mouth daily as needed for pain.        No current facility-administered medications for this visit.    Allergies  Allergen Reactions  . Crestor [Rosuvastatin]     Mood changes  . Escitalopram Oxalate     Mood changes, irritablity  . Latex Other (See Comments)    Blood in urine with latex catheter  . Sulfonamide Derivatives Rash    History   Social History  . Marital Status: Single    Spouse Name: N/A    Number of Children: N/A  . Years of Education: N/A   Occupational History  . [employer]    Social History Main Topics  . Smoking status: Former Smoker -- 1.50 packs/day for 25 years    Types: Cigarettes    Quit date: 08/26/2006  . Smokeless tobacco: Not on file  . Alcohol Use: No     Comment: Quit 2008  . Drug Use: 2.00 per week    Special: Marijuana  Comment: MJ-regular use  . Sexual Activity: Yes   Other Topics Concern  . 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  Cardiovascular: negative for chest pain, dyspnea on exertion, edema, orthopnea, palpitations, paroxysmal nocturnal dyspnea or shortness of breath Dermatological: negative for rash Respiratory: negative for cough or wheezing Urologic: negative for hematuria Abdominal: negative for nausea, vomiting, diarrhea, bright red blood per rectum, melena, or hematemesis Neurologic: negative for visual changes, syncope, or dizziness All other systems reviewed and are otherwise negative except as noted above.    Blood pressure 102/70, pulse 52, height 6\' 1"  (1.854 m), weight 205 lb (92.987 kg).  General appearance: alert, cooperative and no  distress Extremities: trace edema Rt leg. He is wearing a compression stocking thigh high on the Rt.    ASSESSMENT AND PLAN:   DEEP VENOUS THROMBOPHLEBITIS, LEG, RIGHT Pt complains of Rt thigh pain  TRANSIENT ISCHEMIC ATTACKS, HX OF .  Essential tremor .Neuro eval 2013  Pure hypercholesterolemia Statin intoleran  CAD - minor - 30-40% LAD, 30% RCA 10/12 .    PLAN  Check D dimer, continue Xarelto, keep follow up with Dr Royann Shivers.  Tamantha Saline KPA-C 06/24/2013 3:59 PM

## 2013-06-24 NOTE — Assessment & Plan Note (Addendum)
Pt complains of Rt thigh pain

## 2013-06-25 LAB — D-DIMER, QUANTITATIVE: D-Dimer, Quant: 0.56 ug/mL-FEU — ABNORMAL HIGH (ref 0.00–0.48)

## 2013-07-06 ENCOUNTER — Ambulatory Visit: Payer: BC Managed Care – PPO | Admitting: Cardiovascular Disease

## 2013-07-06 ENCOUNTER — Telehealth: Payer: Self-pay | Admitting: Cardiovascular Disease

## 2013-07-06 MED ORDER — RIVAROXABAN 20 MG PO TABS: 20.0000 mg | ORAL_TABLET | Freq: Every day | ORAL | Status: DC

## 2013-07-06 NOTE — Telephone Encounter (Signed)
Would like samples of xarelto 20mg ... If we have any please call...  Thanks

## 2013-07-06 NOTE — Telephone Encounter (Signed)
Returned call and pt informed samples left at front desk.  Pt verbalized understanding and agreed w/ plan.    

## 2013-07-28 ENCOUNTER — Telehealth: Payer: Self-pay | Admitting: Cardiovascular Disease

## 2013-07-28 MED ORDER — RIVAROXABAN 20 MG PO TABS: 20.0000 mg | ORAL_TABLET | Freq: Every day | ORAL | Status: DC

## 2013-07-28 NOTE — Telephone Encounter (Signed)
Returned call and pt informed samples left at front desk.  Pt verbalized understanding and agreed w/ plan.    

## 2013-07-28 NOTE — Telephone Encounter (Signed)
Still have not gotten his insurance reinstated.Wants to know if we have any samples of Xarelto please.Marland Kitchen

## 2013-09-01 ENCOUNTER — Telehealth: Payer: Self-pay | Admitting: Cardiovascular Disease

## 2013-09-01 MED ORDER — RIVAROXABAN 20 MG PO TABS: 20.0000 mg | ORAL_TABLET | Freq: Every day | ORAL | Status: DC

## 2013-09-01 NOTE — Telephone Encounter (Signed)
Would like some samples of Xarelto 20 mg please until his insurance kicks in  February.

## 2013-09-02 ENCOUNTER — Telehealth: Payer: Self-pay | Admitting: *Deleted

## 2013-09-02 NOTE — Telephone Encounter (Signed)
Pt was returning your call.

## 2013-09-02 NOTE — Telephone Encounter (Signed)
Returned call.  Left message samples at front desk (per [facility]) and to call back before 4pm if questions.

## 2013-10-15 ENCOUNTER — Telehealth: Payer: Self-pay | Admitting: Cardiovascular Disease

## 2013-10-15 MED ORDER — RIVAROXABAN 20 MG PO TABS: 20.0000 mg | ORAL_TABLET | Freq: Every day | ORAL | Status: DC

## 2013-10-15 NOTE — Telephone Encounter (Addendum)
Returned call and pt informed samples left at front desk.  Pt verbalized understanding and agreed w/ plan.  Pt also informed appt is overdue.  Pt stated his insurance kicks in in March.  Advised he schedule appt now as Dr. Victorino December schedule is booked into March already.  Pt verbalized understanding and agreed w/ plan.  Appt scheduled for 3.16.15 at 2:45pm w/ Dr. Sallyanne Kuster.  Appt card left w/ samples.

## 2013-10-15 NOTE — Addendum Note (Signed)
Addended by: Erasmo Downer R on: 10/15/2013 09:29 AM   Modules accepted: Orders

## 2013-10-15 NOTE — Telephone Encounter (Signed)
Would like some samples of Xarelto please.

## 2013-11-08 ENCOUNTER — Encounter: Payer: Self-pay | Admitting: Cardiovascular Disease

## 2013-11-08 ENCOUNTER — Ambulatory Visit (INDEPENDENT_AMBULATORY_CARE_PROVIDER_SITE_OTHER): Payer: BC Managed Care – PPO | Admitting: Cardiovascular Disease

## 2013-11-08 VITALS — BP 112/72 | HR 60 | Resp 16 | Ht 73.0 in | Wt 213.1 lb

## 2013-11-08 DIAGNOSIS — R5383 Other fatigue: Secondary | ICD-10-CM

## 2013-11-08 DIAGNOSIS — I2510 Atherosclerotic heart disease of native coronary artery without angina pectoris: Secondary | ICD-10-CM

## 2013-11-08 DIAGNOSIS — R5381 Other malaise: Secondary | ICD-10-CM

## 2013-11-08 DIAGNOSIS — E782 Mixed hyperlipidemia: Secondary | ICD-10-CM

## 2013-11-08 DIAGNOSIS — Z79899 Other long term (current) drug therapy: Secondary | ICD-10-CM

## 2013-11-08 DIAGNOSIS — I82409 Acute embolism and thrombosis of unspecified deep veins of unspecified lower extremity: Secondary | ICD-10-CM

## 2013-11-08 NOTE — Patient Instructions (Signed)
Your physician recommends that you return for lab work when you have fasted.  Your physician recommends that you schedule a follow-up appointment in: August 2015 with Dr.Croitoru

## 2013-11-14 ENCOUNTER — Encounter: Payer: Self-pay | Admitting: Cardiovascular Disease

## 2013-11-14 NOTE — Progress Notes (Signed)
Patient ID: [name], male   DOB: July 23, 1951, 63 y.o.   MRN: [id number]      Reason for office visit DVT, recurrent  [name] is feeling well today. He has recently cut back on his consumption of coffee and cannabis and this has led to reduction in his problems of anxiety and palpitations. He  Has had a TIA, dyslipidemia, anxiety, and minor CAD at cath Oct 2012. He had a Rt leg DVT in August 2014and was put on Xarelto. He had had a prior DVT and PE after foot surgery some years ago. We have suggested he be on Xarelto indefinitely, since this last episode was unprovoked. He has no complaints today other than persistent swelling in his right lower extremity, currently managed with compression stockings.   Allergies  Allergen Reactions  . Crestor [Rosuvastatin]     Mood changes  . Escitalopram Oxalate     Mood changes, irritablity  . Latex Other (See Comments)    Blood in urine with latex catheter  . Sulfonamide Derivatives Rash    Current Outpatient Prescriptions  Medication Sig Dispense Refill  . cholecalciferol (VITAMIN D) 400 UNITS TABS tablet Take 400 Units by mouth daily.      . clorazepate (TRANXENE) 7.5 MG tablet Take 7.5 mg by mouth at bedtime.      Marland Kitchen HYDROcodone-acetaminophen (NORCO/VICODIN) 5-325 MG per tablet Take 1-2 tablets by mouth every 4 (four) hours as needed for pain.  20 tablet  0  . ibuprofen (ADVIL,MOTRIN) 200 MG tablet Take 400 mg by mouth daily as needed for pain.       . Multiple Vitamin (MULTIVITAMIN WITH MINERALS) TABS tablet Take 1 tablet by mouth daily.      . Rivaroxaban (XARELTO) 20 MG TABS tablet Take 1 tablet (20 mg total) by mouth daily.  30 tablet  0  . sertraline (ZOLOFT) 100 MG tablet Take 100 mg by mouth at bedtime.       No current facility-administered medications for this visit.    Past Medical History  Diagnosis Date  . Anxiety   . History of DVT (deep vein thrombosis) 11/08    previously on coumadin  . TIA (transient ischemic attack)  2008  . Personal history of other disorder of urinary system   . Internal hemorrhoids without mention of complication   . Other abnormal glucose   . Inguinal hernia without mention of obstruction or gangrene, unilateral or unspecified, (not specified as recurrent)   . Thrombocytopenia, unspecified   . Bladder cancer 2011    Dr. Janice Norrie  . Depression   . Stroke 05/25/2005    TIA  . Arthritis     all over  . CAD (coronary artery disease) 2012    MI  . Alcohol abuse, unspecified     history of  . Dyslipidemia   . Hypertension     Past Surgical History  Procedure Laterality Date  . Shoulder surgery  03/08/08    Left; arthroscopy  . Hernia repair  8/11    repair of right indirect and direct hernias; incarerated umbilical hernia containing preperitoneal fat  . Cardiac catheterization  05/2011    mild atherosclerosis  . Tonsillectomy      as child  . Cystoscopy  07/12/2011    Procedure: CYSTOSCOPY;  Surgeon: Hanley Ben, MD;  Location: [facility];  Service: Urology;  Laterality: N/A;  one hour being requested for this case  . Transurethral resection of bladder tumor  07/12/2011    Procedure: TRANSURETHRAL  RESECTION OF BLADDER TUMOR (TURBT);  Surgeon: Hanley Ben, MD;  Location: [facility];  Service: Urology;  Laterality: N/A;    Family History  Problem Relation Age of Onset  . Emphysema Mother   . Other Father     Hypotension from medication causing GI bleed  . Heart failure Father     History   Social History  . Marital Status: Single    Spouse Name: N/A    Number of Children: N/A  . Years of Education: N/A   Occupational History  . [employer]    Social History Main Topics  . Smoking status: Former Smoker -- 1.50 packs/day for 25 years    Types: Cigarettes    Quit date: 08/26/2004  . Smokeless tobacco: Not on file  . Alcohol Use: No     Comment: Quit 2008  . Drug Use: 2.00 per week    Special: Marijuana     Comment: MJ-regular use  10/2013 no longer uses  .  Sexual Activity: Yes   Other Topics Concern  . 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  The patient also denies abdominal pain, nausea, vomiting, dysphagia, diarrhea, constipation, polyuria, polydipsia, dysuria, hematuria, frequency, urgency, abnormal bleeding or bruising, fever, chills, unexpected weight changes, mood swings, change in skin or hair texture, change in voice quality, auditory or visual problems, allergic reactions or rashes, new musculoskeletal complaints other than usual "aches and pains".   PHYSICAL EXAM BP 112/72  Pulse 60  Resp 16  Ht _0  (1.854 m)  Wt 96.662 kg (213 lb 1.6 oz)  BMI 28.12 kg/m2  General: Alert, oriented x3, no distress Head: no evidence of trauma, PERRL, EOMI, no exophtalmos or lid lag, no myxedema, no xanthelasma; normal ears, nose and oropharynx Neck: normal jugular venous pulsations and no hepatojugular reflux; brisk carotid pulses without delay and no carotid bruits Chest: clear to auscultation, no signs of consolidation by percussion or palpation, normal fremitus, symmetrical and full respiratory excursions Cardiovascular: normal position and quality of the apical impulse, regular rhythm, normal first and second heart sounds, no murmurs, rubs or gallops Abdomen: no tenderness or distention, no masses by palpation, no abnormal pulsatility or arterial bruits, normal bowel sounds, no hepatosplenomegaly Extremities: no clubbing, cyanosis, 1-2+ edema below the knee, only on the right side; 2+ radial, ulnar and brachial pulses bilaterally; 2+ right femoral, posterior tibial and dorsalis  pedis pulses; 2+ left femoral, posterior tibial and dorsalis pedis pulses; no subclavian or femoral bruits Neurological: grossly nonfocal   EKG: Normal sinus rhythm, nonspecific intraventricular conduction delay with a QRS duration of 124 ms (old)  Lipid Panel     Component Value Date/Time   CHOL 218* 08/29/2008 1235   TRIG 88 08/29/2008 1235   HDL 43.4 08/29/2008 1235   CHOLHDL 5.0 CALC 08/29/2008 1235   VLDL 18 08/29/2008 1235    BMET    Component Value Date/Time   NA 138 03/29/2013 2308   K 3.8 03/29/2013 2308   CL 102 03/29/2013 2308   CO2 27 03/29/2013 2308   GLUCOSE 94 03/29/2013 2308   BUN 17 03/29/2013 2308   CREATININE 0.84 03/29/2013 2308  CALCIUM 9.5 03/29/2013 2308   GFRNONAA >90 03/29/2013 2308   GFRAA >90 03/29/2013 2308     ASSESSMENT AND PLAN  Suspect [name] will always have 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 It appears that the thrombus there is somewhat organized. He should continue wearing compression stockings. He should remain on full dose anticoagulation at least for a year, preferably for the remainder of his life, barring serious bleeding complications.  It is time to reevaluate his lipid profile. He has minor coronary atherosclerosis by previous angiography and ideally his LDL cholesterol should be 70 mg/dL or lower  Patient Instructions  Your physician recommends that you return for lab work when you have fasted.  Your physician recommends that you schedule a follow-up appointment in: August 2015 with Dr.Joel Cowin      Orders Placed This Encounter  Procedures  . Comp Met (CMET)  . CBC  . Lipid Profile  . EKG 12-Lead   No orders of the defined types were placed in this encounter.    Holli Humbles, MD, [place] [phone] office [phone] pager

## 2014-02-23 ENCOUNTER — Other Ambulatory Visit: Payer: Self-pay | Admitting: *Deleted

## 2014-02-23 ENCOUNTER — Telehealth: Payer: Self-pay | Admitting: Cardiovascular Disease

## 2014-02-23 MED ORDER — APIXABAN 5 MG PO TABS: 5.0000 mg | ORAL_TABLET | Freq: Two times a day (BID) | ORAL | Status: DC

## 2014-02-23 NOTE — Telephone Encounter (Signed)
Talked to [place] D pt to stop taking xalreto and start eliquis 5 mg bid

## 2014-02-23 NOTE — Telephone Encounter (Signed)
Pt. States hes bruising easily and getting headaches if he does anything physical on xalreto

## 2014-02-23 NOTE — Telephone Encounter (Signed)
[name]  Is calling because he taking Xarelto and is seeming to bruise very easily and is getting headaches if he does anything physical. Please call3  Thanks

## 2014-06-02 ENCOUNTER — Emergency Department (HOSPITAL_COMMUNITY)
Admission: EM | Admit: 2014-06-02 | Discharge: 2014-06-02 | Disposition: A | Discharge status: Home / Self Care | Payer: BC Managed Care – PPO | Source: Home / Self Care | Attending: Family Medicine | Admitting: Family Medicine

## 2014-06-02 ENCOUNTER — Encounter (HOSPITAL_COMMUNITY): Payer: Self-pay | Admitting: Emergency Medicine

## 2014-06-02 DIAGNOSIS — J069 Acute upper respiratory infection, unspecified: Secondary | ICD-10-CM

## 2014-06-02 DIAGNOSIS — J3081 Allergic rhinitis due to animal (cat) (dog) hair and dander: Secondary | ICD-10-CM

## 2014-06-02 DIAGNOSIS — B9789 Other viral agents as the cause of diseases classified elsewhere: Secondary | ICD-10-CM

## 2014-06-02 MED ORDER — FLUTICASONE PROPIONATE 50 MCG/ACT NA SUSP: 2.0000 | Freq: Every day | NASAL | Status: DC

## 2014-06-02 MED ORDER — IPRATROPIUM BROMIDE 0.06 % NA SOLN: 2.0000 | Freq: Four times a day (QID) | NASAL | Status: DC

## 2014-06-02 MED ORDER — AMOXICILLIN 875 MG PO TABS: 875.0000 mg | ORAL_TABLET | Freq: Two times a day (BID) | ORAL | Status: DC

## 2014-06-02 NOTE — ED Notes (Signed)
C/o runny nose.  Cough.  Dry throat.  Fatigue.  Since last Sunday.  No otc meds taken.  Denies fever, n/v/d.

## 2014-06-02 NOTE — ED Provider Notes (Signed)
CSN: [id number]     Arrival date & time 06/02/14  1455 History   First MD Initiated Contact with Patient 06/02/14 1524     Chief Complaint  Patient presents with  . URI   (Consider location/radiation/quality/duration/timing/severity/associated sxs/prior Treatment) HPI  Started 7 days ago. Associated w/ scratchy throat and runny nose, cough, headache. Advil and zantac w/ some improvement. No chagne in symptoms. 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  Recently obtained a new cat.     Past Medical History  Diagnosis Date  . Anxiety   . History of DVT (deep vein thrombosis) 11/08    previously on coumadin  . TIA (transient ischemic attack) 2008  . Personal history of other disorder of urinary system   . Internal hemorrhoids without mention of complication   . Other abnormal glucose   . Inguinal hernia without mention of obstruction or gangrene, unilateral or unspecified, (not specified as recurrent)   . Thrombocytopenia, unspecified   . Depression   . Stroke 05/25/2005    TIA  . Arthritis     all over  . CAD (coronary artery disease) 2012    MI  . Alcohol abuse, unspecified     history of  . Dyslipidemia   . Hypertension   . Bladder cancer 2011    Dr. Janice Norrie   Past Surgical History  Procedure Laterality Date  . Shoulder surgery  03/08/08    Left; arthroscopy  . Hernia repair  8/11    repair of right indirect and direct hernias; incarerated umbilical hernia containing preperitoneal fat  . Cardiac catheterization  05/2011    mild atherosclerosis  . Tonsillectomy      as child  . Cystoscopy  07/12/2011    Procedure: CYSTOSCOPY;  Surgeon: Hanley Ben, MD;  Location: [facility];  Service: Urology;  Laterality: N/A;  one hour being requested for this case  . Transurethral resection of bladder tumor  07/12/2011    Procedure: TRANSURETHRAL RESECTION OF BLADDER TUMOR (TURBT);  Surgeon: Hanley Ben, MD;  Location: [facility];  Service: Urology;  Laterality: N/A;   Family History  Problem Relation Age of Onset  . Emphysema Mother   . Other Father     Hypotension from medication causing GI bleed  . Heart failure Father    History  Substance Use Topics  . Smoking status: Former Smoker -- 1.50 packs/day for 25 years    Types: Cigarettes    Quit date: 08/26/2004  . Smokeless tobacco: Not on file  . Alcohol Use: No     Comment: Quit 2008    Review of Systems Per HPI with all other pertinent systems negative.   Allergies  Crestor; Escitalopram oxalate; Latex; and Sulfonamide derivatives  Home Medications   Prior to Admission medications   Medication Sig Start Date End Date Taking? Authorizing Provider  apixaban (ELIQUIS) 5 MG TABS tablet Take 1 tablet (5 mg total) by mouth 2 (two) times daily. 02/23/14  Yes Mihai Croitoru, MD  clorazepate (TRANXENE) 7.5 MG tablet Take 7.5 mg by mouth at bedtime.   Yes Historical Provider, MD  sertraline (ZOLOFT) 100 MG tablet Take 100 mg by mouth at bedtime.   Yes Historical Provider, MD  amoxicillin (AMOXIL) 875 MG tablet Take 1 tablet (875 mg total) by mouth 2 (two) times daily. 06/02/14   Waldemar Dickens, MD  cholecalciferol (VITAMIN D) 400 UNITS TABS tablet Take 400 Units  by mouth daily.    Historical Provider, MD  fluticasone (FLONASE) 50 MCG/ACT nasal spray Place 2 sprays into both nostrils at bedtime. 06/02/14   Waldemar Dickens, MD  HYDROcodone-acetaminophen (NORCO/VICODIN) 5-325 MG per tablet Take 1-2 tablets by mouth every 4 (four) hours as needed for pain. 03/30/13   Carman Ching, PA-C  ibuprofen (ADVIL,MOTRIN) 200 MG tablet Take 400 mg by mouth daily as needed for pain.     Historical Provider, MD  ipratropium (ATROVENT) 0.06 % nasal spray Place 2 sprays into both nostrils 4 (four) times daily. 06/02/14   Waldemar Dickens, MD  Multiple Vitamin (MULTIVITAMIN WITH MINERALS) TABS tablet Take 1 tablet by mouth daily.    Historical Provider, MD    BP 123/71  Pulse 102  Temp(Src) 98.5 F (36.9 C) (Oral)  Resp 12  SpO2 98% Physical Exam  Constitutional: He is oriented to person, place, and time. He appears well-developed and well-nourished. No distress.  HENT:  Pharyngeal cobblestoning Nasal congestion Frontal and maxillary sinuses nonttp No cervical lymphadenopathy  Neck: Normal range of motion.  Cardiovascular: Normal rate, normal heart sounds and intact distal pulses.   No murmur heard. Pulmonary/Chest: Effort normal. No respiratory distress.  Abdominal: Soft.  Musculoskeletal: Normal range of motion. He exhibits no tenderness.  Neurological: He is alert and oriented to person, place, and time.  Skin: Skin is warm and dry. He is not diaphoretic.  Psychiatric: He has a normal mood and affect. His behavior is normal. Judgment and thought content normal.    ED Course  Procedures (including critical care time) Labs Review Labs Reviewed - No data to display  Imaging Review No results found.   MDM   1. Allergic rhinitis due to animal hair and dander   2. Viral URI with cough    Start flonase, nasal atrovent. Continue nasal saline.  NSAIDs not an option due to eliquis Consider other OTC meds such as Mucinex-D and zyrtec or delsym Start amox only if not improving or worsen over the next few days. (pt already w/ symptoms >7 days Precautions given and all questions answered  Linna Darner, MD Family Medicine 06/02/2014, 3:49 PM      Waldemar Dickens, MD 06/02/14 [phone]

## 2014-06-02 NOTE — Discharge Instructions (Signed)
Your symptoms are likely due to a combination of an allergic response to your new cat and a viral upper respiratory tract infection Please start the flonase before bed, atrovent during the day to dry up your nasal passage, nasal saline to flush out your nasal passages Consider trying other cough and congestion medications such as Mucinex or mucinex D, zyrtec, delsym, etc. Please only start the antibiotics if you get worse or do not improve in another 3-5 days.

## 2014-08-16 ENCOUNTER — Telehealth: Payer: Self-pay | Admitting: Cardiovascular Disease

## 2014-08-16 NOTE — Telephone Encounter (Signed)
Patient called with additional phone #  [phone].

## 2014-08-16 NOTE — Telephone Encounter (Signed)
I called pt, he had contacted our office to report surficial wounds/"boils" on legs and wanted to know what to do. Pt verbalized a history of DVT and was concerned about the wounds being related. He is on Eliquis. Since he has been seen by Dr. Sallyanne Kuster, he called here in addition to his PCP. I told pt he could have his PCP evaluate. Confirmed with another nurse that eval by PCP was most appropriate route of care. Called pt back and explained this. He voiced understanding and had already contacted PCP to arrange appt. No further questions/concerns from pt.

## 2014-08-16 NOTE — Telephone Encounter (Signed)
Pt called in stating that he has some DVT's on his rt leg and would like to be seen if possible because he states that they are painful. Please call  Thanks

## 2014-08-17 ENCOUNTER — Encounter: Payer: Self-pay | Admitting: Family Medicine

## 2014-08-17 ENCOUNTER — Ambulatory Visit (INDEPENDENT_AMBULATORY_CARE_PROVIDER_SITE_OTHER): Payer: BC Managed Care – PPO | Admitting: Family Medicine

## 2014-08-17 VITALS — BP 118/70 | HR 60 | Temp 98.4°F | Wt 213.0 lb

## 2014-08-17 DIAGNOSIS — L03115 Cellulitis of right lower limb: Secondary | ICD-10-CM

## 2014-08-17 MED ORDER — CEPHALEXIN 500 MG PO CAPS: 500.0000 mg | ORAL_CAPSULE | Freq: Four times a day (QID) | ORAL | Status: DC

## 2014-08-17 NOTE — Progress Notes (Signed)
Pre visit review using our clinic review tool, if applicable. No additional management support is needed unless otherwise documented below in the visit note.  Foot injury (insect bite) 03/2013.  He had R ankle and leg swelling. Noted to have DVT at that point.  Still on anticoagulation now.   He has had episodic R leg swelling still.  Usually in compression stocking on the R leg.   He has had lesions come up on his R leg in the last 10 days.  No FCNAV.  The lesions are slightly painful, more so with being upright.  No drainage.    Meds, vitals, and allergies reviewed.   ROS: See HPI.  Otherwise, noncontributory.  nad 1+ RLE edema Chronic appearing skin changes with 2 acute lesions- a 3cm and a 1cm red macule w/o fluctuant mass on the R shin.  No pus noted/expressed.

## 2014-08-17 NOTE — Patient Instructions (Addendum)
I presume you to have a shallow infection without a pus collection.  Start the antibiotics today and update [place] if not better.  If you have profound swelling/redness/pain, then go to the ER.  Keep wearing your stocking.  Take care.

## 2014-08-21 DIAGNOSIS — L03115 Cellulitis of right lower limb: Secondary | ICD-10-CM | POA: Insufficient documentation

## 2014-08-21 NOTE — Assessment & Plan Note (Signed)
This could just be irritation/inflammation, but would cover for cellulitis in the meantime.  Start keflex, f/u prn.  See AVS. D/w pt. No need for I&D.

## 2014-09-08 ENCOUNTER — Telehealth: Payer: Self-pay | Admitting: Cardiovascular Disease

## 2014-09-08 MED ORDER — APIXABAN 5 MG PO TABS: 5.0000 mg | ORAL_TABLET | Freq: Two times a day (BID) | ORAL | Status: DC

## 2014-09-08 NOTE — Addendum Note (Signed)
Addended byChauncy Lean. on: 09/08/2014 01:04 PM   Modules accepted: Orders

## 2014-09-08 NOTE — Telephone Encounter (Signed)
Pt called in stating that Dr.Croitoru put him on Eliquis put when he went to pick it up from the pharmacy, they denied his refilled stating that his copay was $200 which he feels is a bit expensive. He would like some clarification on why the pharmacy would not refill the prescription and in the mean time he would like to receive some free samples. Please call  Thanks

## 2014-09-08 NOTE — Telephone Encounter (Signed)
I called [facility]. The reason his prescription for Eliquis was so expensive was that his insurance company changed the tier to tier 3, which equals a higher copay, plus, he has to meet his deductible for this year.    I lmom for patient to return my call so that we can talk about his options.

## 2014-09-08 NOTE — Telephone Encounter (Signed)
Patient walked in.  I reviewed the information form the pharmacy with him.  I offered to reach out to the doctor about changing to another drug or continue on with samples until he meets his deductible.  He chose to stay with the Eliquis.  I explained that we cannot provide samples every month, but we would be happy to help when we could.  I provided him with co pay cards for Eliquis and samples.  He voice appreciation and understanding.

## 2014-09-09 ENCOUNTER — Telehealth: Payer: Self-pay | Admitting: Family Medicine

## 2014-09-09 NOTE — Telephone Encounter (Signed)
Patient wanted to let you know he's doing the best he's been in 2 years.  Patient said he took the Keflex and his leg is back to normal size and color.  His ankle is the same size as his other ankle.  Patient's sensation is back in his leg.  Patient really appreciates what you did for him.  Patient will be bringing by a Handicapped Placard for for you to sign.  He was told to give it to his PCP to sign.

## 2014-09-11 NOTE — Telephone Encounter (Signed)
Noted, thanks!

## 2014-10-24 ENCOUNTER — Telehealth: Payer: Self-pay | Admitting: Cardiovascular Disease

## 2014-10-24 MED ORDER — RIVAROXABAN 20 MG PO TABS: 20.0000 mg | ORAL_TABLET | Freq: Every day | ORAL | Status: DC

## 2014-10-24 NOTE — Telephone Encounter (Signed)
Pt returning call,does not know who it was.

## 2014-10-24 NOTE — Telephone Encounter (Signed)
Pt would like to change medications

## 2014-10-24 NOTE — Telephone Encounter (Signed)
Pt would like to change back from Eliquis to Xarelto please.Please call him and let him know if this will be all right.

## 2014-10-24 NOTE — Telephone Encounter (Signed)
This will be fine. Just stop one and start the other the next day. Were there side effects or is it a formulary/cost issue? Please remind him that Xarelto has to be taken with food to work well

## 2014-10-24 NOTE — Telephone Encounter (Signed)
Pt would like to be contacted on his cell number

## 2014-10-24 NOTE — Telephone Encounter (Signed)
LMOM for patient to start Xarelto 12 hours after last dose of Eliquis, and take with food.  Will send rx to [facility] on [address].

## 2014-11-14 ENCOUNTER — Encounter: Payer: Self-pay | Admitting: Family Medicine

## 2014-11-14 ENCOUNTER — Ambulatory Visit (INDEPENDENT_AMBULATORY_CARE_PROVIDER_SITE_OTHER): Payer: BLUE CROSS/BLUE SHIELD | Admitting: Family Medicine

## 2014-11-14 ENCOUNTER — Ambulatory Visit (INDEPENDENT_AMBULATORY_CARE_PROVIDER_SITE_OTHER)
Admission: RE | Admit: 2014-11-14 | Discharge: 2014-11-14 | Disposition: A | Discharge status: Home / Self Care | Payer: BLUE CROSS/BLUE SHIELD | Source: Ambulatory Visit | Attending: Family Medicine | Admitting: Family Medicine

## 2014-11-14 VITALS — BP 110/60 | HR 65 | Temp 98.4°F | Wt 206.5 lb

## 2014-11-14 DIAGNOSIS — R109 Unspecified abdominal pain: Secondary | ICD-10-CM | POA: Diagnosis not present

## 2014-11-14 DIAGNOSIS — M545 Low back pain, unspecified: Secondary | ICD-10-CM

## 2014-11-14 MED ORDER — TIZANIDINE HCL 4 MG PO TABS: 2.0000 mg | ORAL_TABLET | Freq: Three times a day (TID) | ORAL | Status: DC | PRN

## 2014-11-14 MED ORDER — SERTRALINE HCL 100 MG PO TABS
200.0000 mg | ORAL_TABLET | Freq: Every day | ORAL | Status: DC
Start: 2014-11-14 — End: 2018-12-24

## 2014-11-14 NOTE — Patient Instructions (Signed)
Try heat, the muscle relaxer, and ibuprofen with food.  Take care.  Glad to see you.

## 2014-11-14 NOTE — Progress Notes (Signed)
Pre visit review using our clinic review tool, if applicable. No additional management support is needed unless otherwise documented below in the visit note.  He was switched back to xarelto due to cost concerns.  Now with some HA, but tolerable.  He had noted some conjunctival injection in his eyes B.  No bleeding.  Still in compression stockings.  He has had 2 DVTs lifetime.    Friday he did a lot of push mowing.  He thought he pulled a muscle in itially.  "Now it feels more like a kidney stone."  H/o stones in the past.  "It doesn't feel like a muscle anymore."  Constant pain.  Worse with leaning forward. Pain can wax and wane.   Dull throbbing if sitting still, worse with movement.  No blood in urine.  No dysuria.  No FCNAVD.  L sided back pain, no R sided sx.  No bruising, no rash known.  He voided before OV, can't give urine sample.   Had seen Bary Leriche about zoloft, is up to 200mg  a day.    nad ncat Mmm rrr ctab abd soft, not ttp L flank ttp but no CVA pain, no midline pain.   Ext w/o edema

## 2014-11-15 ENCOUNTER — Telehealth: Payer: Self-pay | Admitting: Family Medicine

## 2014-11-15 DIAGNOSIS — M549 Dorsalgia, unspecified: Secondary | ICD-10-CM | POA: Insufficient documentation

## 2014-11-15 DIAGNOSIS — M545 Low back pain, unspecified: Secondary | ICD-10-CM | POA: Insufficient documentation

## 2014-11-15 NOTE — Telephone Encounter (Signed)
Opened in error

## 2014-11-15 NOTE — Assessment & Plan Note (Signed)
No sciatica sx.  Normal S/S BLE grossly.   No stones seen on KUB and no dysuria.   Likely muscle spasms along with the known DDD/DJD in his back.   D/w pt.   Would use heat, tizanidine, and ibuprofen with food.  Routine cautions given.  F/u prn.  He agrees.

## 2014-11-16 ENCOUNTER — Other Ambulatory Visit: Payer: Self-pay | Admitting: Family Medicine

## 2014-12-07 ENCOUNTER — Other Ambulatory Visit: Payer: Self-pay | Admitting: Family Medicine

## 2014-12-07 NOTE — Telephone Encounter (Signed)
Last filled 11/14/2014--please advise

## 2014-12-07 NOTE — Telephone Encounter (Signed)
Okay to continue for now.  Sent.  Thanks.

## 2015-03-28 ENCOUNTER — Ambulatory Visit (INDEPENDENT_AMBULATORY_CARE_PROVIDER_SITE_OTHER): Payer: BLUE CROSS/BLUE SHIELD | Admitting: Family Medicine

## 2015-03-28 ENCOUNTER — Encounter: Payer: Self-pay | Admitting: Family Medicine

## 2015-03-28 VITALS — BP 92/60 | HR 56 | Temp 98.4°F | Wt 211.8 lb

## 2015-03-28 DIAGNOSIS — I868 Varicose veins of other specified sites: Secondary | ICD-10-CM

## 2015-03-28 DIAGNOSIS — I8390 Asymptomatic varicose veins of unspecified lower extremity: Secondary | ICD-10-CM

## 2015-03-28 MED ORDER — CEPHALEXIN 500 MG PO CAPS: 500.0000 mg | ORAL_CAPSULE | Freq: Four times a day (QID) | ORAL | Status: DC

## 2015-03-28 NOTE — Patient Instructions (Signed)
Take ibuprofen with food for a few days.   Use a compression stocking as tolerated.  If more redness, then start the keflex.  Take care.

## 2015-03-28 NOTE — Progress Notes (Signed)
Pre visit review using our clinic review tool, if applicable. No additional management support is needed unless otherwise documented below in the visit note.  R leg swelling.  Wore compression stocking last night.  Is better today.  Was prev on keflex for R leg lesions, end of 2015.   No L leg sx.   Recently with some chigger bites after working in the yard, on [place].   Meds, vitals, and allergies reviewed.   ROS: See HPI.  Otherwise, noncontributory.  nad R leg with chronic venous changes, slightly puffy.  Normal DP pulses.  Small (<<1cm) rec bite sites noted.  None appear acutely infected.   No fluctuant mass.

## 2015-03-29 DIAGNOSIS — I8390 Asymptomatic varicose veins of unspecified lower extremity: Secondary | ICD-10-CM | POA: Insufficient documentation

## 2015-03-29 NOTE — Assessment & Plan Note (Signed)
Doesn't appear infected currently.   Take ibuprofen with food for a few days.  Use a compression stocking as tolerated.  If more redness, then start the keflex.  He agrees.  Okay to hold off on abx currently.

## 2015-04-01 ENCOUNTER — Other Ambulatory Visit: Payer: Self-pay | Admitting: Cardiovascular Disease

## 2015-05-01 ENCOUNTER — Other Ambulatory Visit: Payer: Self-pay | Admitting: Cardiovascular Disease

## 2015-05-27 ENCOUNTER — Other Ambulatory Visit: Payer: Self-pay | Admitting: Cardiovascular Disease

## 2015-08-09 ENCOUNTER — Other Ambulatory Visit: Payer: Self-pay | Admitting: Cardiovascular Disease

## 2015-08-09 NOTE — Telephone Encounter (Signed)
Rx has been sent to the pharmacy electronically. ° °

## 2015-09-08 ENCOUNTER — Other Ambulatory Visit: Payer: Self-pay | Admitting: Cardiovascular Disease

## 2015-09-11 ENCOUNTER — Telehealth: Payer: Self-pay | Admitting: Cardiovascular Disease

## 2015-09-11 ENCOUNTER — Other Ambulatory Visit: Payer: Self-pay | Admitting: *Deleted

## 2015-09-13 NOTE — Telephone Encounter (Signed)
Close encounter 

## 2015-10-20 ENCOUNTER — Ambulatory Visit (INDEPENDENT_AMBULATORY_CARE_PROVIDER_SITE_OTHER): Payer: BLUE CROSS/BLUE SHIELD | Admitting: Family Medicine

## 2015-10-20 ENCOUNTER — Encounter: Payer: Self-pay | Admitting: Family Medicine

## 2015-10-20 VITALS — BP 128/68 | HR 68 | Temp 97.8°F | Wt 214.2 lb

## 2015-10-20 DIAGNOSIS — J069 Acute upper respiratory infection, unspecified: Secondary | ICD-10-CM | POA: Diagnosis not present

## 2015-10-20 MED ORDER — DOXYCYCLINE HYCLATE 100 MG PO TABS: 100.0000 mg | ORAL_TABLET | Freq: Two times a day (BID) | ORAL | Status: DC

## 2015-10-20 MED ORDER — LORATADINE 10 MG PO TABS: 10.0000 mg | ORAL_TABLET | Freq: Every day | ORAL | Status: DC

## 2015-10-20 MED ORDER — BENZONATATE 200 MG PO CAPS: 200.0000 mg | ORAL_CAPSULE | Freq: Three times a day (TID) | ORAL | Status: DC | PRN

## 2015-10-20 NOTE — Addendum Note (Signed)
Addended by: Tonia Ghent on: 10/20/2015 03:08 PM   Modules accepted: Orders

## 2015-10-20 NOTE — Progress Notes (Signed)
Pre visit review using our clinic review tool, if applicable. No additional management support is needed unless otherwise documented below in the visit note.  Sx started about 10 days ago.  Started with ST.  Then rhinorrhea.  Then chest congestion and cough.  Ears are plugged up.  Fatigued.  No appetite.  Cough with slightly discolored sputum.  His sx wax and wane, worse at night.  No fevers.    Meds, vitals, and allergies reviewed.   ROS: See HPI.  Otherwise, noncontributory.  GEN: nad, alert and oriented HEENT: mucous membranes moist, tm w/o erythema, nasal exam w/o erythema, clear discharge noted,  OP with cobblestoning, sinuses not ttp x4 NECK: supple w/o LA CV: rrr.   PULM: ctab, no inc wob EXT: R leg puffy at baseline, no edema on L leg SKIN: no acute rash

## 2015-10-20 NOTE — Assessment & Plan Note (Signed)
Still could be viral.  Nontoxic.  dw pt.  Rest and fluids.  Tessalon prn cough.  claritin prn rhinorrhea.  Hold doxy for now.  If prolonged sx or purulent sputum, then start.  D/w pt.  He agrees.

## 2015-10-20 NOTE — Patient Instructions (Signed)
Hold the antibiotics for now.  Start doxy if you get more discolored sputum.  Use claritin (not claritin D) for the runny nose.  Generic is loratadine.   Tessalon as needed for cough.  Take care.

## 2016-01-01 ENCOUNTER — Telehealth: Payer: Self-pay | Admitting: Cardiovascular Disease

## 2016-01-01 NOTE — Telephone Encounter (Signed)
1. Type of surgery: trigger finger release surgery (trigger finger treatment q42months) 2. Date of surgery: Thursday May 11 3. Surgeon: Dr. Rush Farmer [facility] 4. Medications that need to be held & how long: xarelto - he states he had surgery before and had a lovenox bridge 5. Fax and/or Phone: [place]  Patient has not been seen 11/08/2013  Message routed to Dr. Sallyanne Kuster & clinical pharmacy staff to advise

## 2016-01-01 NOTE — Telephone Encounter (Signed)
Pt is having unexpected hand surgery on this Thursday. He wants to know what to do about his Xarelto please.

## 2016-01-01 NOTE — Telephone Encounter (Signed)
Ok to hold x 2 days prior to procedure.  We do not lovenox bridge for Xarelto.  Have him re-start Xarelto either same day as procedure or day after, at preference of hand surgeon.

## 2016-01-01 NOTE — Telephone Encounter (Signed)
Thanks, [name]. [name], please make sure he has a follow up appt. MCr

## 2016-01-02 NOTE — Telephone Encounter (Signed)
LMTCB

## 2016-01-02 NOTE — Telephone Encounter (Signed)
Spoke with patient and communicated MD/clinical pharmacist advice regarding xarelto in preparation of hand surgery. Patient voiced understanding. Message routed to Dr. Ninfa Linden  Patient scheduled for MD OV with Dr. Sallyanne Kuster June 15th @ 4:15pm

## 2016-01-02 NOTE — Telephone Encounter (Signed)
Returning your call. °

## 2016-01-23 ENCOUNTER — Telehealth: Payer: Self-pay | Admitting: Family Medicine

## 2016-01-23 NOTE — Telephone Encounter (Signed)
Please contact patient. See what meds he is off/on and let me know what needs to be sent in.  Needs 33min OV. Please check to make sure patient is okay for outpatient f/u, ie no SI/HI.  Thanks.

## 2016-01-23 NOTE — Telephone Encounter (Signed)
Left message on patient's voicemail to return call on both home and cell phone.

## 2016-01-23 NOTE — Telephone Encounter (Addendum)
[name] a PA with [facility] saw the patient in his office today and said we need to call the patient to make an appt with Dr. Damita Dunnings because the patient was crying uncontrollably in his office and the patient has been off of his Zoloft for two weeks.  PA said the patient needs to go back on all of his meds.

## 2016-01-25 NOTE — Telephone Encounter (Signed)
Left message on patient's voicemail to return call on home and cell phone.

## 2016-01-26 ENCOUNTER — Encounter: Payer: Self-pay | Admitting: *Deleted

## 2016-01-26 NOTE — Telephone Encounter (Signed)
Pt returned your call, please call back. He says you have the best number  Thank you

## 2016-01-26 NOTE — Telephone Encounter (Signed)
Noted. Thanks.  Glad he is doing better.   

## 2016-01-26 NOTE — Telephone Encounter (Signed)
Patient says he is fine and is back on his Zoloft. Patient had 2 trigger fingers on his right hand repaired which had been very painful.   He had not had any breakfast or enough water intake at the time and when they took out the stitches, his tremors got worse and with other outside circumstances, he somewhat had a panic attack in addition to being off his Zoloft for a few weeks.  Patient is now back on his Zoloft and doing well and was advised that he should not abruptly stop this medication. Patient understands and will not allow that to happen again.  Patient will call if an appointment is needed.

## 2016-01-26 NOTE — Telephone Encounter (Signed)
Tried to contact patient on numerous occasions with no response to messages left on home and cell phone.  Letter mailed.

## 2016-02-06 ENCOUNTER — Telehealth: Payer: Self-pay | Admitting: Cardiovascular Disease

## 2016-02-08 ENCOUNTER — Ambulatory Visit: Payer: BLUE CROSS/BLUE SHIELD | Admitting: Cardiovascular Disease

## 2016-02-08 NOTE — Telephone Encounter (Signed)
Closed encounter °

## 2016-03-08 ENCOUNTER — Other Ambulatory Visit: Payer: Self-pay | Admitting: Cardiovascular Disease

## 2016-03-08 NOTE — Telephone Encounter (Signed)
Rx has been sent to the pharmacy electronically. ° °

## 2016-04-10 ENCOUNTER — Encounter (INDEPENDENT_AMBULATORY_CARE_PROVIDER_SITE_OTHER): Payer: Self-pay

## 2016-04-10 ENCOUNTER — Ambulatory Visit (INDEPENDENT_AMBULATORY_CARE_PROVIDER_SITE_OTHER): Payer: BLUE CROSS/BLUE SHIELD | Admitting: Cardiovascular Disease

## 2016-04-10 ENCOUNTER — Encounter: Payer: Self-pay | Admitting: Cardiovascular Disease

## 2016-04-10 VITALS — BP 136/74 | HR 59 | Ht 73.0 in | Wt 205.0 lb

## 2016-04-10 DIAGNOSIS — I825Z1 Chronic embolism and thrombosis of unspecified deep veins of right distal lower extremity: Secondary | ICD-10-CM

## 2016-04-10 DIAGNOSIS — Z7901 Long term (current) use of anticoagulants: Secondary | ICD-10-CM

## 2016-04-10 DIAGNOSIS — I2510 Atherosclerotic heart disease of native coronary artery without angina pectoris: Secondary | ICD-10-CM | POA: Diagnosis not present

## 2016-04-10 DIAGNOSIS — E785 Hyperlipidemia, unspecified: Secondary | ICD-10-CM | POA: Diagnosis not present

## 2016-04-10 NOTE — Progress Notes (Signed)
Cardiology Office Note    Date:  04/11/2016   ID:  [name], DOB Aug 13, 1951, MRN [id number]  PCP:  Elsie Stain, MD  Cardiologist:   Sanda Klein, MD   Chief complaint: DVT f/u   History of Present Illness:  [name] is a 65 y.o. male history of recurrent deep venous thrombosis of the lower extremities on lifelong anticoagulation (Xarelto), minor coronary artery disease (angiography 2012), history of transient ischemic attack, dyslipidemia, anxiety disorder, peripheral venous insufficiency, here for routine follow-up.  He has no complaints of worsening leg edema, cough, hemoptysis, pleuritic chest pain, dyspnea, angina pectoris, focal neurological complaints, intermittent claudication, palpitations or syncope.   He has had a variety of joint problems and wonders whether Xarelto has been reported to cause arthritis. I told him that I'm not aware of such a side effect. He had surgery for trigger fingers in his right hand. He interrupted the Xarelto temporarily without any adverse effects. He still has very poor range of motion is in the fingers of his right hand.  He has hyperlipidemia but reports intolerance to multiple statins, most recently Crestor. Recommended a repeat lipid profile in March 2015, which is the last time he was seen in our clinic. He never had this study performed.   Past Medical History:  Diagnosis Date  . Alcohol abuse, unspecified    history of  . Anxiety   . Arthritis    all over  . Bladder cancer [facility]) 2011   Dr. Janice Norrie  . CAD (coronary artery disease) 2012   MI  . Depression   . Dyslipidemia   . History of DVT (deep vein thrombosis) 11/08 and again in 2014   previously on coumadin  . Hypertension   . Inguinal hernia without mention of obstruction or gangrene, unilateral or unspecified, (not specified as recurrent)   . Internal hemorrhoids without mention of complication   . Other abnormal glucose   . Personal history of other disorder of  urinary system   . Stroke ([place]) 05/25/2005   TIA  . Thrombocytopenia, unspecified ([place])   . TIA (transient ischemic attack) 2008    Past Surgical History:  Procedure Laterality Date  . CARDIAC CATHETERIZATION  05/2011   mild atherosclerosis  . CYSTOSCOPY  07/12/2011   Procedure: CYSTOSCOPY;  Surgeon: Hanley Ben, MD;  Location: [facility];  Service: Urology;  Laterality: N/A;  one hour being requested for this case  . HERNIA REPAIR  8/11   repair of right indirect and direct hernias; incarerated umbilical hernia containing preperitoneal fat  . SHOULDER SURGERY  03/08/08   Left; arthroscopy  . TONSILLECTOMY     as child  . TRANSURETHRAL RESECTION OF BLADDER TUMOR  07/12/2011   Procedure: TRANSURETHRAL RESECTION OF BLADDER TUMOR (TURBT);  Surgeon: Hanley Ben, MD;  Location: [facility];  Service: Urology;  Laterality: N/A;    Current Medications: Outpatient Medications Prior to Visit  Medication Sig Dispense Refill  . benzonatate (TESSALON) 200 MG capsule Take 1 capsule (200 mg total) by mouth 3 (three) times daily as needed for cough. 30 capsule 1  . clorazepate (TRANXENE) 7.5 MG tablet Take 7.5 mg by mouth at bedtime.    Marland Kitchen doxycycline (VIBRA-TABS) 100 MG tablet Take 1 tablet (100 mg total) by mouth 2 (two) times daily. 20 tablet 0  . ibuprofen (ADVIL,MOTRIN) 200 MG tablet Take 400 mg by mouth daily as needed for pain.     Marland Kitchen loratadine (CLARITIN) 10 MG tablet Take 1 tablet (10 mg  total) by mouth daily.    . Multiple Vitamin (MULTIVITAMIN WITH MINERALS) TABS tablet Take 1 tablet by mouth daily.    . sertraline (ZOLOFT) 100 MG tablet Take 2 tablets (200 mg total) by mouth at bedtime.    Marland Kitchen tiZANidine (ZANAFLEX) 4 MG tablet take 1/2 to 1 tablet by mouth every 8 hours if needed for muscle spasm   (CAUTION: MAY CAUSE SEDATION/DROWSINESS) 30 tablet 0  . XARELTO 20 MG TABS tablet take 1 tablet by mouth once daily with SUPPER    30 tablet 0   No facility-administered medications prior to  visit.      Allergies:   Crestor [rosuvastatin]; Escitalopram oxalate; Latex; and Sulfonamide derivatives   Social History   Social History  . Marital status: Single    Spouse name: N/A  . Number of children: N/A  . Years of education: N/A   Occupational History  . [employer]    Social History Main Topics  . Smoking status: Former Smoker    Packs/day: 1.50    Years: 25.00    Types: Cigarettes    Quit date: 08/26/2004  . Smokeless tobacco: Former Systems developer  . Alcohol use No     Comment: Quit 2009  . Drug use:     Frequency: 2.0 times per week    Types: Marijuana     Comment: MJ-regular use  10/2013 no longer uses  . Sexual activity: Yes   Other Topics Concern  . 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   ROS:   Please see the history of present illness.    ROS All other systems reviewed and are negative.   PHYSICAL EXAM:   VS:  BP 136/74   Pulse (!) 59   Ht 6\' 1"  (1.854 m)   Wt 205 lb (93 kg)   BMI 27.05 kg/m    GEN: Well nourished, well developed, in no acute distress  HEENT: normal  Neck: no JVD, carotid bruits, or masses Cardiac: RRR; no murmurs, rubs, or gallops,no edema, prominent varicose veins  Respiratory:  clear to auscultation bilaterally, normal work of breathing GI: soft, nontender, nondistended, + BS MS: no deformity or atrophy  Skin: warm and dry, no rash Neuro:  Alert and Oriented x 3, Strength and sensation are intact Psych: euthymic mood, full affect  Wt Readings from Last 3 Encounters:  04/10/16 205 lb (93 kg)  10/20/15 214 lb 4 oz (97.2 kg)  03/28/15 211 lb 12 oz (96 kg)      Studies/Labs Reviewed:   EKG:  EKG is ordered today.  The ekg ordered today demonstrates Sinus rhythm, poor R-wave progression due to left anterior fascicular block, nonspecific  T-wave changes, QTc 421 ms    ASSESSMENT:    1. Chronic venous embolism and thrombosis of deep vessels of distal end of right lower extremity ([place])   2. Coronary artery disease involving native coronary artery of native heart without angina pectoris   3. Hyperlipidemia   4. Long term (current) use of anticoagulants      PLAN:  In order of problems listed above:  1. Recurrent DVT/post phlebitic sd.: Recommend lifelong anticoagulation. Wear compression stockings for swelling in his right leg 2. Coronary atherosclerosis: Asymptomatic, minor lesions detected by cardiac cath in 2012. Most of his  risk factors are addressed except for elevated cholesterol level.  3. HLP: He did not have his lipid profile rechecked as recommended. He seems to have no desire to take statins and I therefore don't think repeating the test makes a lot of sense. He is encouraged to increase physical activity.  4. Xarelto: check CMET    Medication Adjustments/Labs and Tests Ordered: Current medicines are reviewed at length with the patient today.  Concerns regarding medicines are outlined above.  Medication changes, Labs and Tests ordered today are listed in the Patient Instructions below. Patient Instructions  Dr Sallyanne Kuster recommends that you schedule a follow-up appointment in 12 months. You will receive a reminder letter in the mail two months in advance. If you don't receive a letter, please call our office to schedule the follow-up appointment.  If you need a refill on your cardiac medications before your next appointment, please call your pharmacy.    Signed, Sanda Klein, MD  04/11/2016 2:40 PM    [facility] [place], [place], [place]  [address] Phone: [phone]; Fax: [phone]

## 2016-04-10 NOTE — Patient Instructions (Signed)
Dr Croitoru recommends that you schedule a follow-up appointment in 12 months. You will receive a reminder letter in the mail two months in advance. If you don't receive a letter, please call our office to schedule the follow-up appointment.  If you need a refill on your cardiac medications before your next appointment, please call your pharmacy. 

## 2016-04-21 ENCOUNTER — Other Ambulatory Visit: Payer: Self-pay | Admitting: Cardiovascular Disease

## 2016-04-23 ENCOUNTER — Telehealth: Payer: Self-pay

## 2016-04-23 DIAGNOSIS — Z79899 Other long term (current) drug therapy: Secondary | ICD-10-CM

## 2016-04-23 NOTE — Telephone Encounter (Signed)
-----   Message from Sanda Klein, MD sent at 04/11/2016  2:38 PM EDT ----- Need a CMET please to make sure the anticoagulant is still appropriate for him

## 2016-04-23 NOTE — Telephone Encounter (Signed)
lmtcb

## 2016-06-07 ENCOUNTER — Encounter: Payer: Self-pay | Admitting: *Deleted

## 2016-06-07 NOTE — Telephone Encounter (Signed)
Lab slip mailed to patient.

## 2016-11-24 ENCOUNTER — Other Ambulatory Visit: Payer: Self-pay | Admitting: Cardiovascular Disease

## 2016-11-24 DIAGNOSIS — I82409 Acute embolism and thrombosis of unspecified deep veins of unspecified lower extremity: Secondary | ICD-10-CM

## 2016-11-26 NOTE — Telephone Encounter (Signed)
Need recent blood work prior to refill authorization.  30day supply sent to pharmacy ; no refills  Lab orders mailed to patient. He agreed to get blood work done [employer] to next refill due date.

## 2017-01-02 ENCOUNTER — Other Ambulatory Visit (INDEPENDENT_AMBULATORY_CARE_PROVIDER_SITE_OTHER): Payer: BLUE CROSS/BLUE SHIELD

## 2017-01-02 DIAGNOSIS — I82409 Acute embolism and thrombosis of unspecified deep veins of unspecified lower extremity: Secondary | ICD-10-CM

## 2017-01-02 LAB — CBC
HEMATOCRIT: 47.9 % (ref 38.5–50.0)
HEMOGLOBIN: 16.4 g/dL (ref 13.2–17.1)
MCH: 32.4 pg (ref 27.0–33.0)
MCHC: 34.2 g/dL (ref 32.0–36.0)
MCV: 94.7 fL (ref 80.0–100.0)
MPV: 10.6 fL (ref 7.5–12.5)
Platelets: 159 10*3/uL (ref 140–400)
RBC: 5.06 MIL/uL (ref 4.20–5.80)
RDW: 13.6 % (ref 11.0–15.0)
WBC: 3.9 10*3/uL (ref 3.8–10.8)

## 2017-01-03 ENCOUNTER — Other Ambulatory Visit: Payer: Self-pay

## 2017-01-03 LAB — COMPREHENSIVE METABOLIC PANEL
ALT: 37 U/L (ref 9–46)
AST: 33 U/L (ref 10–35)
Albumin: 3.9 g/dL (ref 3.6–5.1)
Alkaline Phosphatase: 34 U/L — ABNORMAL LOW (ref 40–115)
BUN: 17 mg/dL (ref 7–25)
CHLORIDE: 109 mmol/L (ref 98–110)
CO2: 21 mmol/L (ref 20–31)
Calcium: 8.8 mg/dL (ref 8.6–10.3)
Creat: 0.88 mg/dL (ref 0.70–1.25)
GLUCOSE: 100 mg/dL — AB (ref 65–99)
POTASSIUM: 4.1 mmol/L (ref 3.5–5.3)
SODIUM: 139 mmol/L (ref 135–146)
Total Bilirubin: 0.7 mg/dL (ref 0.2–1.2)
Total Protein: 6.9 g/dL (ref 6.1–8.1)

## 2017-01-03 MED ORDER — RIVAROXABAN 20 MG PO TABS: 20.0000 mg | ORAL_TABLET | Freq: Every day | ORAL | 3 refills | Status: DC

## 2017-01-03 MED ORDER — RIVAROXABAN 20 MG PO TABS: 20.0000 mg | ORAL_TABLET | Freq: Every day | ORAL | 5 refills | Status: DC

## 2017-01-03 NOTE — Addendum Note (Signed)
Addended by: Diana Eves on: 01/03/2017 12:40 PM   Modules accepted: Orders

## 2017-01-03 NOTE — Telephone Encounter (Signed)
Rx(s) sent to pharmacy electronically.  

## 2017-05-26 ENCOUNTER — Other Ambulatory Visit: Payer: Self-pay | Admitting: Cardiovascular Disease

## 2017-07-21 ENCOUNTER — Other Ambulatory Visit: Payer: Self-pay | Admitting: Cardiovascular Disease

## 2017-09-02 ENCOUNTER — Ambulatory Visit (INDEPENDENT_AMBULATORY_CARE_PROVIDER_SITE_OTHER): Payer: Self-pay | Admitting: Orthopedic Surgery

## 2017-12-01 ENCOUNTER — Telehealth: Payer: Self-pay | Admitting: Cardiovascular Disease

## 2017-12-01 NOTE — Telephone Encounter (Signed)
Patient has been made aware that samples are available at the front.   Medication Samples have been provided to the patient.  Drug name: Xarelto       Strength: 20 mg        Qty: 3 bottles  LOT: [id number]  Exp.Date: 2/21

## 2017-12-01 NOTE — Telephone Encounter (Signed)
New message    Patient calling the office for samples of medication:   1.  What medication and dosage are you requesting samples for? XARELTO 20 MG TABS tablet  2.  Are you currently out of this medication? Yes   Pt c/o medication issue:  1. Name of Medication: XARELTO 20 MG TABS tablet  2. How are you currently taking this medication (dosage and times per day)?take 1 tablet by mouth once daily WITH A MEAL  3. Are you having a reaction (difficulty breathing--STAT)? No  4. What is your medication issue? Too costly

## 2018-01-05 ENCOUNTER — Telehealth: Payer: Self-pay | Admitting: Cardiovascular Disease

## 2018-01-05 NOTE — Telephone Encounter (Signed)
New message   Patient calling the office for samples of medication:   1.  What medication and dosage are you requesting samples for?XARELTO 20 MG TABS tablet  2.  Are you currently out of this medication? yes

## 2018-01-05 NOTE — Telephone Encounter (Signed)
Medication samples have been provided to the patient.  Drug name: xarelto 20mg   Qty: 3 bottles  LOT: [id number]  Exp.Date: 3/21  Samples left at front desk for patient pick-up. LM about samples & that patient needs appointment   Sheral Apley M 2:12 PM 01/05/2018

## 2018-02-11 ENCOUNTER — Telehealth: Payer: Self-pay | Admitting: Cardiovascular Disease

## 2018-02-11 NOTE — Telephone Encounter (Signed)
Medication Samples have been provided to the patient.  Drug name: Xarelto       Strength: 20mg         Qty: 3 bottles  LOT: [id number]  Exp.Date: 4/21  Patient made aware that samples are at the front desk. Appointment for follow up on 7/22

## 2018-02-11 NOTE — Telephone Encounter (Signed)
Patient calling the office for samples of medication: ° ° °1.  What medication and dosage are you requesting samples for? Xarelto  ° °2.  Are you currently out of this medication? yes ° ° °

## 2018-03-15 DIAGNOSIS — I87009 Postthrombotic syndrome without complications of unspecified extremity: Secondary | ICD-10-CM | POA: Insufficient documentation

## 2018-03-15 DIAGNOSIS — Z7901 Long term (current) use of anticoagulants: Secondary | ICD-10-CM | POA: Insufficient documentation

## 2018-03-15 NOTE — Progress Notes (Signed)
The.  Is able to Xarelto 27 pulse milligrams slow heart note was not with prior revascularization is normal  CAD evaluation prior to   Cardiology Office Note    Date:  03/17/2018   ID:  [name], DOB 26-Aug-1951, MRN [id number]  PCP:  Tonia Ghent, MD  Cardiologist:   Sanda Klein, MD   Chief complaint: DVT f/u   History of Present Illness:  [name] is a 67 y.o. male history of recurrent deep venous thrombosis of the lower extremities on lifelong anticoagulation (Xarelto), minor coronary artery disease (angiography 2012), history of transient ischemic attack, dyslipidemia, anxiety disorder, peripheral venous insufficiency, here for routine follow-up.  The patient specifically denies any chest pain at rest exertion, dyspnea at rest or with exertion, orthopnea, paroxysmal nocturnal dyspnea, syncope, palpitations, focal neurological deficits, intermittent claudication, lower extremity edema, unexplained weight gain, cough, hemoptysis or wheezing.  He wears compression stockings on his right calf to avoid swelling and this works well.  The leg is sometimes a little discolored, but without pain or other discomfort.  He has been physically active, running and riding his mountain bike without difficulty breathing.  He has not had falls and denies any bleeding events.  He has hyperlipidemia but reports intolerance to multiple statins, most recently Crestor. Recommended a repeat lipid profile in  2015 and 2017. He never had this study performed.  Past Medical History:  Diagnosis Date  . Alcohol abuse, unspecified    history of  . Anxiety   . Arthritis    all over  . Bladder cancer [facility]) 2011   Dr. Janice Norrie  . CAD (coronary artery disease) 2012   MI  . Depression   . Dyslipidemia   . History of DVT (deep vein thrombosis) 11/08 and again in 2014   previously on coumadin  . Hypertension   . Inguinal hernia without mention of obstruction or gangrene, unilateral or unspecified,  (not specified as recurrent)   . Internal hemorrhoids without mention of complication   . Other abnormal glucose   . Personal history of other disorder of urinary system   . Stroke ([place]) 05/25/2005   TIA  . Thrombocytopenia, unspecified ([place])   . TIA (transient ischemic attack) 2008    Past Surgical History:  Procedure Laterality Date  . CARDIAC CATHETERIZATION  05/2011   mild atherosclerosis  . CYSTOSCOPY  07/12/2011   Procedure: CYSTOSCOPY;  Surgeon: Hanley Ben, MD;  Location: [facility];  Service: Urology;  Laterality: N/A;  one hour being requested for this case  . HERNIA REPAIR  8/11   repair of right indirect and direct hernias; incarerated umbilical hernia containing preperitoneal fat  . SHOULDER SURGERY  03/08/08   Left; arthroscopy  . TONSILLECTOMY     as child  . TRANSURETHRAL RESECTION OF BLADDER TUMOR  07/12/2011   Procedure: TRANSURETHRAL RESECTION OF BLADDER TUMOR (TURBT);  Surgeon: Hanley Ben, MD;  Location: [facility];  Service: Urology;  Laterality: N/A;    Current Medications: Outpatient Medications Prior to Visit  Medication Sig Dispense Refill  . benzonatate (TESSALON) 200 MG capsule Take 1 capsule (200 mg total) by mouth 3 (three) times daily as needed for cough. 30 capsule 1  . clorazepate (TRANXENE) 7.5 MG tablet Take 7.5 mg by mouth at bedtime.    Marland Kitchen doxycycline (VIBRA-TABS) 100 MG tablet Take 1 tablet (100 mg total) by mouth 2 (two) times daily. 20 tablet 0  . ibuprofen (ADVIL,MOTRIN) 200 MG tablet Take 400 mg by mouth daily as  needed for pain.     Marland Kitchen loratadine (CLARITIN) 10 MG tablet Take 1 tablet (10 mg total) by mouth daily.    . Multiple Vitamin (MULTIVITAMIN WITH MINERALS) TABS tablet Take 1 tablet by mouth daily.    . sertraline (ZOLOFT) 100 MG tablet Take 2 tablets (200 mg total) by mouth at bedtime.    Marland Kitchen tiZANidine (ZANAFLEX) 4 MG tablet take 1/2 to 1 tablet by mouth every 8 hours if needed for muscle spasm   (CAUTION: MAY CAUSE  SEDATION/DROWSINESS) 30 tablet 0  . XARELTO 20 MG TABS tablet take 1 tablet by mouth once daily with SUPPER    30 tablet 0   No facility-administered medications prior to visit.      Allergies:   Crestor [rosuvastatin]; Escitalopram oxalate; Latex; and Sulfonamide derivatives   Social History   Socioeconomic History  . Marital status: Single    Spouse name: Not on file  . Number of children: Not on file  . Years of education: Not on file  . Highest education level: Not on file  Occupational History  . Occupation: Environmental manager  Social Needs  . Financial resource strain: Not on file  . Food insecurity:    Worry: Not on file    Inability: Not on file  . Transportation needs:    Medical: Not on file    Non-medical: Not on file  Tobacco Use  . Smoking status: Former Smoker    Packs/day: 1.50    Years: 25.00    Pack years: 37.50    Types: Cigarettes    Last attempt to quit: 08/26/2004    Years since quitting: 13.5  . Smokeless tobacco: Former Network engineer and Sexual Activity  . Alcohol use: No    Alcohol/week: 0.0 oz    Comment: Quit 2009  . Drug use: Yes    Frequency: 2.0 times per week    Types: Marijuana    Comment: MJ-regular use  10/2013 no longer uses  . Sexual activity: Yes  Lifestyle  . Physical activity:    Days per week: Not on file    Minutes per session: Not on file  . Stress: Not on file  Relationships  . Social connections:    Talks on phone: Not on file    Gets together: Not on file    Attends religious service: Not on file    Active member of club or organization: Not on file    Attends meetings of clubs or organizations: Not on file    Relationship status: Not on file  Other Topics Concern  . 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   ROS:   Please see  the history of present illness.    ROS All other systems reviewed and are negative.   PHYSICAL EXAM:   VS:  BP 124/72   Pulse (!) 49   Ht 6\' 2"  (1.88 m)   Wt 209 lb 12.8 oz (95.2 kg)   BMI 26.94 kg/m     General: Alert, oriented x3, no distress, comfortable, smiling Head: no evidence of trauma, PERRL, EOMI, no exophtalmos or lid lag, no myxedema, no xanthelasma; normal ears, nose and oropharynx Neck: normal jugular venous pulsations and no hepatojugular reflux; brisk carotid pulses without delay and no carotid bruits  Chest: clear to auscultation, no signs of consolidation by percussion or palpation, normal fremitus, symmetrical and full respiratory excursions Cardiovascular: normal position and quality of the apical impulse, regular rhythm, normal first and second heart sounds, no murmurs, rubs or gallops Abdomen: no tenderness or distention, no masses by palpation, no abnormal pulsatility or arterial bruits, normal bowel sounds, no hepatosplenomegaly Extremities: no clubbing, cyanosis or edema; compression stocking right calf 2+ radial, ulnar and brachial pulses bilaterally; 2+ right femoral, posterior tibial and dorsalis pedis pulses; 2+ left femoral, posterior tibial and dorsalis pedis pulses; no subclavian or femoral bruits Neurological: grossly nonfocal Psych: Normal mood and affect   Wt Readings from Last 3 Encounters:  03/16/18 209 lb 12.8 oz (95.2 kg)  04/10/16 205 lb (93 kg)  10/20/15 214 lb 4 oz (97.2 kg)      Studies/Labs Reviewed:   EKG:  EKG is ordered today.  The ekg ordered today demonstrates sinus bradycardia 49 bpm, left anterior fascicular block (old)  Lipid Panel     Component Value Date/Time   CHOL 218 (HH) 08/29/2008 1235   TRIG 88 08/29/2008 1235   HDL 43.4 08/29/2008 1235   CHOLHDL 5.0 CALC 08/29/2008 1235   VLDL 18 08/29/2008 1235   LDLDIRECT 158.7 08/29/2008 1235   BMET    Component Value Date/Time   NA 139 01/02/2017 0953   K 4.1 01/02/2017  0953   CL 109 01/02/2017 0953   CO2 21 01/02/2017 0953   GLUCOSE 100 (H) 01/02/2017 0953   BUN 17 01/02/2017 0953   CREATININE 0.88 01/02/2017 0953   CALCIUM 8.8 01/02/2017 0953   GFRNONAA >90 03/29/2013 2308   GFRAA >90 03/29/2013 2308     ASSESSMENT:    1. Postphlebitic syndrome   2. Coronary artery disease involving native coronary artery of native heart without angina pectoris   3. Pure hypercholesterolemia   4. Long term (current) use of anticoagulants      PLAN:  In order of problems listed above:  1. Recurrent DVT/postphlebitic sd.:  Plan for lifelong anticoagulation, continue wearing compression stocking which is working well for him. 2. Coronary atherosclerosis: Asymptomatic, minor lesions detected by cardiac cath in 2012. Most of his risk factors are addressed except for elevated cholesterol level.   3. HLP: He has not had values rechecked.  He has no desire to take statins or other lipid-lowering agents. 4. Xarelto: check CMET at least yearly.  Problems    Medication Adjustments/Labs and Tests Ordered: Current medicines are reviewed at length with the patient today.  Concerns regarding medicines are outlined above.  Medication changes, Labs and Tests ordered today are listed in the Patient Instructions below. Patient Instructions  Dr Sallyanne Kuster recommends that you schedule a follow-up appointment in 12 months. You will receive a reminder letter in the mail two months in advance. If you don't receive a letter, please call our office to schedule the follow-up appointment.  If you need a refill on your cardiac medications before your next appointment, please call your pharmacy.  Medication samples have been provided to the patient. Drug name: Xarelto 20 mg Qty: 28 tabs LOT: [id number] Exp.Date: 01/2020    SignedSanda Klein, MD  03/17/2018 1:07 PM    [address] [place], [place], [place]  [address] Phone: [phone]; Fax: [phone]

## 2018-03-16 ENCOUNTER — Ambulatory Visit: Payer: Medicare Other | Admitting: Cardiovascular Disease

## 2018-03-16 ENCOUNTER — Encounter: Payer: Self-pay | Admitting: Cardiovascular Disease

## 2018-03-16 VITALS — BP 124/72 | HR 49 | Ht 74.0 in | Wt 209.8 lb

## 2018-03-16 DIAGNOSIS — I87009 Postthrombotic syndrome without complications of unspecified extremity: Secondary | ICD-10-CM

## 2018-03-16 DIAGNOSIS — Z7901 Long term (current) use of anticoagulants: Secondary | ICD-10-CM

## 2018-03-16 DIAGNOSIS — E7800 Pure hypercholesterolemia, unspecified: Secondary | ICD-10-CM

## 2018-03-16 DIAGNOSIS — I2510 Atherosclerotic heart disease of native coronary artery without angina pectoris: Secondary | ICD-10-CM | POA: Diagnosis not present

## 2018-03-16 NOTE — Patient Instructions (Addendum)
Dr Sallyanne Kuster recommends that you schedule a follow-up appointment in 12 months. You will receive a reminder letter in the mail two months in advance. If you don't receive a letter, please call our office to schedule the follow-up appointment.  If you need a refill on your cardiac medications before your next appointment, please call your pharmacy.  Medication samples have been provided to the patient. Drug name: Xarelto 20 mg Qty: 28 tabs LOT: [id number] Exp.Date: 01/2020

## 2018-03-30 DIAGNOSIS — L570 Actinic keratosis: Secondary | ICD-10-CM | POA: Diagnosis not present

## 2018-03-30 DIAGNOSIS — D1801 Hemangioma of skin and subcutaneous tissue: Secondary | ICD-10-CM | POA: Diagnosis not present

## 2018-03-30 DIAGNOSIS — L814 Other melanin hyperpigmentation: Secondary | ICD-10-CM | POA: Diagnosis not present

## 2018-03-30 DIAGNOSIS — D225 Melanocytic nevi of trunk: Secondary | ICD-10-CM | POA: Diagnosis not present

## 2018-05-01 ENCOUNTER — Telehealth: Payer: Self-pay | Admitting: Cardiovascular Disease

## 2018-05-01 NOTE — Telephone Encounter (Signed)
New Message:   Patient calling the office for samples of medication:   1.  What medication and dosage are you requesting samples for? XARELTO  2.  Are you currently out of this medication? Yes

## 2018-05-01 NOTE — Telephone Encounter (Signed)
Spoke with pt. Adv pt that samples of Xarelto 20mg  daily will be left at the front desk for him to pick up.  Pt voiced appreciation and verbalized understanding.

## 2018-06-05 ENCOUNTER — Telehealth: Payer: Self-pay | Admitting: Cardiovascular Disease

## 2018-06-05 NOTE — Telephone Encounter (Signed)
Called and informed patient that xarelto 20 mg samples are waiting for pickup at the front desk of our [facility] office. Patient verbalized understanding. No further questions.

## 2018-06-05 NOTE — Telephone Encounter (Signed)
New Message:     Patient calling the office for samples of medication:   1.  What medication and dosage are you requesting samples for? XARELTO 20 MG TABS tablet  2.  Are you currently out of this medication? YES

## 2018-06-15 ENCOUNTER — Ambulatory Visit (INDEPENDENT_AMBULATORY_CARE_PROVIDER_SITE_OTHER): Payer: Self-pay

## 2018-06-15 ENCOUNTER — Ambulatory Visit (INDEPENDENT_AMBULATORY_CARE_PROVIDER_SITE_OTHER): Payer: Medicare Other | Admitting: Orthopedic Surgery

## 2018-06-15 ENCOUNTER — Encounter (INDEPENDENT_AMBULATORY_CARE_PROVIDER_SITE_OTHER): Payer: Self-pay | Admitting: Orthopedic Surgery

## 2018-06-15 VITALS — Ht 74.0 in | Wt 209.0 lb

## 2018-06-15 DIAGNOSIS — M79674 Pain in right toe(s): Secondary | ICD-10-CM

## 2018-06-15 DIAGNOSIS — M6701 Short Achilles tendon (acquired), right ankle: Secondary | ICD-10-CM | POA: Diagnosis not present

## 2018-06-15 DIAGNOSIS — I87321 Chronic venous hypertension (idiopathic) with inflammation of right lower extremity: Secondary | ICD-10-CM

## 2018-06-15 DIAGNOSIS — L84 Corns and callosities: Secondary | ICD-10-CM

## 2018-06-15 DIAGNOSIS — M2021 Hallux rigidus, right foot: Secondary | ICD-10-CM

## 2018-06-15 NOTE — Progress Notes (Signed)
Office Visit Note   Patient: [name]           Date of Birth: 1951-07-16           MRN: [id number] Visit Date: 06/15/2018              Requested by: Tonia Ghent, MD [address] [place], [place] [address] PCP: Tonia Ghent, MD  Chief Complaint  Patient presents with  . Right Foot - Pain      HPI: Patient is a 67 year old gentleman who was seen for initial evaluation for right forefoot pain.  Patient states he is status post surgery with Dr. Paulla Dolly.  Patient underwent a PIP fusion and Weil osteotomy for the second toe.  Patient has had persistent pain has a hard corn in the first webspace on the medial border of the PIP fusion.  Patient states that postoperatively he did develop a DVT in the right leg as well as a pulmonary embolus.  Patient is currently on Xarelto.  Patient works as a Environmental manager.  Assessment & Plan: Visit Diagnoses:  1. Toe pain, right   2. Idiopathic chronic venous hypertension of right lower extremity with inflammation   3. Achilles tendon contracture, right   4. Hallux rigidus, right foot   5. Corn of toe     Plan: Discussed with patient that we need to try conservative measures first.  Patient was given instructions for Achilles stretching.  Achilles stretching was demonstrated and he will do this 5 times a day a minute at a time.  Patient was given instructions for a stiff soled Trail running shoe such as [place].  Recommended knee-high compression stockings and reevaluate in 4 weeks.  Follow-Up Instructions: Return in about 4 weeks (around 07/13/2018).   Ortho Exam  Patient is alert, oriented, no adenopathy, well-dressed, normal affect, normal respiratory effort. Examination patient has a good dorsalis pedis and posterior tibial pulse.  He has heel cord tightness with his knee extended he has dorsiflexion 10 degrees short of neutral with his knee extended.  Patient has varicose veins with brawny skin color changes.  There are no venous  ulcers there is pitting edema.  Patient is tender to palpation over the corn over the medial border of the PIP fusion of the second toe he has hallux rigidus with essentially no range of motion of the great toe MTP joint.  He does have tenderness to palpation around the second and third MTP joints.  Imaging: Xr Toe 2nd Right  Result Date: 06/15/2018 3 view radiographs of the right forefoot shows a Weil osteotomy of the second metatarsal PIP fusion of the second toe with no bony overgrowth.  There is hallux rigidus with joint space collapse and bony spurs of the great toe MTP joint.  No images are attached to the encounter.  Labs: No results found for: HGBA1C, ESRSEDRATE, CRP, LABURIC, REPTSTATUS, GRAMSTAIN, CULT, LABORGA   Lab Results  Component Value Date   ALBUMIN 3.9 01/02/2017   ALBUMIN 4.1 10/01/2012   ALBUMIN 3.8 05/31/2011    Body mass index is 26.83 kg/m.  Orders:  Orders Placed This Encounter  Procedures  . XR Toe 2nd Right   No orders of the defined types were placed in this encounter.    Procedures: No procedures performed  Clinical Data: No additional findings.  ROS:  All other systems negative, except as noted in the HPI. Review of Systems  Objective: Vital Signs: Ht 6\' 2"  (1.88 m)  Wt 209 lb (94.8 kg)   BMI 26.83 kg/m   Specialty Comments:  No specialty comments available.  PMFS History: Patient Active Problem List   Diagnosis Date Noted  . Postphlebitic syndrome 03/15/2018  . Long term (current) use of anticoagulants 03/15/2018  . URI (upper respiratory infection) 10/20/2015  . Varicose veins 03/29/2015  . Back pain 11/15/2014  . Cellulitis of leg, right 08/21/2014  . CAD - minor - 30-40% LAD, 30% RCA 10/12 06/24/2013  . Altered mental status 10/01/2012  . Essential tremor 01/17/2012  . Chest pain 06/29/2011  . Pure hypercholesterolemia 06/29/2011  . Cough 05/28/2011  . Bladder cancer ([place]) 12/24/2010  . TRANSIENT ISCHEMIC ATTACKS,  HX OF 05/01/2010  . HEMATURIA, HX OF 05/01/2010  . UNSPECIFIED THROMBOCYTOPENIA 01/08/2010  . INGUINAL HERNIA, RIGHT 02/01/2009  . HYPERGLYCEMIA 08/30/2008  . CALF PAIN, LEFT 05/03/2008  . SHOULDER PAIN, LEFT 02/08/2008  . DVT (deep venous thrombosis) ([place]) 08/08/2007  . Acute thromboembolism of deep veins of lower extremity ([place]) 08/08/2007  . Alcohol abuse, history of 03/05/2007  . ANXIETY 12/30/2006  . DEPRESSION 12/30/2006  . HEMORRHOIDS, INTERNAL 09/27/2003   Past Medical History:  Diagnosis Date  . Alcohol abuse, unspecified    history of  . Anxiety   . Arthritis    all over  . Bladder cancer [facility]) 2011   Dr. Janice Norrie  . CAD (coronary artery disease) 2012   MI  . Depression   . Dyslipidemia   . History of DVT (deep vein thrombosis) 11/08 and again in 2014   previously on coumadin  . Hypertension   . Inguinal hernia without mention of obstruction or gangrene, unilateral or unspecified, (not specified as recurrent)   . Internal hemorrhoids without mention of complication   . Other abnormal glucose   . Personal history of other disorder of urinary system   . Stroke ([place]) 05/25/2005   TIA  . Thrombocytopenia, unspecified ([place])   . TIA (transient ischemic attack) 2008    Family History  Problem Relation Age of Onset  . Other Father        Hypotension from medication causing GI bleed  . Heart failure Father   . Emphysema Mother     Past Surgical History:  Procedure Laterality Date  . CARDIAC CATHETERIZATION  05/2011   mild atherosclerosis  . CYSTOSCOPY  07/12/2011   Procedure: CYSTOSCOPY;  Surgeon: Hanley Ben, MD;  Location: [facility];  Service: Urology;  Laterality: N/A;  one hour being requested for this case  . HERNIA REPAIR  8/11   repair of right indirect and direct hernias; incarerated umbilical hernia containing preperitoneal fat  . SHOULDER SURGERY  03/08/08   Left; arthroscopy  . TONSILLECTOMY     as child  . TRANSURETHRAL RESECTION OF BLADDER TUMOR   07/12/2011   Procedure: TRANSURETHRAL RESECTION OF BLADDER TUMOR (TURBT);  Surgeon: Hanley Ben, MD;  Location: [facility];  Service: Urology;  Laterality: N/A;   Social History   Occupational History  . Occupation: Environmental manager  Tobacco Use  . Smoking status: Former Smoker    Packs/day: 1.50    Years: 25.00    Pack years: 37.50    Types: Cigarettes    Last attempt to quit: 08/26/2004    Years since quitting: 13.8  . Smokeless tobacco: Former Network engineer and Sexual Activity  . Alcohol use: No    Alcohol/week: 0.0 standard drinks    Comment: Quit 2009  . Drug use: Yes    Frequency:  2.0 times per week    Types: Marijuana    Comment: MJ-regular use  10/2013 no longer uses  . Sexual activity: Yes

## 2018-06-16 ENCOUNTER — Encounter (INDEPENDENT_AMBULATORY_CARE_PROVIDER_SITE_OTHER): Payer: Self-pay | Admitting: Physician Assistant

## 2018-07-06 ENCOUNTER — Ambulatory Visit (INDEPENDENT_AMBULATORY_CARE_PROVIDER_SITE_OTHER): Payer: Medicare Other | Admitting: Orthopedic Surgery

## 2018-07-10 DIAGNOSIS — C670 Malignant neoplasm of trigone of bladder: Secondary | ICD-10-CM | POA: Diagnosis not present

## 2018-07-10 DIAGNOSIS — R102 Pelvic and perineal pain: Secondary | ICD-10-CM | POA: Diagnosis not present

## 2018-07-13 ENCOUNTER — Telehealth: Payer: Self-pay | Admitting: Cardiovascular Disease

## 2018-07-13 MED ORDER — RIVAROXABAN 20 MG PO TABS: ORAL_TABLET | ORAL | 1 refills | Status: DC

## 2018-07-13 NOTE — Telephone Encounter (Signed)
SPOKE TO PATIENT -- SAMPLES ARE AVAILABLE FOR PICK UP

## 2018-07-13 NOTE — Telephone Encounter (Signed)
New Message   Patient calling the office for samples of medication:   1.  What medication and dosage are you requesting samples for? XARELTO 20 MG TABS tablet  2.  Are you currently out of this medication? Yes, completely out.

## 2018-08-14 ENCOUNTER — Telehealth: Payer: Self-pay | Admitting: Cardiovascular Disease

## 2018-08-14 NOTE — Telephone Encounter (Signed)
  Patient calling the office for samples of medication:   1.  What medication and dosage are you requesting samples for? rivaroxaban (XARELTO) 20 MG TABS tablet  2.  Are you currently out of this medication? Completely out

## 2018-08-14 NOTE — Telephone Encounter (Signed)
Left a detailed message stating samples are available for pick up at the front desk.  Name: Xarelto 20 mg            QTY: 2 bottle  Lot # [id number] Expires: 6/21

## 2018-09-04 ENCOUNTER — Telehealth: Payer: Self-pay | Admitting: Cardiovascular Disease

## 2018-09-04 NOTE — Telephone Encounter (Signed)
Patient calling the office for samples of medication:   1.  What medication and dosage are you requesting samples for? xarellto  Not sure  2.  Are you currently out of this medication? Last one today

## 2018-09-04 NOTE — Telephone Encounter (Signed)
LMTCB---Xarelto 20 mg samples left at front desk for pt to pick up.

## 2018-10-16 ENCOUNTER — Telehealth: Payer: Self-pay | Admitting: Cardiovascular Disease

## 2018-10-16 NOTE — Telephone Encounter (Signed)
Called patient, notified that we had samples available.  Medication given: Xarelto 20 mg Lot #: [id number] Expiration: 06/2020 Qty: 3 bottles.

## 2018-10-16 NOTE — Telephone Encounter (Signed)
New Message   Patient calling the office for samples of medication:   1.  What medication and dosage are you requesting samples for?rivaroxaban (XARELTO) 20 MG TABS tablet   2.  Are you currently out of this medication? 2 days remaining

## 2018-11-09 DIAGNOSIS — L84 Corns and callosities: Secondary | ICD-10-CM | POA: Diagnosis not present

## 2018-11-09 DIAGNOSIS — R208 Other disturbances of skin sensation: Secondary | ICD-10-CM | POA: Diagnosis not present

## 2018-11-16 ENCOUNTER — Telehealth: Payer: Self-pay | Admitting: Cardiovascular Disease

## 2018-11-16 DIAGNOSIS — Z7901 Long term (current) use of anticoagulants: Secondary | ICD-10-CM

## 2018-11-16 NOTE — Telephone Encounter (Signed)
Needs recent repeat BMET and CBC. Last > 68 year old

## 2018-11-16 NOTE — Telephone Encounter (Signed)
Patient calling for samples. Pharmacist don't have samples available.

## 2018-11-16 NOTE — Telephone Encounter (Signed)
  Patient calling the office for samples of medication:   1.  What medication and dosage are you requesting samples for?  rivaroxaban (XARELTO) 20 MG TABS tablet  2.  Are you currently out of this medication? Has one more for today

## 2018-11-19 ENCOUNTER — Other Ambulatory Visit: Payer: Self-pay | Admitting: Cardiovascular Disease

## 2018-11-19 NOTE — Telephone Encounter (Signed)
Rx for 30days only. No refills.  Order for BMET and CBC mailed home. Patient to complete blood work prior to next refill authorization.

## 2018-11-19 NOTE — Addendum Note (Signed)
Addended by: Harrington Challenger on: 11/19/2018 01:21 PM   Modules accepted: Orders

## 2018-11-26 DIAGNOSIS — F3181 Bipolar II disorder: Secondary | ICD-10-CM | POA: Diagnosis not present

## 2018-11-26 DIAGNOSIS — F411 Generalized anxiety disorder: Secondary | ICD-10-CM | POA: Diagnosis not present

## 2018-12-01 ENCOUNTER — Telehealth: Payer: Self-pay | Admitting: Cardiovascular Disease

## 2018-12-01 NOTE — Telephone Encounter (Signed)
Can apply for patient assistance for Xarelto or send message to Raquel Sarna, SW with HF for use of [employer] funds to help with medication costs if appropriate (I have copied her on this message).

## 2018-12-01 NOTE — Telephone Encounter (Signed)
What to recommend for patients who call and we are out of samples?

## 2018-12-01 NOTE — Telephone Encounter (Signed)
I contacted patient, advised of message from PharmD.   Patient verbalized understanding.   I advised that someone else was copied on this message and hopes they could assist them. As patient states a 30 day supply was over $400.   He will await a call, and appreciates any help.

## 2018-12-01 NOTE — Telephone Encounter (Signed)
Patient calling the office for samples of medication:   1.  What medication and dosage are you requesting samples for? xaselto 20 mg  2.  Are you currently out of this medication? Yes

## 2018-12-02 ENCOUNTER — Telehealth (HOSPITAL_COMMUNITY): Payer: Self-pay | Admitting: Licensed Clinical Social Worker

## 2018-12-02 NOTE — Telephone Encounter (Signed)
CSW received referral from [facility] office to assist with medication assistance. Patient reports he had a co-pay of $400 for his Xarelto although he went to the pharmacy yesterday and got a 10 day supply and it only cost $12. CSW discussed possibility that patient has met his deductible through his insurance thus reduced co pay cost. CSW discussed Xarelto Patient Assistance application and possible eligibility. CSW emailed application and discussed needed documents. Patient will complete application and drop off to [facility] office for MD signature. Patient grateful for the assistance and will reach out to CSW if needed in the future. Raquel Sarna, [place], [place]

## 2018-12-24 ENCOUNTER — Ambulatory Visit (INDEPENDENT_AMBULATORY_CARE_PROVIDER_SITE_OTHER): Payer: Medicare Other | Admitting: Family Medicine

## 2018-12-24 ENCOUNTER — Encounter: Payer: Self-pay | Admitting: Family Medicine

## 2018-12-24 VITALS — BP 128/80

## 2018-12-24 DIAGNOSIS — G4453 Primary thunderclap headache: Secondary | ICD-10-CM

## 2018-12-24 DIAGNOSIS — G43109 Migraine with aura, not intractable, without status migrainosus: Secondary | ICD-10-CM

## 2018-12-24 DIAGNOSIS — J3489 Other specified disorders of nose and nasal sinuses: Secondary | ICD-10-CM

## 2018-12-24 DIAGNOSIS — R197 Diarrhea, unspecified: Secondary | ICD-10-CM | POA: Diagnosis not present

## 2018-12-24 MED ORDER — CLORAZEPATE DIPOTASSIUM 7.5 MG PO TABS: 7.5000 mg | ORAL_TABLET | Freq: Every evening | ORAL | Status: DC | PRN

## 2018-12-24 MED ORDER — ZOLPIDEM TARTRATE ER 12.5 MG PO TBCR: 12.5000 mg | EXTENDED_RELEASE_TABLET | Freq: Every evening | ORAL | Status: DC | PRN

## 2018-12-24 MED ORDER — SERTRALINE HCL 100 MG PO TABS: 100.0000 mg | ORAL_TABLET | Freq: Every day | ORAL | Status: DC

## 2018-12-24 NOTE — Progress Notes (Signed)
Interactive audio and video telecommunications were attempted between this provider and patient, however failed, due to patient having technical difficulties OR patient did not have access to video capability.  We continued and completed visit with audio only.   Virtual Visit via Telephone Note  I connected with patient on 12/24/18 at 4:28 PM by telephone and verified that I am speaking with the correct person using two identifiers.  Location of patient: home  Location of MD: [place] Name of referring provider (if blank then none associated): Names per persons and role in encounter:  MD: Earlyne Iba, Patient: name listed above.    I discussed the limitations, risks, security and privacy concerns of performing an evaluation and management service by telephone and the availability of in person appointments. I also discussed with the patient that there may be a patient responsible charge related to this service. The patient expressed understanding and agreed to proceed.  CC: HA  HPI:  Pt said has had 2 episodes of a thunder clap H/A, once in the midst of sex, one was not associated with sex. One 3 wks ago and another ~1 wk ago with sudden onset of intense H/A that lasted 15'-20'. pt rested and H/A went away on its own. No h/a, dizziness or disorientation today. no loss of use or weakness of extremities. The headaches were similar in severity and location.    H/o aura w/o headaches that predates these episodes.  Had been going on for years.  No migraines HA.  No SZ.  This seems to be a separate issue.  Today has runny nose. For 4-6 wks has on and off episodes of diarrhea and muscle aches but no mucus or blood in stool. Some normal BMs.  On and off also pain rt side 1/4" above waistline, with a brief twinge every few days- not constant. It goes away on its own and no pattern to pain. no covid exposure.  He feels well at the time of the call, no runny nose, no aches, no abd pain. No  fevers.  Normal sense of smell and taste. No fevers.  He has been doing strenuous work at home, which could contribute to aches and fatigue.    PMH and SH reviewed  ROS: Per HPI unless specifically indicated in ROS section   Meds and allergies reviewed.     Observations/Objective:nad Speech wnl.   Assessment and Plan: Thunderclap HA Needs CT head then consider neuro eval.  Routine cautions for ER given to patient. Doesn't need ER eval given timeline of prev HA.  He agrees with plan.   Likely ocular migraine, defer tx at this point.  Await CT head.  This seems to be a separate issue.    Defer w/u for abd sx at this point. He agrees.    He could have seasonal allergies causing rhinorrhea, d/w pt.  Resolved at time of call.    Follow Up Instructions:routine ER cautions given to patient.   I discussed the assessment and treatment plan with the patient. The patient was provided an opportunity to ask questions and all were answered. The patient agreed with the plan and demonstrated an understanding of the instructions.   The patient was advised to call back or seek an in-person evaluation if the symptoms worsen or if the condition fails to improve as anticipated.  I provided 25 minutes of non-face-to-face time during this encounter.  Elsie Stain, MD

## 2018-12-25 ENCOUNTER — Other Ambulatory Visit: Payer: Self-pay

## 2018-12-25 ENCOUNTER — Telehealth: Payer: Self-pay

## 2018-12-25 ENCOUNTER — Encounter: Payer: Self-pay | Admitting: Family Medicine

## 2018-12-25 ENCOUNTER — Ambulatory Visit
Admission: RE | Admit: 2018-12-25 | Discharge: 2018-12-25 | Disposition: A | Discharge status: Home / Self Care | Payer: Medicare Other | Source: Ambulatory Visit | Attending: Family Medicine | Admitting: Family Medicine

## 2018-12-25 DIAGNOSIS — G4453 Primary thunderclap headache: Secondary | ICD-10-CM

## 2018-12-25 DIAGNOSIS — R51 Headache: Secondary | ICD-10-CM | POA: Diagnosis not present

## 2018-12-25 DIAGNOSIS — J3489 Other specified disorders of nose and nasal sinuses: Secondary | ICD-10-CM | POA: Insufficient documentation

## 2018-12-25 DIAGNOSIS — G43109 Migraine with aura, not intractable, without status migrainosus: Secondary | ICD-10-CM | POA: Insufficient documentation

## 2018-12-25 DIAGNOSIS — R197 Diarrhea, unspecified: Secondary | ICD-10-CM | POA: Insufficient documentation

## 2018-12-25 NOTE — Assessment & Plan Note (Signed)
Needs CT head then consider neuro eval.  Routine cautions for ER given to patient. Doesn't need ER eval given timeline of prev HA.  He agrees with plan.

## 2018-12-25 NOTE — Assessment & Plan Note (Signed)
He could have seasonal allergies causing rhinorrhea, d/w pt.  Resolved at time of call.

## 2018-12-25 NOTE — Assessment & Plan Note (Addendum)
Episodic, no blood or mucous in stools.  Defer w/u for abd sx at this point. He agrees.  If sx continue, then we can w/u.  Thunderclap HA/getting head CT head is likely a bigger issue at this point, d/w pt.  He agrees.

## 2018-12-25 NOTE — Telephone Encounter (Signed)
Please let patient know.   Negative head CT. Great news.    Let me see about getting neuro eval set up.  I'm checking on options on that.  Thanks.

## 2018-12-25 NOTE — Telephone Encounter (Signed)
[name] CT [facility] called report on CT; pt waiting; taking report to Dr Damita Dunnings and report is in epic.

## 2018-12-25 NOTE — Telephone Encounter (Signed)
Pt notified as instructed and pt voiced understanding. Pt will wait for cb from [facility] and pt asked to call [phone] with referral info.

## 2018-12-25 NOTE — Assessment & Plan Note (Signed)
Likely ocular migraine, defer tx at this point.  Await CT head.  This seems to be a separate issue.

## 2018-12-27 NOTE — Telephone Encounter (Signed)
Thanks.  Referral is in EMR.

## 2018-12-28 NOTE — Telephone Encounter (Signed)
Patient just called me back and said he would like to see the Neurologist. Will call [facility] and set up visit.

## 2018-12-28 NOTE — Telephone Encounter (Signed)
Called patient to set up Neurology consult. Patient doesn't want to to go to a Neurologist at all. He asked me to cancel this referral. His original call started because he was afraid he had covid virus not because of his headache. No headaches since, call came from wanting a covid test. Is it ok to cancel the Neurology Referral?

## 2018-12-28 NOTE — Telephone Encounter (Signed)
If patient totally refuses to talk to neuro, then I'll defer to him.  Please tell him that I absolutely recommend him to at least talk to neuro.  Cancelling the referral is against medical advice, but I'll defer to him.  Thanks.

## 2018-12-28 NOTE — Telephone Encounter (Signed)
Thanks

## 2018-12-31 DIAGNOSIS — G4482 Headache associated with sexual activity: Secondary | ICD-10-CM | POA: Diagnosis not present

## 2019-01-06 ENCOUNTER — Telehealth: Payer: Self-pay | Admitting: Cardiovascular Disease

## 2019-01-06 ENCOUNTER — Other Ambulatory Visit: Payer: Medicare Other | Admitting: *Deleted

## 2019-01-06 ENCOUNTER — Other Ambulatory Visit: Payer: Self-pay

## 2019-01-06 DIAGNOSIS — Z7901 Long term (current) use of anticoagulants: Secondary | ICD-10-CM | POA: Diagnosis not present

## 2019-01-06 LAB — BASIC METABOLIC PANEL
BUN/Creatinine Ratio: 20 (ref 10–24)
BUN: 15 mg/dL (ref 8–27)
CALCIUM: 8.6 mg/dL (ref 8.6–10.2)
CO2: 22 mmol/L (ref 20–29)
Chloride: 103 mmol/L (ref 96–106)
Creatinine, Ser: 0.76 mg/dL (ref 0.76–1.27)
GFR calc Af Amer: 109 mL/min/{1.73_m2} (ref >59)
GFR calc non Af Amer: 94 mL/min/{1.73_m2} (ref >59)
Glucose: 114 mg/dL — ABNORMAL HIGH (ref 65–99)
Potassium: 4.2 mmol/L (ref 3.5–5.2)
Sodium: 137 mmol/L (ref 134–144)

## 2019-01-06 LAB — CBC
HEMOGLOBIN: 17.7 g/dL (ref 13.0–17.7)
Hematocrit: 51.5 % — ABNORMAL HIGH (ref 37.5–51.0)
MCH: 31.8 pg (ref 26.6–33.0)
MCHC: 34.4 g/dL (ref 31.5–35.7)
MCV: 93 fL (ref 79–97)
PLATELETS: 170 10*3/uL (ref 150–450)
RBC: 5.56 x10E6/uL (ref 4.14–5.80)
RDW: 11.9 % (ref 11.6–15.4)
WBC: 4.5 10*3/uL (ref 3.4–10.8)

## 2019-01-06 NOTE — Telephone Encounter (Signed)
°*  STAT* If patient is at the pharmacy, call can be transferred to refill team.   1. Which medications need to be refilled? (please list name of each medication and dose if known)  xarelto - has been out for 2 days , came in for labs this morning   2. Which pharmacy/location (including street and city if local pharmacy) is medication to be sent to? [facility] [phone] - [place], [place] - [place]  3. Do they need a 30 day or 90 day supply? [place]

## 2019-01-07 ENCOUNTER — Other Ambulatory Visit: Payer: Self-pay

## 2019-01-07 ENCOUNTER — Telehealth: Payer: Self-pay | Admitting: *Deleted

## 2019-01-07 MED ORDER — RIVAROXABAN 20 MG PO TABS: ORAL_TABLET | ORAL | 0 refills | Status: DC

## 2019-01-07 MED ORDER — RIVAROXABAN 20 MG PO TABS: ORAL_TABLET | ORAL | 3 refills | Status: DC

## 2019-01-07 NOTE — Telephone Encounter (Signed)
Pt called to check on the status of his refill. He is on day 4 without the medication

## 2019-01-07 NOTE — Telephone Encounter (Signed)
Patient was called with results and requested a refill on is Xarelto. He is currently out.

## 2019-01-08 ENCOUNTER — Other Ambulatory Visit: Payer: Self-pay | Admitting: Family Medicine

## 2019-01-08 DIAGNOSIS — D582 Other hemoglobinopathies: Secondary | ICD-10-CM

## 2019-01-08 NOTE — Telephone Encounter (Signed)
Refill sent yesterday (01/07/2019) for 90 day supply

## 2019-01-22 DIAGNOSIS — D751 Secondary polycythemia: Secondary | ICD-10-CM | POA: Insufficient documentation

## 2019-01-22 NOTE — Progress Notes (Deleted)
[facility]  Telephone:([phone]) [phone] Fax:(336) [phone]  ID: Rondel Baton OB: September 25, 1950  MR#: [id number]  JJO#:[id number]  Patient Care Team: Tonia Ghent, MD as PCP - General  CHIEF COMPLAINT: Polycythemia  INTERVAL HISTORY: ***  REVIEW OF SYSTEMS:   ROS  As per HPI. Otherwise, a complete review of systems is negative.  PAST MEDICAL HISTORY: Past Medical History:  Diagnosis Date  . Alcohol abuse, unspecified    history of  . Anxiety   . Arthritis    all over  . Bladder cancer [facility]) 2011   Dr. Janice Norrie  . CAD (coronary artery disease) 2012   MI  . Depression   . Dyslipidemia   . History of DVT (deep vein thrombosis) 11/08 and again in 2014   previously on coumadin  . Hypertension   . Inguinal hernia without mention of obstruction or gangrene, unilateral or unspecified, (not specified as recurrent)   . Internal hemorrhoids without mention of complication   . Other abnormal glucose   . Personal history of other disorder of urinary system   . Stroke ([place]) 05/25/2005   TIA  . Thrombocytopenia, unspecified ([place])   . TIA (transient ischemic attack) 2008    PAST SURGICAL HISTORY: Past Surgical History:  Procedure Laterality Date  . CARDIAC CATHETERIZATION  05/2011   mild atherosclerosis  . CYSTOSCOPY  07/12/2011   Procedure: CYSTOSCOPY;  Surgeon: Hanley Ben, MD;  Location: [facility];  Service: Urology;  Laterality: N/A;  one hour being requested for this case  . HERNIA REPAIR  8/11   repair of right indirect and direct hernias; incarerated umbilical hernia containing preperitoneal fat  . SHOULDER SURGERY  03/08/08   Left; arthroscopy  . TONSILLECTOMY     as child  . TRANSURETHRAL RESECTION OF BLADDER TUMOR  07/12/2011   Procedure: TRANSURETHRAL RESECTION OF BLADDER TUMOR (TURBT);  Surgeon: Hanley Ben, MD;  Location: [facility];  Service: Urology;  Laterality: N/A;    FAMILY HISTORY: Family History  Problem Relation Age of Onset  .  Other Father        Hypotension from medication causing GI bleed  . Heart failure Father   . Emphysema Mother     ADVANCED DIRECTIVES (Y/N):  N  HEALTH MAINTENANCE: Social History   Tobacco Use  . Smoking status: Former Smoker    Packs/day: 1.50    Years: 25.00    Pack years: 37.50    Types: Cigarettes    Last attempt to quit: 08/26/2004    Years since quitting: 14.4  . Smokeless tobacco: Former Network engineer Use Topics  . Alcohol use: No    Alcohol/week: 0.0 standard drinks    Comment: Quit 2009  . Drug use: Yes    Frequency: 2.0 times per week    Types: Marijuana    Comment: MJ-regular use  10/2013 no longer uses     Colonoscopy:  PAP:  Bone density:  Lipid panel:  Allergies  Allergen Reactions  . Crestor [Rosuvastatin]     Mood changes  . Escitalopram Oxalate     Mood changes, irritablity  . Latex Other (See Comments)    Blood in urine with latex catheter  . Sulfonamide Derivatives Rash    Current Outpatient Medications  Medication Sig Dispense Refill  . clorazepate (TRANXENE) 7.5 MG tablet Take 1 tablet (7.5 mg total) by mouth at bedtime as needed for sleep.    Marland Kitchen ibuprofen (ADVIL,MOTRIN) 200 MG tablet Take 400 mg by mouth daily as needed  for pain.     . Multiple Vitamin (MULTIVITAMIN WITH MINERALS) TABS tablet Take 1 tablet by mouth daily.    . rivaroxaban (XARELTO) 20 MG TABS tablet TAKE 1 TABLET BY MOUTH ONCE DAILY WITH A MEAL. PLEASE SCHEDULE MD APPT FOR FURTHER REFILLS 90 tablet 0  . sertraline (ZOLOFT) 100 MG tablet Take 1 tablet (100 mg total) by mouth daily.    Marland Kitchen zolpidem (AMBIEN CR) 12.5 MG CR tablet Take 1 tablet (12.5 mg total) by mouth at bedtime as needed for sleep.     No current facility-administered medications for this visit.     OBJECTIVE: There were no vitals filed for this visit.   There is no height or weight on file to calculate BMI.    ECOG FS:{CHL ONC [id number]  General: Well-developed, well-nourished, no acute distress.  Eyes: Pink conjunctiva, anicteric sclera. HEENT: Normocephalic, moist mucous membranes, clear oropharnyx. Lungs: Clear to auscultation bilaterally. Heart: Regular rate and rhythm. No rubs, murmurs, or gallops. Abdomen: Soft, nontender, nondistended. No organomegaly noted, normoactive bowel sounds. Musculoskeletal: No edema, cyanosis, or clubbing. Neuro: Alert, answering all questions appropriately. Cranial nerves grossly intact. Skin: No rashes or petechiae noted. Psych: Normal affect. Lymphatics: No cervical, calvicular, axillary or inguinal LAD.   LAB RESULTS:  Lab Results  Component Value Date   NA 137 01/06/2019   K 4.2 01/06/2019   CL 103 01/06/2019   CO2 22 01/06/2019   GLUCOSE 114 (H) 01/06/2019   BUN 15 01/06/2019   CREATININE 0.76 01/06/2019   CALCIUM 8.6 01/06/2019   PROT 6.9 01/02/2017   ALBUMIN 3.9 01/02/2017   AST 33 01/02/2017   ALT 37 01/02/2017   ALKPHOS 34 (L) 01/02/2017   BILITOT 0.7 01/02/2017   GFRNONAA 94 01/06/2019   GFRAA 109 01/06/2019    Lab Results  Component Value Date   WBC 4.5 01/06/2019   NEUTROABS 4.2 03/29/2013   HGB 17.7 01/06/2019   HCT 51.5 (H) 01/06/2019   MCV 93 01/06/2019   PLT 170 01/06/2019     STUDIES: Ct Head Wo Contrast  Result Date: 12/25/2018 CLINICAL DATA:  Thunderclap headache. No reported injury. History of DVT/PE on Xarelto anticoagulation. History of bladder cancer. EXAM: CT HEAD WITHOUT CONTRAST TECHNIQUE: Contiguous axial images were obtained from the base of the skull through the vertex without intravenous contrast. COMPARISON:  10/02/2012 head CT. FINDINGS: Brain: No evidence of parenchymal hemorrhage or extra-axial fluid collection. No mass lesion, mass effect, or midline shift. No CT evidence of acute infarction. Cerebral volume is age appropriate. No ventriculomegaly. Vascular: No acute abnormality. Skull: No evidence of calvarial fracture. Sinuses/Orbits: The visualized paranasal sinuses are essentially clear.  Other:  The mastoid air cells are unopacified. IMPRESSION: Negative head CT.  No evidence of acute intracranial abnormality. These results will be called to the ordering clinician or representative by the Radiology Department at the imaging location. Electronically Signed   By: Ilona Sorrel M.D.   On: 12/25/2018 14:50    ASSESSMENT: Polycythemia  PLAN:    1. Polycythemia:  Patient expressed understanding and was in agreement with this plan. He also understands that He can call clinic at any time with any questions, concerns, or complaints.   Cancer Staging No matching staging information was found for the patient.  Lloyd Huger, MD   01/22/2019 11:39 AM

## 2019-01-25 ENCOUNTER — Inpatient Hospital Stay: Payer: Medicare Other | Admitting: Oncology

## 2019-02-03 DIAGNOSIS — G4482 Headache associated with sexual activity: Secondary | ICD-10-CM | POA: Diagnosis not present

## 2019-02-15 ENCOUNTER — Telehealth: Payer: Self-pay | Admitting: Adult Health

## 2019-02-15 NOTE — Telephone Encounter (Signed)
No mychart, smartphone, consent, pre reg complete 02/15/19 AF

## 2019-02-18 ENCOUNTER — Ambulatory Visit (INDEPENDENT_AMBULATORY_CARE_PROVIDER_SITE_OTHER): Payer: Medicare Other | Admitting: Adult Health

## 2019-02-18 ENCOUNTER — Other Ambulatory Visit: Payer: Self-pay

## 2019-02-18 ENCOUNTER — Telehealth: Payer: Self-pay | Admitting: Radiology

## 2019-02-18 ENCOUNTER — Encounter: Payer: Self-pay | Admitting: Adult Health

## 2019-02-18 VITALS — BP 110/64 | HR 60 | Ht 74.0 in | Wt 209.0 lb

## 2019-02-18 DIAGNOSIS — R002 Palpitations: Secondary | ICD-10-CM

## 2019-02-18 DIAGNOSIS — R000 Tachycardia, unspecified: Secondary | ICD-10-CM

## 2019-02-18 DIAGNOSIS — G4453 Primary thunderclap headache: Secondary | ICD-10-CM

## 2019-02-18 DIAGNOSIS — Z79899 Other long term (current) drug therapy: Secondary | ICD-10-CM

## 2019-02-18 DIAGNOSIS — F411 Generalized anxiety disorder: Secondary | ICD-10-CM

## 2019-02-18 DIAGNOSIS — I82531 Chronic embolism and thrombosis of right popliteal vein: Secondary | ICD-10-CM

## 2019-02-18 MED ORDER — RIVAROXABAN 20 MG PO TABS: ORAL_TABLET | ORAL | 3 refills | Status: DC

## 2019-02-18 NOTE — Addendum Note (Signed)
Addended by: Waylan Rocher on: 02/18/2019 04:38 PM   Modules accepted: Orders

## 2019-02-18 NOTE — Progress Notes (Signed)
Cardiology Office Note   Date:  02/18/2019   ID:  [name], DOB September 15, 1950, MRN [id number]  PCP:  Tonia Ghent, MD  Cardiologist:  Dr. Sallyanne Kuster   No chief complaint on file.    History of Present Illness: [name] is a 68 y.o. male who presents for ongoing assessment and management of minor coronary artery disease per angiography in 2012, history of TIA, recurrent deep vein thrombosis of the lower extremities on lifelong anticoagulation with Xarelto, dyslipidemia, with other history to include anxiety peripheral venous insufficiency.  She was last seen by Dr. Sallyanne Kuster on 03/16/2018 denied chest pain, dyspnea on exertion, significant lower extremity edema or patient wears compression stockings on his right calf to avoid swelling which has been working well for him.  He is physically active, runs, rides a mountain bike.  Not had any falls or bleeding events on Xarelto.  As noted that he has intolerance to multiple statins most recently Crestor.  The patient has no desire to take any type of lipid-lowering agent.  The patient needs refills on Xarelto today.  He is here today complaining of significant amount of racing heart rate and palpitations.  He states that this happens every day, feeling his heart flipping, feels a rush of blood to his head, and feels uneasy.  He is medically compliant with Xarelto and denies any bleeding, hemoptysis, or melena.  He has some chronic back pain, but tries to remain active.  He also has a good deal of anxiety over his health.  He continues to smoke marijuana recreationally but does not drink alcohol or smoke cigarettes.  Past Medical History:  Diagnosis Date  . Alcohol abuse, unspecified    history of  . Anxiety   . Arthritis    all over  . Bladder cancer [facility]) 2011   Dr. Janice Norrie  . CAD (coronary artery disease) 2012   MI  . Depression   . Dyslipidemia   . History of DVT (deep vein thrombosis) 11/08 and again in 2014   previously on coumadin   . Hypertension   . Inguinal hernia without mention of obstruction or gangrene, unilateral or unspecified, (not specified as recurrent)   . Internal hemorrhoids without mention of complication   . Other abnormal glucose   . Personal history of other disorder of urinary system   . Stroke ([place]) 05/25/2005   TIA  . Thrombocytopenia, unspecified ([place])   . TIA (transient ischemic attack) 2008    Past Surgical History:  Procedure Laterality Date  . CARDIAC CATHETERIZATION  05/2011   mild atherosclerosis  . CYSTOSCOPY  07/12/2011   Procedure: CYSTOSCOPY;  Surgeon: Hanley Ben, MD;  [facility]: [facility];  Service: Urology;  Laterality: N/A;  one hour being requested for this case  . HERNIA REPAIR  8/11   repair of right indirect and direct hernias; incarerated umbilical hernia containing preperitoneal fat  . SHOULDER SURGERY  03/08/08   Left; arthroscopy  . TONSILLECTOMY     as child  . TRANSURETHRAL RESECTION OF BLADDER TUMOR  07/12/2011   Procedure: TRANSURETHRAL RESECTION OF BLADDER TUMOR (TURBT);  Surgeon: Hanley Ben, MD;  Location: [facility];  Service: Urology;  Laterality: N/A;     Current Outpatient Medications  Medication Sig Dispense Refill  . clorazepate (TRANXENE) 7.5 MG tablet Take 1 tablet (7.5 mg total) by mouth at bedtime as needed for sleep.    Marland Kitchen ibuprofen (ADVIL,MOTRIN) 200 MG tablet Take 400 mg by mouth daily as needed for pain.     Marland Kitchen  Multiple Vitamin (MULTIVITAMIN WITH MINERALS) TABS tablet Take 1 tablet by mouth daily.    . rivaroxaban (XARELTO) 20 MG TABS tablet TAKE 1 TABLET BY MOUTH ONCE DAILY WITH A MEAL. PLEASE SCHEDULE MD APPT FOR FURTHER REFILLS 90 tablet 3  . sertraline (ZOLOFT) 100 MG tablet Take 1 tablet (100 mg total) by mouth daily.    Marland Kitchen zolpidem (AMBIEN CR) 12.5 MG CR tablet Take 1 tablet (12.5 mg total) by mouth at bedtime as needed for sleep.     No current facility-administered medications for this visit.     Allergies:   Crestor [rosuvastatin],  Escitalopram oxalate, Latex, and Sulfonamide derivatives    Social History:  The patient  reports that he quit smoking about 14 years ago. His smoking use included cigarettes. He has a 37.50 pack-year smoking history. He has quit using smokeless tobacco. He reports current drug use. Frequency: 2.00 times per week. Drug: Marijuana. He reports that he does not drink alcohol.   Family History:  The patient's family history includes Emphysema in his mother; Heart failure in his father; Other in his father.    ROS: All other systems are reviewed and negative. Unless otherwise mentioned in H&P    PHYSICAL EXAM: VS:  BP 110/64   Pulse 60   Ht '6\' 2"'  (1.88 m)   Wt 209 lb (94.8 kg)   BMI 26.83 kg/m  , BMI Body mass index is 26.83 kg/m. GEN: Well nourished, well developed, in no acute distress HEENT: normal Neck: no JVD, carotid bruits, or masses Cardiac: RRR; no murmurs, rubs, or gallops,no edema  Respiratory:  Clear to auscultation bilaterally, normal work of breathing GI: soft, nontender, nondistended, + BS MS: no deformity or atrophy Skin: warm and dry, no rash Neuro:  Strength and sensation are intact Psych: euthymic mood, full affect   EKG: Not completed today..  Recent Labs: 01/06/2019: BUN 15; Creatinine, Ser 0.76; Hemoglobin 17.7; Platelets 170; Potassium 4.2; Sodium 137    Lipid Panel    Component Value Date/Time   CHOL 218 (HH) 08/29/2008 1235   TRIG 88 08/29/2008 1235   HDL 43.4 08/29/2008 1235   CHOLHDL 5.0 CALC 08/29/2008 1235   VLDL 18 08/29/2008 1235   LDLDIRECT 158.7 08/29/2008 1235      Wt Readings from Last 3 Encounters:  02/18/19 209 lb (94.8 kg)  06/15/18 209 lb (94.8 kg)  03/16/18 209 lb 12.8 oz (95.2 kg)    Other studies Reviewed: See scanned reports   ASSESSMENT AND PLAN:  1.Frequent palpitations: The patient is quite anxious about the symptoms.  He states he is feeling them every day.  I will place a cardiac monitor-ZIO AT for 7 days to  evaluate frequency and morphology of his rapid rhythm.  The patient will return to the office to discuss results.  I think this is more anxiety driven but will check to be sure he is not having PAF or SVT.  I will check a TSH, magnesium, and be met to be thorough to evaluate for other etiologies for his self-reported heart rhythm.  2.  History of DVT: He will continue on Xarelto as directed.  Refills are provided for him at 90 days each.  He denies any bleeding or excessive bruising.  The patient will have a CBC to evaluate for anemia causing arrhythmias.  3.  Anxiety: The patient becomes very upset and talks rapidly about his rapid heart rhythm, his anxiety over the news and COVID pandemic.  He is advised to  follow-up with his PCP about discussion and need for antianxiety medications and possible psychotherapy to help him with his current worries and anxiety.  4.  History of thunderclap headache: He had been on nortriptyline for a time and is no longer taking he is to follow-up with neurology for further recommendations if these recur.  Jory Sims DNP,ANP AACC

## 2019-02-18 NOTE — Patient Instructions (Addendum)
Medication Instructions:  The current medical regimen is effective;  continue present plan and medications as directed. Please refer to the Current Medication list given to you today. If you need a refill on your cardiac medications before your next appointment, please call your pharmacy.  Labwork: CBC,TSH,BMET AND MAG TODAY HERE IN OUR OFFICE AT [facility]   Take the provided lab slips with you to the lab for your blood draw.   When you have your labs (blood work) drawn today and your tests are completely normal, you will receive your results only by MyChart Message (if you have MyChart) -OR-  A paper copy in the mail.  If you have any lab test that is abnormal or we need to change your treatment, we will call you to review these results.  Testing/Procedures: Your physician has recommended that you wear a 7 DAY ZIO-PATCH monitor. The Zio patch cardiac monitor continuously records heart rhythm data for up to 14 days, this is for patients being evaluated for multiple types heart rhythms. For the first 24 hours post application, please avoid getting the Zio monitor wet in the shower or by excessive sweating during exercise. After that, feel free to carry on with regular activities. Keep soaps and lotions away from the ZIO XT Patch.  Our monitor staff will call and schedule. This will either be mailed or be placed at our [facility] location - [address], [address].      Follow-Up: You will need a follow up appointment in 1 months.  You may see Sanda Klein, MD, Jory Sims [facility] IF PRIMARY CARDIOLOGIST IS UNAVAILABLE  or one of the following Advanced Practice Providers on your designated Care Team: Almyra Deforest, PA-C Fabian Sharp, PA-C  At [facility], you and your health needs are our priority.  As part of our continuing mission to provide you with exceptional heart care, we have created designated Provider Care Teams.  These Care Teams include your primary Cardiologist (physician) and  Advanced Practice Providers (APPs -  Physician Assistants and Nurse Practitioners) who all work together to provide you with the care you need, when you need it.  Thank you for choosing [facility] at [facility]!!    .mya

## 2019-02-18 NOTE — Telephone Encounter (Signed)
Enrolled patient for a 7 day [employer] Event Monitor to be mailed. Brief instructions were gone over with the patient and he knows to expect the monitor to arrive in 3-4 days

## 2019-02-18 NOTE — Progress Notes (Signed)
Thanks, I agree. [name] is a very interesting guy

## 2019-02-19 ENCOUNTER — Telehealth: Payer: Self-pay | Admitting: Family Medicine

## 2019-02-19 LAB — CBC
Hematocrit: 48.6 % (ref 37.5–51.0)
Hemoglobin: 17.4 g/dL (ref 13.0–17.7)
MCH: 32.3 pg (ref 26.6–33.0)
MCHC: 35.8 g/dL — ABNORMAL HIGH (ref 31.5–35.7)
MCV: 90 fL (ref 79–97)
Platelets: 170 10*3/uL (ref 150–450)
RBC: 5.39 x10E6/uL (ref 4.14–5.80)
RDW: 12.5 % (ref 11.6–15.4)
WBC: 5.1 10*3/uL (ref 3.4–10.8)

## 2019-02-19 LAB — BASIC METABOLIC PANEL
BUN/Creatinine Ratio: 25 — ABNORMAL HIGH (ref 10–24)
BUN: 21 mg/dL (ref 8–27)
CO2: 21 mmol/L (ref 20–29)
Calcium: 9.3 mg/dL (ref 8.6–10.2)
Chloride: 104 mmol/L (ref 96–106)
Creatinine, Ser: 0.85 mg/dL (ref 0.76–1.27)
GFR calc Af Amer: 104 mL/min/{1.73_m2} (ref >59)
GFR calc non Af Amer: 90 mL/min/{1.73_m2} (ref >59)
Glucose: 81 mg/dL (ref 65–99)
Potassium: 4.4 mmol/L (ref 3.5–5.2)
Sodium: 137 mmol/L (ref 134–144)

## 2019-02-19 LAB — MAGNESIUM: Magnesium: 2.3 mg/dL (ref 1.6–2.3)

## 2019-02-19 LAB — TSH: TSH: 6.18 u[IU]/mL — ABNORMAL HIGH (ref 0.450–4.500)

## 2019-02-19 NOTE — Telephone Encounter (Signed)
See cardiology note about anxiety.  Please check with patient to see if he needs a follow-up here about anxiety, if so schedule 30-minute office visit.  Thanks.

## 2019-02-19 NOTE — Telephone Encounter (Signed)
Left message for patient to call back  

## 2019-02-22 ENCOUNTER — Telehealth: Payer: Self-pay | Admitting: Adult Health

## 2019-02-22 NOTE — Telephone Encounter (Signed)
Patient returned call for lab results.  

## 2019-02-22 NOTE — Telephone Encounter (Signed)
Patient informed of lab results and advised to continue with the plan of wearing the 7 day ZIO monitor when it arrives in the mail. Patient verbalized understanding. Lab results have been routed to patient's PCP for further evaluation. Patient states he will contact his PCP for next steps regarding his abnormal TSH levels. No further questions.

## 2019-02-23 NOTE — Telephone Encounter (Signed)
Appointment has been scheduled for thyroid issue (phone visit) and patient indicates this issue can probably be handled at that time as well.  12:30 on Thursday.

## 2019-02-24 NOTE — Telephone Encounter (Signed)
Noted. Thanks.

## 2019-02-25 ENCOUNTER — Ambulatory Visit (INDEPENDENT_AMBULATORY_CARE_PROVIDER_SITE_OTHER): Payer: Medicare Other

## 2019-02-25 ENCOUNTER — Ambulatory Visit (INDEPENDENT_AMBULATORY_CARE_PROVIDER_SITE_OTHER): Payer: Medicare Other | Admitting: Family Medicine

## 2019-02-25 DIAGNOSIS — Z8249 Family history of ischemic heart disease and other diseases of the circulatory system: Secondary | ICD-10-CM

## 2019-02-25 DIAGNOSIS — R7989 Other specified abnormal findings of blood chemistry: Secondary | ICD-10-CM | POA: Diagnosis not present

## 2019-02-25 DIAGNOSIS — R002 Palpitations: Secondary | ICD-10-CM

## 2019-02-25 DIAGNOSIS — R000 Tachycardia, unspecified: Secondary | ICD-10-CM | POA: Diagnosis not present

## 2019-02-25 DIAGNOSIS — Z659 Problem related to unspecified psychosocial circumstances: Secondary | ICD-10-CM | POA: Diagnosis not present

## 2019-02-25 NOTE — Progress Notes (Signed)
Interactive audio and video telecommunications were attempted between this provider and patient, however failed, due to patient having technical difficulties OR patient did not have access to video capability.  We continued and completed visit with audio only.   Virtual Visit via Telephone Note  I connected with patient on 02/25/19  at 1:04 PM  by telephone and verified that I am speaking with the correct person using two identifiers.  Location of patient: home.    Location of MD: [facility] Name of referring provider (if blank then none associated): Names per persons and role in encounter:  MD: Earlyne Iba, Patient: name listed above.    I discussed the limitations, risks, security and privacy concerns of performing an evaluation and management service by telephone and the availability of in person appointments. I also discussed with the patient that there may be a patient responsible charge related to this service. The patient expressed understanding and agreed to proceed.  CC: f/u.   History of Present Illness:  Mild inc in TSH.  D/w pt.  No neck mass noted by patient.  No dysphagia.   He had cards f/u in the meantime and has monitor eval pending.    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   He has home repairs that he needs to do.  "I've got about 40 things going on right now" and he is worried about all of that.  No SI/HI.    D/w pt about checking lipids given FH CAD.    Observations/Objective: nad Speech wnl  Assessment and Plan:  Abnormal TSH noted.  Pathophysiology discussed with patient.  Needs recheck TSH along with FT4.  This may or may not be contributing to his feeling of palpitations.  Discussed. He has cardiac monitor pending.   I will await the results on that.  Family history cardiac disease noted.   Recheck lipids pending.  Social stressors discussed with patient.  No suicidal homicidal intent and he is trying to work through all of the recent changes described above.  Still okay for outpatient follow-up.  Follow Up Instructions: see above, re: lab visit.    I discussed the assessment and treatment plan with the patient. The patient was provided an opportunity to ask questions and all were answered. The patient agreed with the plan and demonstrated an understanding of the instructions.   The patient was advised to call back or seek an in-person evaluation if the symptoms worsen or if the condition fails to improve as anticipated.  I provided 20 minutes of non-face-to-face time during this encounter.  Elsie Stain, MD

## 2019-02-27 DIAGNOSIS — R7989 Other specified abnormal findings of blood chemistry: Secondary | ICD-10-CM | POA: Insufficient documentation

## 2019-02-27 DIAGNOSIS — Z659 Problem related to unspecified psychosocial circumstances: Secondary | ICD-10-CM | POA: Insufficient documentation

## 2019-02-27 DIAGNOSIS — Z8249 Family history of ischemic heart disease and other diseases of the circulatory system: Secondary | ICD-10-CM | POA: Insufficient documentation

## 2019-02-27 NOTE — Assessment & Plan Note (Signed)
Social stressors discussed with patient.  No suicidal homicidal intent and he is trying to work through all of the recent changes described above.  Still okay for outpatient follow-up.

## 2019-02-27 NOTE — Assessment & Plan Note (Signed)
Abnormal TSH noted.  Pathophysiology discussed with patient.  Needs recheck TSH along with FT4.  This may or may not be contributing to his feeling of palpitations.  Discussed. He has cardiac monitor pending.   I will await the results on that.

## 2019-02-27 NOTE — Assessment & Plan Note (Signed)
Family history cardiac disease noted.  Recheck lipids pending.

## 2019-03-01 ENCOUNTER — Other Ambulatory Visit (INDEPENDENT_AMBULATORY_CARE_PROVIDER_SITE_OTHER): Payer: Medicare Other

## 2019-03-01 DIAGNOSIS — Z8249 Family history of ischemic heart disease and other diseases of the circulatory system: Secondary | ICD-10-CM

## 2019-03-01 DIAGNOSIS — R7989 Other specified abnormal findings of blood chemistry: Secondary | ICD-10-CM | POA: Diagnosis not present

## 2019-03-01 LAB — LIPID PANEL
Cholesterol: 186 mg/dL (ref 0–200)
HDL: 42.4 mg/dL (ref >39.00)
LDL Cholesterol: 122 mg/dL — ABNORMAL HIGH (ref 0–99)
NonHDL: 143.3
Total CHOL/HDL Ratio: 4
Triglycerides: 108 mg/dL (ref 0.0–149.0)
VLDL: 21.6 mg/dL (ref 0.0–40.0)

## 2019-03-01 LAB — TSH: TSH: 6.02 u[IU]/mL — ABNORMAL HIGH (ref 0.35–4.50)

## 2019-03-01 LAB — T4, FREE: Free T4: 0.6 ng/dL (ref 0.60–1.60)

## 2019-03-09 ENCOUNTER — Other Ambulatory Visit: Payer: Self-pay | Admitting: Family Medicine

## 2019-03-09 DIAGNOSIS — R7989 Other specified abnormal findings of blood chemistry: Secondary | ICD-10-CM

## 2019-03-17 ENCOUNTER — Other Ambulatory Visit: Payer: Self-pay

## 2019-03-19 ENCOUNTER — Telehealth: Payer: Self-pay | Admitting: Adult Health

## 2019-03-19 NOTE — Telephone Encounter (Signed)

## 2019-03-19 NOTE — Telephone Encounter (Signed)
LVM, reminding pt of is appt on 03-22-19 with Jory Sims. I also asked pt to call back to be pre-screened for COVID-19.

## 2019-03-20 DIAGNOSIS — I2510 Atherosclerotic heart disease of native coronary artery without angina pectoris: Secondary | ICD-10-CM | POA: Diagnosis not present

## 2019-03-21 NOTE — Progress Notes (Signed)
Cardiology Office Note   Date:  03/22/2019   ID:  [name], DOB Apr 12, 1951, MRN [id number]  PCP:  Tonia Ghent, MD  Cardiologist:  Dr.Croitoru  CC: Follow UP Palpitations    History of Present Illness: [name] is a 68 y.o. male who presents for ongoing assessment and management of recurrent deep vein thrombosis of the lower extremities on lifelong anticoagulation with Xarelto, dyslipidemia, minor coronary disease by angiography in 2012, history of TIA, chronic peripheral venous insufficiency and anxiety.  When seen last on 01/29/2019 he was complaining of significant amount of racing heart rate and palpitations which he felt every day.  Feeling his heart was "flipping" along with a rush of blood to his head and uneasiness feeling.  He was very anxious about his health and symptoms.  I placed a ZIO 7-day monitor to evaluate frequency and morphology of his rapid rhythm.  Also TSH and magnesium along with the BMET were ordered.   The cardiac monitor revealed that his dominant rhythm was normal sinus rhythm with normal circadian rhythm.  However he did have occasional monophasic PVCs and a pattern of bigeminy.  There was no evidence of ventricular tachycardia or atrial fibrillation.  His symptoms coincided with periods of ventricular bigeminy.  He was started on metoprolol succinate 12.5 mg daily.  He is here for follow-up to discuss his response to medications.  He was noted to have minimally elevated TSH at 6.02.  Primary care physician followed up on this and found that his T4 was normal and therefore did not indicate need for thyroid replacement medication.  Lipid panel revealed a total cholesterol 186, LDL was 122.  He was unable to tolerate higher doses of rosuvastatin.  His magnesium was 2.3.  He comes today continued to have palpitations but has not yet picked up metoprolol.  He goes into great detail about the amount of stress that he is under at this time.  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  He speaks at length about his roommate and how they are not getting along.  He believes this is the source of a lot of his stress.  Past Medical History:  Diagnosis Date  . Alcohol abuse, unspecified    history of  . Anxiety   . Arthritis    all over  . Bladder cancer [facility]) 2011   Dr. Janice Norrie  . CAD (coronary artery disease) 2012   MI  . Depression   . Dyslipidemia   . History of DVT (deep vein thrombosis) 11/08 and again in 2014   previously on coumadin  . Hypertension   . Inguinal hernia without mention of obstruction or gangrene, unilateral or unspecified, (not specified as recurrent)   . Internal hemorrhoids without mention of complication   . Other abnormal glucose   . Personal history of other disorder of urinary system   . Stroke ([place]) 05/25/2005   TIA  . Thrombocytopenia, unspecified ([place])   . TIA (transient ischemic attack) 2008    Past Surgical History:  Procedure Laterality Date  . CARDIAC CATHETERIZATION  05/2011   mild atherosclerosis  . CYSTOSCOPY  07/12/2011   Procedure: CYSTOSCOPY;  Surgeon: Hanley Ben, MD;  Location: [facility];  Service: Urology;  Laterality: N/A;  one hour being requested for this case  . HERNIA REPAIR  8/11   repair of right indirect and direct hernias; incarerated umbilical hernia containing preperitoneal fat  . SHOULDER  SURGERY  03/08/08   Left; arthroscopy  . TONSILLECTOMY     as child  . TRANSURETHRAL RESECTION OF BLADDER TUMOR  07/12/2011   Procedure: TRANSURETHRAL RESECTION OF BLADDER TUMOR (TURBT);  Surgeon: Hanley Ben, MD;  Location: [facility];  Service: Urology;  Laterality: N/A;     Current Outpatient Medications  Medication Sig Dispense Refill  . clorazepate (TRANXENE) 7.5 MG tablet Take 1 tablet (7.5 mg total) by mouth at bedtime as needed for sleep.    Marland Kitchen ibuprofen (ADVIL,MOTRIN) 200 MG tablet Take 400 mg by mouth daily as needed for pain.      . Multiple Vitamin (MULTIVITAMIN WITH MINERALS) TABS tablet Take 1 tablet by mouth daily.    . rivaroxaban (XARELTO) 20 MG TABS tablet TAKE 1 TABLET BY MOUTH ONCE DAILY WITH A MEAL. PLEASE SCHEDULE MD APPT FOR FURTHER REFILLS 90 tablet 3  . sertraline (ZOLOFT) 100 MG tablet Take 1 tablet (100 mg total) by mouth daily.    Marland Kitchen zolpidem (AMBIEN CR) 12.5 MG CR tablet Take 1 tablet (12.5 mg total) by mouth at bedtime as needed for sleep.    . metoprolol succinate (TOPROL XL) 25 MG 24 hr tablet Take 0.5 tablets (12.5 mg total) by mouth daily. 15 tablet 6   No current facility-administered medications for this visit.     Allergies:   Crestor [rosuvastatin], Escitalopram oxalate, Latex, and Sulfonamide derivatives    Social History:  The patient  reports that he quit smoking about 14 years ago. His smoking use included cigarettes. He has a 37.50 pack-year smoking history. He has quit using smokeless tobacco. He reports current drug use. Frequency: 2.00 times per week. Drug: Marijuana. He reports that he does not drink alcohol.   Family History:  The patient's family history includes Emphysema in his mother; Heart failure in his father; Other in his father.    ROS: All other systems are reviewed and negative. Unless otherwise mentioned in H&P    PHYSICAL EXAM: VS:  BP 140/80 (BP Location: Left Arm, Patient Position: Sitting, Cuff Size: Normal)   Pulse 62   Temp (!) 95.5 F (35.3 C)   Ht 6\' 2"  (1.88 m)   Wt 209 lb (94.8 kg)   BMI 26.83 kg/m  , BMI Body mass index is 26.83 kg/m. GEN: Well nourished, well developed, in no acute distress HEENT: normal Neck: no JVD, carotid bruits, or masses Cardiac: RRR; no murmurs, rubs, or gallops,no edema  Respiratory:  Clear to auscultation bilaterally, normal work of breathing GI: soft, nontender, nondistended, + BS MS: no deformity or atrophy Skin: warm and dry, no rash Neuro:  Strength and sensation are intact Psych: euthymic mood, full affect    EKG: Not ordered this office visit.   Recent Labs: 02/18/2019: BUN 21; Creatinine, Ser 0.85; Hemoglobin 17.4; Magnesium 2.3; Platelets 170; Potassium 4.4; Sodium 137 03/01/2019: TSH 6.02    Lipid Panel    Component Value Date/Time   CHOL 186 03/01/2019 1021   TRIG 108.0 03/01/2019 1021   HDL 42.40 03/01/2019 1021   CHOLHDL 4 03/01/2019 1021   VLDL 21.6 03/01/2019 1021   LDLCALC 122 (H) 03/01/2019 1021   LDLDIRECT 158.7 08/29/2008 1235      Wt Readings from Last 3 Encounters:  03/22/19 209 lb (94.8 kg)  02/18/19 209 lb (94.8 kg)  06/15/18 209 lb (94.8 kg)      Other studies Reviewed: Zio Monitor 7-day 03/17/2019  Dominant rhythm is normal sinus rhythm with normal circvadian rhythm.  Occasional  monomorphic PVCs are seen, often in a pattern of bigeminy.  No evidence of ventricular tachycardia or atrial fibrillation.  Symptoms appear to coincide with periods of ventricular bigeminy.   Occasional symptomatic PVCs in a pattern of bigeminy. Otherwise normal arrhythmia monitor.   ASSESSMENT AND PLAN:  1.  Abnormal ZIO monitor: This revealed PVCs and bigeminy, and one episode of rapid heart rhythm with 141 bpm.  I have explained to him the results of his ZIO monitor and given him a copy of the summarization.  He will begin on metoprolol 12.5 mg twice daily.  We will see him in 1 month for follow-up concerning his response to medication with another EKG.  2.  Significant psychological stress: The patient is to follow-up with PCP for recommendations and need for psychiatric counseling or adjustment of antidepressant medications.  The patient is unhappy with the medications that he is taking now.  May need referral.  3.   Current medicines are reviewed at length with the patient today.    Labs/ tests ordered today include: None [name]. [name], ANP, AACC   03/22/2019 11:02 AM    [place] [address] Office [phone] Fax  [phone]

## 2019-03-22 ENCOUNTER — Encounter: Payer: Self-pay | Admitting: Adult Health

## 2019-03-22 ENCOUNTER — Other Ambulatory Visit: Payer: Self-pay

## 2019-03-22 ENCOUNTER — Ambulatory Visit (INDEPENDENT_AMBULATORY_CARE_PROVIDER_SITE_OTHER): Payer: Medicare Other | Admitting: Adult Health

## 2019-03-22 VITALS — BP 140/80 | HR 62 | Temp 95.5°F | Ht 74.0 in | Wt 209.0 lb

## 2019-03-22 DIAGNOSIS — I2510 Atherosclerotic heart disease of native coronary artery without angina pectoris: Secondary | ICD-10-CM

## 2019-03-22 DIAGNOSIS — F419 Anxiety disorder, unspecified: Secondary | ICD-10-CM

## 2019-03-22 DIAGNOSIS — R002 Palpitations: Secondary | ICD-10-CM

## 2019-03-22 DIAGNOSIS — G4453 Primary thunderclap headache: Secondary | ICD-10-CM | POA: Diagnosis not present

## 2019-03-22 MED ORDER — METOPROLOL SUCCINATE ER 25 MG PO TB24: 12.5000 mg | ORAL_TABLET | Freq: Every day | ORAL | 6 refills | Status: DC

## 2019-03-22 MED ORDER — METOPROLOL TARTRATE 25 MG PO TABS: 12.5000 mg | ORAL_TABLET | Freq: Two times a day (BID) | ORAL | 6 refills | Status: DC

## 2019-03-22 NOTE — Progress Notes (Signed)
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 He often uses certain naturally occurring plant based therapies for relaxation.

## 2019-03-22 NOTE — Patient Instructions (Addendum)
Medication Instructions:  START- Metoprolol Succinate 12.5 mg daily  If you need a refill on your cardiac medications before your next appointment, please call your pharmacy.  Labwork: None Ordered   Testing/Procedures: None Ordered  Follow-Up: . Your physician recommends that you schedule a follow-up appointment in: 1 Months with Dr Sallyanne Kuster or Jory Sims, DNP  At [facility], you and your health needs are our priority.  As part of our continuing mission to provide you with exceptional heart care, we have created designated Provider Care Teams.  These Care Teams include your primary Cardiologist (physician) and Advanced Practice Providers (APPs -  Physician Assistants and Nurse Practitioners) who all work together to provide you with the care you need, when you need it.  Thank you for choosing [facility] at [facility]!!

## 2019-03-24 ENCOUNTER — Telehealth: Payer: Self-pay | Admitting: Family Medicine

## 2019-03-24 NOTE — Telephone Encounter (Signed)
Notify patient.  I saw the notes from cardiology.  If his current medications are insufficient to help him adequately treat his ongoing stressors, then we can refer him to psychiatry if needed.  Let me know if he needs a referral.  Thanks.

## 2019-03-24 NOTE — Telephone Encounter (Signed)
Left message for patient to call back  

## 2019-03-30 NOTE — Telephone Encounter (Signed)
LM for call back

## 2019-03-30 NOTE — Telephone Encounter (Signed)
Pt states that he wants to take the Zoloft 100mg  DAILY as prescribed and see if this helps his "stressors". Pt states that he has taken it everyday since his visit 7/27 with Bunnie Domino PA with Cards and feels a difference. Pt wants to give this a try and he will keep [place] posted and if that does not work then he will consider the Psychiatry referral that time.   Will send to Dr Damita Dunnings as Juluis Rainier

## 2019-04-24 NOTE — Progress Notes (Signed)
Virtual Visit via Video Note   This visit type was conducted due to national recommendations for restrictions regarding the COVID-19 Pandemic (e.g. social distancing) in an effort to limit this patient's exposure and mitigate transmission in our community.  Due to his co-morbid illnesses, this patient is at least at moderate risk for complications without adequate follow up.  This format is felt to be most appropriate for this patient at this time.  All issues noted in this document were discussed and addressed.  A limited physical exam was performed with this format.  Please refer to the patient's chart for his consent to telehealth for [facility].   Date:  04/26/2019   ID:  [name], DOB 02-28-1951, MRN [id number]  Patient Location: Home Provider Location: Home  PCP:  Tonia Ghent, MD  Cardiologist:  Sanda Klein, MD  Electrophysiologist:  None   Evaluation Performed:  Follow-Up Visit  Chief Complaint:    History of Present Illness:    [name] is a 68 y.o. male we are following for ongoing assessment and management of recurrent DVT of the lower extremities on lifelong anticoagulation with Xarelto, minor coronary artery disease by angiography in 2012, history of TIA, chronic peripheral venous insufficiency, dyslipidemia, with other history to include anxiety.  The patient had a ZIO 7-day monitor completed prior to last appointment with dominant rhythm sinus rhythm with normal circadian rhythm.  He did have occasional monophasic PVCs in a pattern of bigeminy.  As result of this he was started on metoprolol 12.5 mg daily.  On last office visit he had not started taking the metoprolol as directed.  He continued to have palpitations.  He also has a lot of stress concerning his job as he has been laid off and also has a roommate which he is not getting along with with constant arguments.  We are seeing him today to evaluate how metoprolol (if he is taking it) has helped  with his palpitations.  He comes today more subdued than prior visit.  He is started back taking sertraline daily as he had not been taking it consistently in the past.  He is also started taking the metoprolol 12.5 mg daily.  He states he has not had any significant palpitations that he has been aware of.  He stated that they are no longer as they are no longer bothering him as much.  He states that he does not pay them attention any longer.  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  He is overall improved.  The patient does not have symptoms concerning for COVID-19 infection (fever, chills, cough, or new shortness of breath).    Past Medical History:  Diagnosis Date  . Alcohol abuse, unspecified    history of  . Anxiety   . Arthritis    all over  . Bladder cancer [facility]) 2011   Dr. Janice Norrie  . CAD (coronary artery disease) 2012   MI  . Depression   . Dyslipidemia   . History of DVT (deep vein thrombosis) 11/08 and again in 2014   previously on coumadin  . Hypertension   . Inguinal hernia without mention of obstruction or gangrene, unilateral or unspecified, (not specified as recurrent)   . Internal hemorrhoids without mention of complication   . Other abnormal glucose   . Personal history of other disorder of urinary system   .  Stroke ([place]) 05/25/2005   TIA  . Thrombocytopenia, unspecified ([place])   . TIA (transient ischemic attack) 2008   Past Surgical History:  Procedure Laterality Date  . CARDIAC CATHETERIZATION  05/2011   mild atherosclerosis  . CYSTOSCOPY  07/12/2011   Procedure: CYSTOSCOPY;  Surgeon: Hanley Ben, MD;  Location: [facility];  Service: Urology;  Laterality: N/A;  one hour being requested for this case  . HERNIA REPAIR  8/11   repair of right indirect and direct hernias; incarerated umbilical hernia containing preperitoneal fat  . SHOULDER SURGERY   03/08/08   Left; arthroscopy  . TONSILLECTOMY     as child  . TRANSURETHRAL RESECTION OF BLADDER TUMOR  07/12/2011   Procedure: TRANSURETHRAL RESECTION OF BLADDER TUMOR (TURBT);  Surgeon: Hanley Ben, MD;  [facility];  Service: Urology;  Laterality: N/A;     Current Meds  Medication Sig  . clorazepate (TRANXENE) 7.5 MG tablet Take 1 tablet (7.5 mg total) by mouth at bedtime as needed for sleep.  Marland Kitchen ibuprofen (ADVIL,MOTRIN) 200 MG tablet Take 400 mg by mouth daily as needed for pain.   . metoprolol succinate (TOPROL XL) 25 MG 24 hr tablet Take 0.5 tablets (12.5 mg total) by mouth daily.  . Multiple Vitamin (MULTIVITAMIN WITH MINERALS) TABS tablet Take 1 tablet by mouth daily.  . rivaroxaban (XARELTO) 20 MG TABS tablet TAKE 1 TABLET BY MOUTH ONCE DAILY WITH A MEAL. PLEASE SCHEDULE MD APPT FOR FURTHER REFILLS  . sertraline (ZOLOFT) 100 MG tablet Take 1 tablet (100 mg total) by mouth daily.  Marland Kitchen zolpidem (AMBIEN CR) 12.5 MG CR tablet Take 1 tablet (12.5 mg total) by mouth at bedtime as needed for sleep.     Allergies:   Crestor [rosuvastatin], Escitalopram oxalate, Latex, and Sulfonamide derivatives   Social History   Tobacco Use  . Smoking status: Former Smoker    Packs/day: 1.50    Years: 25.00    Pack years: 37.50    Types: Cigarettes    Quit date: 08/26/2004    Years since quitting: 14.6  . Smokeless tobacco: Former Network engineer Use Topics  . Alcohol use: No    Alcohol/week: 0.0 standard drinks    Comment: Quit 2009  . Drug use: Yes    Frequency: 2.0 times per week    Types: Marijuana    Comment: MJ-regular use  10/2013 no longer uses     Family Hx: The patient's family history includes Emphysema in his mother; Heart failure in his father; Other in his father.  ROS:   Please see the history of present illness.    All other systems reviewed and are negative.   Prior CV studies:   The following studies were reviewed today: Zio Monitor 7-day 03/17/2019   Dominant rhythm is normal sinus rhythm with normal circvadian rhythm.  Occasional monomorphic PVCs are seen, often in a pattern of bigeminy.  No evidence of ventricular tachycardia or atrial fibrillation.  Symptoms appear to coincide with periods of ventricular bigeminy.  Occasional symptomatic PVCs in a pattern of bigeminy. Otherwise normal arrhythmia monitor.  Labs/Other Tests and Data Reviewed:    EKG:  No ECG reviewed.  Recent Labs: 02/18/2019: BUN 21; Creatinine, Ser 0.85; Hemoglobin 17.4; Magnesium 2.3; Platelets 170; Potassium 4.4; Sodium 137 03/01/2019: TSH 6.02   Recent Lipid Panel Lab Results  Component Value Date/Time   CHOL 186 03/01/2019 10:21 AM   TRIG 108.0 03/01/2019 10:21 AM   HDL 42.40 03/01/2019 10:21 AM   CHOLHDL  4 03/01/2019 10:21 AM   LDLCALC 122 (H) 03/01/2019 10:21 AM   LDLDIRECT 158.7 08/29/2008 12:35 PM    Wt Readings from Last 3 Encounters:  04/26/19 209 lb (94.8 kg)  03/22/19 209 lb (94.8 kg)  02/18/19 209 lb (94.8 kg)     Objective:    Vital Signs:  Ht 6\' 2"  (1.88 m)   Wt 209 lb (94.8 kg)   BMI 26.83 kg/m    VITAL SIGNS:  reviewed GEN:  no acute distress RESPIRATORY:  normal respiratory effort, symmetric expansion NEURO:  alert and oriented x 3, no obvious focal deficit PSYCH:  normal affect  ASSESSMENT & PLAN:    1.  Palpitations: He has no complaints of recurrent severe palpitations.  He occasionally has a few which he states are not bothering him and he does not pay a lot of attention to them any longer.  I will keep him on the metoprolol 12.5 mg daily as it may have done some suppression of his palpitations allowing him not to have awareness of them as he did in the past.  2.  Chronic depression with anxiety: He has started back taking sertraline regularly.  This is helped him to feel more calm and less reactive and emotional.  In the past office visit I asked him if he wished to be referred for counseling.  He is decided now that he  is taking his antidepressant medication he does not feel as if this will be something he wishes to pursue at this time.  He will continue to follow-up with his primary care for medication adjustments.  3.  History of DVT of the lower extremity: Continue lifelong anticoagulation with Xarelto.  4.  Dyslipidemia: Followed by PCP.  Currently not on any statin therapy at this time.  COVID-19 Education: The signs and symptoms of COVID-19 were discussed with the patient and how to seek care for testing (follow up with PCP or arrange E-visit).  The importance of social distancing was discussed today.  Time:   Today, I have spent 15  minutes with the patient with telehealth technology discussing the above problems.     Medication Adjustments/Labs and Tests Ordered: Current medicines are reviewed at length with the patient today.  Concerns regarding medicines are outlined above.   Tests Ordered: No orders of the defined types were placed in this encounter.   Medication Changes: No orders of the defined types were placed in this encounter.   Disposition:  Follow up 33months   Signed, Phill Myron. West Pugh, ANP, AACC  04/26/2019 11:27 AM    [facility]

## 2019-04-26 ENCOUNTER — Encounter: Payer: Self-pay | Admitting: Adult Health

## 2019-04-26 ENCOUNTER — Telehealth (INDEPENDENT_AMBULATORY_CARE_PROVIDER_SITE_OTHER): Payer: Medicare Other | Admitting: Adult Health

## 2019-04-26 VITALS — Ht 74.0 in | Wt 209.0 lb

## 2019-04-26 DIAGNOSIS — F32A Depression, unspecified: Secondary | ICD-10-CM

## 2019-04-26 DIAGNOSIS — F329 Major depressive disorder, single episode, unspecified: Secondary | ICD-10-CM

## 2019-04-26 DIAGNOSIS — R002 Palpitations: Secondary | ICD-10-CM | POA: Diagnosis not present

## 2019-04-26 DIAGNOSIS — F419 Anxiety disorder, unspecified: Secondary | ICD-10-CM

## 2019-04-26 DIAGNOSIS — E785 Hyperlipidemia, unspecified: Secondary | ICD-10-CM

## 2019-04-26 DIAGNOSIS — I82531 Chronic embolism and thrombosis of right popliteal vein: Secondary | ICD-10-CM

## 2019-04-26 NOTE — Patient Instructions (Signed)
Medication Instructions:  Continue current medications  If you need a refill on your cardiac medications before your next appointment, please call your pharmacy.  Labwork: None Ordered   Testing/Procedures: None Ordered  Follow-Up: You will need a follow up appointment in 6 months.  Please call our office 2 months in advance to schedule this appointment.  You may see Mihai Croitoru, MD or one of the following Advanced Practice Providers on your designated Care Team: Hao Meng, PA-C . Angela Duke, PA-C     At [facility], you and your health needs are our priority.  As part of our continuing mission to provide you with exceptional heart care, we have created designated Provider Care Teams.  These Care Teams include your primary Cardiologist (physician) and Advanced Practice Providers (APPs -  Physician Assistants and Nurse Practitioners) who all work together to provide you with the care you need, when you need it.  Thank you for choosing [facility] at [facility]!!     

## 2019-04-26 NOTE — Progress Notes (Signed)
Thanks, glad to hear things are chill

## 2019-05-08 ENCOUNTER — Other Ambulatory Visit: Payer: Self-pay

## 2019-05-08 ENCOUNTER — Emergency Department (HOSPITAL_COMMUNITY)
Admission: EM | Admit: 2019-05-08 | Discharge: 2019-05-09 | Disposition: A | Discharge status: Home / Self Care | Payer: Medicare Other | Attending: Emergency Medicine | Admitting: Emergency Medicine

## 2019-05-08 ENCOUNTER — Emergency Department (HOSPITAL_COMMUNITY): Payer: Medicare Other

## 2019-05-08 DIAGNOSIS — Z87891 Personal history of nicotine dependence: Secondary | ICD-10-CM | POA: Insufficient documentation

## 2019-05-08 DIAGNOSIS — R55 Syncope and collapse: Secondary | ICD-10-CM | POA: Insufficient documentation

## 2019-05-08 DIAGNOSIS — I2510 Atherosclerotic heart disease of native coronary artery without angina pectoris: Secondary | ICD-10-CM | POA: Insufficient documentation

## 2019-05-08 DIAGNOSIS — I10 Essential (primary) hypertension: Secondary | ICD-10-CM | POA: Diagnosis not present

## 2019-05-08 DIAGNOSIS — Z91040 Latex allergy status: Secondary | ICD-10-CM | POA: Diagnosis not present

## 2019-05-08 DIAGNOSIS — R112 Nausea with vomiting, unspecified: Secondary | ICD-10-CM | POA: Diagnosis not present

## 2019-05-08 DIAGNOSIS — Z79899 Other long term (current) drug therapy: Secondary | ICD-10-CM | POA: Diagnosis not present

## 2019-05-08 DIAGNOSIS — R531 Weakness: Secondary | ICD-10-CM | POA: Diagnosis not present

## 2019-05-08 DIAGNOSIS — R4020 Unspecified coma: Secondary | ICD-10-CM | POA: Diagnosis not present

## 2019-05-08 LAB — CBC WITH DIFFERENTIAL/PLATELET
Abs Immature Granulocytes: 0.02 10*3/uL (ref 0.00–0.07)
Basophils Absolute: 0 10*3/uL (ref 0.0–0.1)
Basophils Relative: 1 %
Eosinophils Absolute: 0.1 10*3/uL (ref 0.0–0.5)
Eosinophils Relative: 1 %
HCT: 50.7 % (ref 39.0–52.0)
Hemoglobin: 17.4 g/dL — ABNORMAL HIGH (ref 13.0–17.0)
Immature Granulocytes: 0 %
Lymphocytes Relative: 22 %
Lymphs Abs: 1.4 10*3/uL (ref 0.7–4.0)
MCH: 33.1 pg (ref 26.0–34.0)
MCHC: 34.3 g/dL (ref 30.0–36.0)
MCV: 96.6 fL (ref 80.0–100.0)
Monocytes Absolute: 0.6 10*3/uL (ref 0.1–1.0)
Monocytes Relative: 10 %
Neutro Abs: 4.4 10*3/uL (ref 1.7–7.7)
Neutrophils Relative %: 66 %
Platelets: 147 10*3/uL — ABNORMAL LOW (ref 150–400)
RBC: 5.25 MIL/uL (ref 4.22–5.81)
RDW: 12.6 % (ref 11.5–15.5)
WBC: 6.6 10*3/uL (ref 4.0–10.5)
nRBC: 0 % (ref 0.0–0.2)

## 2019-05-08 LAB — CBG MONITORING, ED: Glucose-Capillary: 139 mg/dL — ABNORMAL HIGH (ref 70–99)

## 2019-05-08 LAB — LIPASE, BLOOD: Lipase: 39 U/L (ref 11–51)

## 2019-05-08 LAB — TROPONIN I (HIGH SENSITIVITY): Troponin I (High Sensitivity): 4 ng/L (ref <18)

## 2019-05-08 LAB — COMPREHENSIVE METABOLIC PANEL
ALT: 33 U/L (ref 0–44)
AST: 36 U/L (ref 15–41)
Albumin: 3.8 g/dL (ref 3.5–5.0)
Alkaline Phosphatase: 39 U/L (ref 38–126)
Anion gap: 8 (ref 5–15)
BUN: 20 mg/dL (ref 8–23)
CO2: 22 mmol/L (ref 22–32)
Calcium: 8.9 mg/dL (ref 8.9–10.3)
Chloride: 106 mmol/L (ref 98–111)
Creatinine, Ser: 0.93 mg/dL (ref 0.61–1.24)
GFR calc Af Amer: >60 mL/min (ref >60)
GFR calc non Af Amer: >60 mL/min (ref >60)
Glucose, Bld: 132 mg/dL — ABNORMAL HIGH (ref 70–99)
Potassium: 4.1 mmol/L (ref 3.5–5.1)
Sodium: 136 mmol/L (ref 135–145)
Total Bilirubin: 0.7 mg/dL (ref 0.3–1.2)
Total Protein: 7.3 g/dL (ref 6.5–8.1)

## 2019-05-08 MED ORDER — LORAZEPAM 2 MG/ML IJ SOLN
0.5000 mg | Freq: Once | INTRAMUSCULAR | Status: AC
  Administered 2019-05-08: 0.5 mg via INTRAVENOUS
  Filled 2019-05-08: qty 1

## 2019-05-08 MED ORDER — SODIUM CHLORIDE 0.9 % IV BOLUS
1000.0000 mL | Freq: Once | INTRAVENOUS | Status: AC
  Administered 2019-05-08: 1000 mL via INTRAVENOUS

## 2019-05-08 NOTE — ED Provider Notes (Signed)
N, V, vasaovagal ? Syncope Hyperventilating, "stroke"  Orthostatics, CT head, EKG, troponin Received 0.5 mg Ativan IV Will need delta trop, review work up - anticipate discharge home.  On introduction and reassessment, the patient is feeling significantly better after receiving Ativan for symptoms of anxiety.   Head CT negative. Troponin x 2 negative for any indication of ischemia. Syncope likely vasovagal with nausea and vomiting.    He can be discharged home per plan of previous treatment team. Patient comfortable with discharge home.       Charlann Lange, PA-C 05/09/19 0325    Davonna Belling, MD 05/09/19 [phone]

## 2019-05-08 NOTE — ED Provider Notes (Signed)
[facility] EMERGENCY DEPARTMENT Provider Note   CSN: [id number] Arrival date & time: 05/08/19  2139     History   Chief Complaint Chief Complaint  Patient presents with  . Weakness    HPI [name] is a 68 y.o. male.  Who presents emergency department with chief complaint of vomiting and syncope.  Patient has a past medical history of previous TIA, MI.  Patient was at home with his housemate when he suddenly became extremely nauseated.  He had onset of nonbloody nonbilious vomitus.  He became diaphoretic and weak in his upper extremities.  He felt like he could not stand up.  His partner helped him get into a warm bath.  He did not feel any better and vomited into the toilet again.  His partner helped him get out of the bathtub.  He had an episode of syncope for just a few seconds.  He did not fall or hit his head.  Upon arrival the patient is feeling extremely weak all over.  He is very obviously hyperventilating.  He denies chest pain, abdominal pain.  He states that he has had very bad reflux for almost the entire year and has been taking Tums "like candy.".  He denies any exertional dyspnea or exertional chest pain.  He states that it is reflux symptoms.  He has had a couple dark tarry stools but states only after he eats Oreo cookies and that has not been recent.  Patient is on Xarelto for previous DVT.     HPI  Past Medical History:  Diagnosis Date  . Alcohol abuse, unspecified    history of  . Anxiety   . Arthritis    all over  . Bladder cancer [facility]) 2011   Dr. Janice Norrie  . CAD (coronary artery disease) 2012   MI  . Depression   . Dyslipidemia   . History of DVT (deep vein thrombosis) 11/08 and again in 2014   previously on coumadin  . Hypertension   . Inguinal hernia without mention of obstruction or gangrene, unilateral or unspecified, (not specified as recurrent)   . Internal hemorrhoids without mention of complication   . Other abnormal glucose    . Personal history of other disorder of urinary system   . Stroke ([place]) 05/25/2005   TIA  . Thrombocytopenia, unspecified ([place])   . TIA (transient ischemic attack) 2008    Patient Active Problem List   Diagnosis Date Noted  . Other social stressor 02/27/2019  . FH: CAD (coronary artery disease) 02/27/2019  . Abnormal TSH 02/27/2019  . Polycythemia 01/22/2019  . [place]clap headache 12/25/2018  . Ocular migraine 12/25/2018  . Rhinorrhea 12/25/2018  . Diarrhea 12/25/2018  . Postphlebitic syndrome 03/15/2018  . Long term (current) use of anticoagulants 03/15/2018  . Varicose veins 03/29/2015  . Back pain 11/15/2014  . CAD - minor - 30-40% LAD, 30% RCA 10/12 06/24/2013  . Essential tremor 01/17/2012  . Pure hypercholesterolemia 06/29/2011  . Bladder cancer ([place]) 12/24/2010  . TRANSIENT ISCHEMIC ATTACKS, HX OF 05/01/2010  . HEMATURIA, HX OF 05/01/2010  . UNSPECIFIED THROMBOCYTOPENIA 01/08/2010  . HYPERGLYCEMIA 08/30/2008  . SHOULDER PAIN, LEFT 02/08/2008  . DVT (deep venous thrombosis) ([place]) 08/08/2007  . Acute thromboembolism of deep veins of lower extremity ([place]) 08/08/2007  . Alcohol abuse, history of 03/05/2007  . Anxiety state 12/30/2006  . DEPRESSION 12/30/2006    Past Surgical History:  Procedure Laterality Date  . CARDIAC CATHETERIZATION  05/2011   mild  atherosclerosis  . CYSTOSCOPY  07/12/2011   Procedure: CYSTOSCOPY;  Surgeon: Hanley Ben, MD;  Location: [facility]S;  Service: Urology;  Laterality: N/A;  one hour being requested for this case  . HERNIA REPAIR  8/11   repair of right indirect and direct hernias; incarerated umbilical hernia containing preperitoneal fat  . SHOULDER SURGERY  03/08/08   Left; arthroscopy  . TONSILLECTOMY     as child  . TRANSURETHRAL RESECTION OF BLADDER TUMOR  07/12/2011   Procedure: TRANSURETHRAL RESECTION OF BLADDER TUMOR (TURBT);  Surgeon: Hanley Ben, MD;  Location: [facility];  Service: Urology;  Laterality: N/A;         Home Medications    Prior to Admission medications   Medication Sig Start Date End Date Taking? Authorizing Provider  clorazepate (TRANXENE) 7.5 MG tablet Take 1 tablet (7.5 mg total) by mouth at bedtime as needed for sleep. 12/24/18  Yes Tonia Ghent, MD  ibuprofen (ADVIL,MOTRIN) 200 MG tablet Take 400 mg by mouth daily as needed for pain.    Yes [provider]  Multiple Vitamin (MULTIVITAMIN WITH MINERALS) TABS tablet Take 1 tablet by mouth daily.   Yes [provider]  rivaroxaban (XARELTO) 20 MG TABS tablet TAKE 1 TABLET BY MOUTH ONCE DAILY WITH A MEAL. PLEASE SCHEDULE MD APPT FOR FURTHER REFILLS Patient taking differently: Take 20 mg by mouth every evening.  02/18/19  Yes Lendon Colonel, NP  sertraline (ZOLOFT) 100 MG tablet Take 1 tablet (100 mg total) by mouth daily. 12/24/18  Yes Tonia Ghent, MD  zolpidem (AMBIEN CR) 12.5 MG CR tablet Take 1 tablet (12.5 mg total) by mouth at bedtime as needed for sleep. 12/24/18  Yes Tonia Ghent, MD  LORazepam (ATIVAN) 0.5 MG tablet Take 1 tablet (0.5 mg total) by mouth every 8 (eight) hours as needed for anxiety. 05/09/19   Charlann Lange, PA-C  metoprolol succinate (TOPROL XL) 25 MG 24 hr tablet Take 0.5 tablets (12.5 mg total) by mouth daily. Patient not taking: Reported on 05/08/2019 03/22/19   Lendon Colonel, NP    Family History Family History  Problem Relation Age of Onset  . Other Father        Hypotension from medication causing GI bleed  . Heart failure Father   . Emphysema Mother     Social History Social History   Tobacco Use  . Smoking status: Former Smoker    Packs/day: 1.50    Years: 25.00    Pack years: 37.50    Types: Cigarettes    Quit date: 08/26/2004    Years since quitting: 14.7  . Smokeless tobacco: Former Network engineer Use Topics  . Alcohol use: No    Alcohol/week: 0.0 standard drinks    Comment: Quit 2009  . Drug use: Yes    Frequency: 2.0 times per week    Types:  Marijuana    Comment: MJ-regular use  10/2013 no longer uses     Allergies   Crestor [rosuvastatin], Escitalopram oxalate, Latex, and Sulfonamide derivatives   Review of Systems Review of Systems Ten systems reviewed and are negative for acute change, except as noted in the HPI.    Physical Exam Updated Vital Signs BP (!) 153/96 (BP Location: Right Arm)   Pulse (!) 56   Temp 97.7 F (36.5 C) (Oral)   Resp 15   Ht 6\' 2"  (1.88 m)   Wt 94.8 kg   SpO2 97%   BMI 26.83 kg/m  Physical Exam Vitals signs and nursing note reviewed.  Constitutional:      General: He is not in acute distress.    Appearance: He is well-developed. He is not diaphoretic.  HENT:     Head: Normocephalic and atraumatic.  Eyes:     General: No scleral icterus.    Conjunctiva/sclera: Conjunctivae normal.  Neck:     Musculoskeletal: Normal range of motion and neck supple.  Cardiovascular:     Rate and Rhythm: Normal rate and regular rhythm.     Heart sounds: Normal heart sounds.  Pulmonary:     Breath sounds: Normal breath sounds.     Comments: Hyperventilating/redirectable Abdominal:     Palpations: Abdomen is soft.     Tenderness: There is no abdominal tenderness.  Skin:    General: Skin is warm and dry.  Neurological:     Mental Status: He is alert.     Comments: Speech is clear and goal oriented, follows commands Major Cranial nerves without deficit, no facial droop Normal strength in upper and lower extremities bilaterally including dorsiflexion and plantar flexion, strong and equal grip strength Sensation normal to light and sharp touch Moves extremities without ataxia, coordination intact Normal finger to nose and rapid alternating movements Neg romberg, no pronator drift Normal gait Normal heel-shin and balance   Psychiatric:        Behavior: Behavior normal.     Comments: Anxious      ED Treatments / Results  Labs (all labs ordered are listed, but only abnormal results are  displayed) Labs Reviewed  COMPREHENSIVE METABOLIC PANEL - Abnormal; Notable for the following components:      Result Value   Glucose, Bld 132 (*)    All other components within normal limits  CBC WITH DIFFERENTIAL/PLATELET - Abnormal; Notable for the following components:   Hemoglobin 17.4 (*)    Platelets 147 (*)    All other components within normal limits  URINALYSIS, ROUTINE W REFLEX MICROSCOPIC - Abnormal; Notable for the following components:   APPearance HAZY (*)    All other components within normal limits  CBG MONITORING, ED - Abnormal; Notable for the following components:   Glucose-Capillary 139 (*)    All other components within normal limits  LIPASE, BLOOD  TROPONIN I (HIGH SENSITIVITY)  TROPONIN I (HIGH SENSITIVITY)    EKG EKG Interpretation  Date/Time:  Saturday May 08 2019 21:48:40 EDT Ventricular Rate:  52 PR Interval:    QRS Duration: 131 QT Interval:  474 QTC Calculation: 441 R Axis:   -66 Text Interpretation:  Sinus rhythm RBBB and LAFB Probable anteroseptal infarct, old Minimal ST elevation, lateral leads same as earlier Confirmed by Merrily Pew [phone]) on 05/09/2019 3:23:41 AM   Radiology Ct Head Wo Contrast  Result Date: 05/08/2019 CLINICAL DATA:  Altered level of consciousness. Sudden onset of weakness of bilateral arms. Nausea and vomiting. EXAM: CT HEAD WITHOUT CONTRAST TECHNIQUE: Contiguous axial images were obtained from the base of the skull through the vertex without intravenous contrast. COMPARISON:  Head CT 12/25/2018, 10/02/2012 FINDINGS: Brain: No intracranial hemorrhage, mass effect, or midline shift. Brain volume is normal for age. No hydrocephalus. The basilar cisterns are patent. No evidence of territorial infarct or acute ischemia. No extra-axial or intracranial fluid collection. Vascular: Atherosclerosis of skullbase vasculature without hyperdense vessel or abnormal calcification. Skull: No fracture or focal lesion. Sinuses/Orbits:  Paranasal sinuses and mastoid air cells are clear. The visualized orbits are unremarkable. Other: None. IMPRESSION: Unremarkable noncontrast head CT. Electronically  Signed   By: Keith Rake M.D.   On: 05/08/2019 23:25    Procedures Procedures (including critical care time)  Medications Ordered in ED Medications  LORazepam (ATIVAN) injection 0.5 mg (0.5 mg Intravenous Given 05/08/19 2239)  sodium chloride 0.9 % bolus 1,000 mL (0 mLs Intravenous Stopped 05/09/19 0317)     Initial Impression / Assessment and Plan / ED Course  I have reviewed the triage vital signs and the nursing notes.  Pertinent labs & imaging results that were available during my care of the patient were reviewed by me and considered in my medical decision making (see chart for details).  Clinical Course as of May 09 1519  Sun May 09, 2019  0044 Troponin I (High Sensitivity): 4 [AH]  0044 Lipase: 39 [AH]  0044 Hemoglobin(!): 17.4 [AH]  0044 Glucose(!): 132 [AH]  0044 Calcium: 8.9 [AH]  [id number] Glucose-Capillary(!): 139 [AH]    Clinical Course User Index [AH] Margarita Mail, PA-C       68 year old male here with vomiting, syncope, feeling weak all over, no focal neurologic deficits on my examination. The differential for syncope is extensive and includes, but is not limited to: arrythmia (Vtach, SVT, SSS, sinus arrest, AV block, bradycardia) aortic stenosis, AMI, HOCM, PE, atrial myxoma, pulmonary hypertension, orthostatic hypotension, (hypovolemia, drug effect, GB syndrome, micturition, cough, swall) carotid sinus sensitivity, Seizure, TIA/CVA, hypoglycemia,  Vertigo. I suspect the patient had vasovagal response secondary to vomiting however he does have a cardiac history and will evaluate for ACS.  Reviewed the patient's labs which show a negative urinalysis, CMP shows slightly elevated blood glucose.  CBC shows elevated hemoglobin.  Patient CT head shows no significant abnormalities.  Initial troponin is  negative.  He will need a delta troponin.  Have given signout to PA up still who will assume care of the patient.  EKG is unchanged from previous tracings.  Final Clinical Impressions(s) / ED Diagnoses   Final diagnoses:  Vasovagal syncope  Non-intractable vomiting with nausea, unspecified vomiting type    ED Discharge Orders         Ordered    LORazepam (ATIVAN) 0.5 MG tablet  Every 8 hours PRN     05/09/19 0321           Margarita Mail, PA-C 05/09/19 1520    Davonna Belling, MD 05/09/19 2327

## 2019-05-08 NOTE — ED Notes (Signed)
Pt aware of need for urine specimen. 

## 2019-05-08 NOTE — ED Triage Notes (Signed)
Pt with complaint of sudden onset of weakness of bil arms. Endorses nausea/vomiting. Pt appears anxious on arrival to tx room.

## 2019-05-08 NOTE — ED Notes (Signed)
Manual bp  L 154/84 R 132/78

## 2019-05-08 NOTE — ED Notes (Signed)
Family at bedside. 

## 2019-05-09 LAB — TROPONIN I (HIGH SENSITIVITY): Troponin I (High Sensitivity): 4 ng/L (ref <18)

## 2019-05-09 LAB — URINALYSIS, ROUTINE W REFLEX MICROSCOPIC
Bilirubin Urine: NEGATIVE
Glucose, UA: NEGATIVE mg/dL
Hgb urine dipstick: NEGATIVE
Ketones, ur: NEGATIVE mg/dL
Leukocytes,Ua: NEGATIVE
Nitrite: NEGATIVE
Protein, ur: NEGATIVE mg/dL
Specific Gravity, Urine: 1.012 (ref 1.005–1.030)
pH: 8 (ref 5.0–8.0)

## 2019-05-09 MED ORDER — LORAZEPAM 0.5 MG PO TABS: 0.5000 mg | ORAL_TABLET | Freq: Three times a day (TID) | ORAL | 0 refills | Status: DC | PRN

## 2019-05-09 NOTE — ED Notes (Signed)
Pt ambulatory around room with steady gait, provider made aware

## 2019-05-09 NOTE — Discharge Instructions (Addendum)
Take Ativan as needed up to 3 times daily.  Follow up with your doctor for further evaluation of symptoms. REturn to the emergency department with any new or concerning symptoms.

## 2019-05-11 ENCOUNTER — Telehealth: Payer: Self-pay

## 2019-05-11 NOTE — Telephone Encounter (Signed)
lvm for pt to call back and schedule

## 2019-05-11 NOTE — Telephone Encounter (Signed)
Agreed, thanks, please schedule when possible.

## 2019-05-11 NOTE — Telephone Encounter (Signed)
[place],  Can you see if you can get patient set up for a virtual visit with Dr. Damita Dunnings to follow up from the ER.  He was having nausea and vomiting and a "black out' period.  He was told to follow up with PCP in the next 2 days.   Please try and get him scheduled by the end of the week to check on him.  He will need a virtual (if possible) due to the acute N/V. Please let us know if he does not have the capability.   Thanks. I will copy Dr. Damita Dunnings on this as well in case he needs to add anything further.

## 2019-05-13 NOTE — Telephone Encounter (Signed)
Pt called back and scheduled a virtual visit for 05/14/19 @ 11:30am

## 2019-05-14 ENCOUNTER — Ambulatory Visit (INDEPENDENT_AMBULATORY_CARE_PROVIDER_SITE_OTHER): Payer: Medicare Other | Admitting: Family Medicine

## 2019-05-14 ENCOUNTER — Encounter: Payer: Self-pay | Admitting: Family Medicine

## 2019-05-14 DIAGNOSIS — R55 Syncope and collapse: Secondary | ICD-10-CM

## 2019-05-14 NOTE — Progress Notes (Signed)
Interactive audio and video telecommunications were attempted between this provider and patient, however failed, due to patient having technical difficulties OR patient did not have access to video capability.  We continued and completed visit with audio only.   Virtual Visit via Telephone Note  I connected with patient on 05/14/19  at 11:51 AM  by telephone and verified that I am speaking with the correct person using two identifiers.  Location of patient: home.   Location of MD: [facility] Name of referring provider (if blank then none associated): Names per persons and role in encounter:  MD: Earlyne Iba, Patient: name listed above.    I discussed the limitations, risks, security and privacy concerns of performing an evaluation and management service by telephone and the availability of in person appointments. I also discussed with the patient that there may be a patient responsible charge related to this service. The patient expressed understanding and agreed to proceed.  CC: f/u.   History of Present Illness: ER f/u, ER course d/w pt.   He is off metoprolol.  He isn't have palpitations like he did prev.  He isn't using lorazepam.   He was checking on a friend after surgery and was worried about him, that was stressful and that was right before he passed out.  He didn't have SZ activity.    Presumed vasovagal episode.  He had antecedent sx ("with ice water in my veins in both arms").  Prev episode in 2011 with antecedent sx ("felt faint but didn't pass out") during sig exertion w/o syncope.  No syncope otherwise.  No CP, not SOB.  No BLE edema.  No presyncope in the meantime.    He has been resting in the meantime.  He was feeling better until Wednesday until he got his PNA and flu shot at pharmacy.  He had a sweat after that and aches.  He had to rest in the meantime with resolution of sx.  He feels better today.    PNA and flu shot done at [facility] on [address]    Observations/Objective:nad Speech wnl  Assessment and Plan: Vasovagal episode, no ominous findings otherwise.  He had antecedent symptoms.  Mechanism discussed with patient.  Discussed staying well-hydrated and updating me as needed.  He agrees.  Other symptoms are likely related to common immunologic response after vaccination.  He feels better in the meantime.  Update me as needed.   Follow Up Instructions: see above.    I discussed the assessment and treatment plan with the patient. The patient was provided an opportunity to ask questions and all were answered. The patient agreed with the plan and demonstrated an understanding of the instructions.   The patient was advised to call back or seek an in-person evaluation if the symptoms worsen or if the condition fails to improve as anticipated.  I provided 23 minutes of non-face-to-face time during this encounter.  Elsie Stain, MD

## 2019-05-16 DIAGNOSIS — R55 Syncope and collapse: Secondary | ICD-10-CM | POA: Insufficient documentation

## 2019-05-16 NOTE — Assessment & Plan Note (Signed)
Vasovagal episode, no ominous findings otherwise.  He had antecedent symptoms.  Mechanism discussed with patient.  Discussed staying well-hydrated and updating me as needed.  He agrees.  Other symptoms are likely related to common immunologic response after vaccination.  He feels better in the meantime.  Update me as needed.

## 2019-05-25 ENCOUNTER — Other Ambulatory Visit: Payer: Self-pay

## 2019-05-25 MED ORDER — RIVAROXABAN 20 MG PO TABS: ORAL_TABLET | ORAL | 0 refills | Status: DC

## 2019-05-26 ENCOUNTER — Other Ambulatory Visit: Payer: Self-pay

## 2019-05-26 MED ORDER — RIVAROXABAN 20 MG PO TABS: ORAL_TABLET | ORAL | 0 refills | Status: DC

## 2019-09-07 ENCOUNTER — Other Ambulatory Visit: Payer: Self-pay | Admitting: Adult Health

## 2019-11-29 ENCOUNTER — Encounter: Payer: Self-pay | Admitting: Physician Assistant

## 2019-11-30 ENCOUNTER — Other Ambulatory Visit: Payer: Self-pay | Admitting: Adult Health

## 2019-11-30 NOTE — Telephone Encounter (Signed)
Rx request sent to pharmacy.  

## 2019-12-14 ENCOUNTER — Telehealth: Payer: Self-pay

## 2019-12-14 ENCOUNTER — Ambulatory Visit (INDEPENDENT_AMBULATORY_CARE_PROVIDER_SITE_OTHER): Payer: Medicare Other | Admitting: Physician Assistant

## 2019-12-14 ENCOUNTER — Encounter: Payer: Self-pay | Admitting: Physician Assistant

## 2019-12-14 VITALS — BP 110/70 | HR 56 | Temp 97.0°F | Ht 74.0 in | Wt 210.0 lb

## 2019-12-14 DIAGNOSIS — Z1211 Encounter for screening for malignant neoplasm of colon: Secondary | ICD-10-CM

## 2019-12-14 DIAGNOSIS — K219 Gastro-esophageal reflux disease without esophagitis: Secondary | ICD-10-CM | POA: Diagnosis not present

## 2019-12-14 DIAGNOSIS — F411 Generalized anxiety disorder: Secondary | ICD-10-CM | POA: Diagnosis not present

## 2019-12-14 MED ORDER — PANTOPRAZOLE SODIUM 40 MG PO TBEC: 40.0000 mg | DELAYED_RELEASE_TABLET | ORAL | 11 refills | Status: DC

## 2019-12-14 MED ORDER — NA SULFATE-K SULFATE-MG SULF 17.5-3.13-1.6 GM/177ML PO SOLN: ORAL | 0 refills | Status: DC

## 2019-12-14 NOTE — Progress Notes (Addendum)
Subjective:    Patient ID: [name], male    DOB: 16-Feb-1951, 69 y.o.   MRN: [id number]  HPI [name] is a pleasant 69 year old white male, known remotely to Dr. Henrene Pastor on colonoscopy in 2005.  Patient is self-referred today for evaluation of heartburn and indigestion. He has history of prior DVT/PE and has been maintained on Xarelto, coronary artery disease, history of bladder CA, essential tremor, prior TIA, polycythemia and arthritis. Colonoscopy was last done in 2005 which was a normal exam with exception of internal hemorrhoids. Patient has not had prior EGD. He says his current symptoms have been present over the past 1 to 1-1/2 years with frequent heartburn and indigestion.  He says he may have a day without symptoms, then the next day have symptoms all day long.  Symptoms seem to be aggravated by any activities that require bending over, like doing laundry, exercising etc.  He will develop lower chest/subxiphoid burning and indigestion.  He is rarely aware of any sour brash.  Generally no nocturnal symptoms.  He denies any dysphagia or odynophagia. No regular aspirin or NSAIDs, no current EtOH, he drinks decaffeinated coffee. He says he knows he has been eating a lot of fatty greasy and spicy foods over the past year or so which is likely aggravated his symptoms. He has no current lower GI complaints, no complaints of abdominal pain, changes in bowel habits melena or hematochezia.  Review of Systems Pertinent positive and negative review of systems were noted in the above HPI section.  All other review of systems was otherwise negative.  Outpatient Encounter Medications as of 12/14/2019  Medication Sig  . clorazepate (TRANXENE) 7.5 MG tablet Take 1 tablet (7.5 mg total) by mouth at bedtime as needed for sleep.  Marland Kitchen ibuprofen (ADVIL,MOTRIN) 200 MG tablet Take 400 mg by mouth daily as needed for pain.   . Multiple Vitamin (MULTIVITAMIN WITH MINERALS) TABS tablet Take 1 tablet by mouth  daily.  . sertraline (ZOLOFT) 100 MG tablet Take 1 tablet (100 mg total) by mouth daily.  Alveda Reasons 20 MG TABS tablet TAKE ONE TABLET BY MOUTH ONCE DAILY WITH A MEAL  . zolpidem (AMBIEN CR) 12.5 MG CR tablet Take 1 tablet (12.5 mg total) by mouth at bedtime as needed for sleep.  . Na Sulfate-K Sulfate-Mg Sulf 17.5-3.13-1.6 GM/177ML SOLN Suprep-Use as directed  . pantoprazole (PROTONIX) 40 MG tablet Take 1 tablet (40 mg total) by mouth every morning. Take  30 minutes before breakfast   No facility-administered encounter medications on file as of 12/14/2019.   Allergies  Allergen Reactions  . Crestor [Rosuvastatin]     Mood changes  . Escitalopram Oxalate     Mood changes, irritablity  . Latex Other (See Comments)    Blood in urine with latex catheter- but this was likely from mechanical irritation, not from the latex itself.    . Sulfonamide Derivatives Rash   Patient Active Problem List   Diagnosis Date Noted  . Vasovagal syncope 05/16/2019  . Other social stressor 02/27/2019  . FH: CAD (coronary artery disease) 02/27/2019  . Abnormal TSH 02/27/2019  . Polycythemia 01/22/2019  . Thunderclap headache 12/25/2018  . Ocular migraine 12/25/2018  . Rhinorrhea 12/25/2018  . Diarrhea 12/25/2018  . Postphlebitic syndrome 03/15/2018  . Long term (current) use of anticoagulants 03/15/2018  . Varicose veins 03/29/2015  . Back pain 11/15/2014  . CAD - minor - 30-40% LAD, 30% RCA 10/12 06/24/2013  . Essential tremor 01/17/2012  . Pure  hypercholesterolemia 06/29/2011  . Bladder cancer ([place]) 12/24/2010  . TRANSIENT ISCHEMIC ATTACKS, HX OF 05/01/2010  . HEMATURIA, HX OF 05/01/2010  . UNSPECIFIED THROMBOCYTOPENIA 01/08/2010  . HYPERGLYCEMIA 08/30/2008  . SHOULDER PAIN, LEFT 02/08/2008  . DVT (deep venous thrombosis) ([place]) 08/08/2007  . Acute thromboembolism of deep veins of lower extremity ([place]) 08/08/2007  . Alcohol abuse, history of 03/05/2007  . Anxiety state 12/30/2006  . DEPRESSION  12/30/2006   Social History   Socioeconomic History  . Marital status: Single    Spouse name: Not on file  . Number of children: Not on file  . Years of education: Not on file  . Highest education level: Not on file  Occupational History  . Occupation: Environmental manager  Tobacco Use  . Smoking status: Former Smoker    Packs/day: 1.50    Years: 25.00    Pack years: 37.50    Types: Cigarettes    Quit date: 08/26/2004    Years since quitting: 15.3  . Smokeless tobacco: Former Network engineer and Sexual Activity  . Alcohol use: No    Alcohol/week: 0.0 standard drinks    Comment: Quit 2009  . Drug use: Yes    Frequency: 2.0 times per week    Types: Marijuana    Comment: MJ-regular use  10/2013 no longer uses  . Sexual activity: Yes  Other Topics Concern  . 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 Difficulty of Paying Living Expenses:   Food Insecurity:   . Worried About Charity fundraiser in the Last Year:   . Arboriculturist in the Last Year:   Transportation Needs:   . Film/video editor (Medical):   Marland Kitchen Lack of Transportation (Non-Medical):   Physical Activity:   . Days of Exercise per Week:   . Minutes of Exercise per Session:   Stress:   . Feeling of Stress :   Social Connections:   . Frequency of Communication with Friends and Family:   . Frequency of Social Gatherings with Friends and Family:   . Attends Religious Services:   . Active Member of Clubs or Organizations:   . Attends Archivist Meetings:   Marland Kitchen Marital Status:   Intimate Partner Violence:   . Fear of Current or Ex-Partner:   . Emotionally Abused:   Marland Kitchen Physically Abused:   . Sexually Abused:     Mr. Howey family history includes Emphysema in his mother; Heart failure in his father; Other in his father.      Objective:    Vitals:   12/14/19 1049  BP: 110/70    Pulse: (!) 56  Temp: (!) 97 F (36.1 C)    Physical Exam Well-developed well-nourished older white male in no acute distress.  Height, Weight, 210 BMI 26.9  HEENT; nontraumatic normocephalic, EOMI, PER R LA, sclera anicteric. Oropharynx; not examined Neck; supple, no JVD Cardiovascular; regular rate and rhythm with S1-S2, no murmur rub or gallop Pulmonary; Clear bilaterally Abdomen; soft, nontender, nondistended, no palpable mass or hepatosplenomegaly, bowel sounds are active Rectal; not done today Skin; benign exam, no jaundice rash or appreciable lesions Extremities; no clubbing cyanosis or edema skin warm and dry Neuro/Psych; alert and oriented x4, grossly nonfocal mood and affect appropriate      Assessment & Plan:   #45 69 year old white male with  1-1 and half year history of frequent heartburn and indigestion, consistent with GERD Rule out Barrett's  #2 colon cancer screening-last colonoscopy 2005 - except internal hemorrhoids. #3 chronic anticoagulation-on Xarelto #4 remote history of DVT/PE #5 prior history of TIA #6 coronary artery disease 7.  History of bladder cancer 8.  Essential tremor 9.  Polycythemia 10.  Osteoarthritis  Plan; antireflux regimen and diet was reviewed with patient and he was given Scientist, clinical (histocompatibility and immunogenetics). Start Protonix 40 mg 1 p.o. every morning AC breakfast Patient will be scheduled for upper endoscopy and colonoscopy with Dr. Henrene Pastor.  Both procedures were discussed in detail with patient including indications risks and benefits and he is agreeable to proceed. Patient will need to hold Xarelto for at least 24 hours prior to procedures.  We will communicate with his cardiologist/Dr.Croitoru to assure this is reasonable for this pt.  Patient has received COVID-19 vaccination.   Hayze Gazda Genia Harold PA-C 12/14/2019   Cc: Tonia Ghent, MD

## 2019-12-14 NOTE — Telephone Encounter (Signed)
   Primary Cardiologist: Sanda Klein, MD  Chart reviewed as part of pre-operative protocol coverage. Given past medical history and time since last visit, based on ACC/AHA guidelines, [name] would be at acceptable risk for the planned procedure without further cardiovascular testing.   Patient with diagnosis of RECURRENT VTE AND MULTIPLE TIA on XARELTO 20MG  for anticoagulation.    *NON CARDIAC INDICATION*  Procedure: ENDOSCOPY/COLONOSCOPY Date of procedure: 01/12/2020   CrCl = 87ML/MIN  Per office protocol, patient can hold XARELTO for 24 HOURS prior to procedure.   I will route this recommendation to the requesting party via Epic fax function and remove from pre-op pool.  Please call with questions.  [name]. [facility] [address] Office [phone] Fax [phone]

## 2019-12-14 NOTE — Telephone Encounter (Signed)
Patient with diagnosis of RECURRENT VTE AND MULTIPLE TIA on XARELTO 20MG  for anticoagulation.    *NON CARDIAC INDICATION*  Procedure: ENDOSCOPY/COLONOSCOPY Date of procedure: 01/12/2020   CrCl = 87ML/MIN  Per office protocol, patient can hold XARELTO for 24 HOURS prior to procedure.

## 2019-12-14 NOTE — Telephone Encounter (Signed)
Spoke with patient.  He was advised to hold Xarelto 24 hrs prior to procedure. Patient verbalized understanding.

## 2019-12-14 NOTE — Progress Notes (Signed)
Note reviewed. [name], I did not see any mention regarding addressing his chronic anticoagulation.  Thanks

## 2019-12-14 NOTE — Telephone Encounter (Signed)
[facility] Pre-operative Risk Assessment     Request for surgical clearance:     Endoscopy Procedure  What type of surgery is being performed?     Endo/Colon  When is this surgery scheduled?     01/12/20  What type of clearance is required ?   Pharmacy  Are there any medications that need to be held prior to surgery and how long? [place] name and name of physician performing surgery?      [facility]/Dr Henrene Pastor  What is your office phone and fax number?      Phone- [phone]  Fax- [phone] Attn: Peter Congo, RMA Anesthesia type (None, local, MAC, general) ?       [facility]

## 2019-12-14 NOTE — Patient Instructions (Addendum)
If you are age 69 or older, your body mass index should be between 23-30. Your Body mass index is 26.96 kg/m. If this is out of the aforementioned range listed, please consider follow up with your Primary Care Provider.  If you are age 76 or younger, your body mass index should be between 19-25. Your Body mass index is 26.96 kg/m. If this is out of the aformentioned range listed, please consider follow up with your Primary Care Provider.   You have been scheduled for an endoscopy and colonoscopy. Please follow the written instructions given to you at your visit today. Please pick up your prep supplies at the pharmacy within the next 1-3 days. If you use inhalers (even only as needed), please bring them with you on the day of your procedure. Your physician has requested that you go to www.startemmi.com and enter the access code given to you at your visit today. This web site gives a general overview about your procedure. However, you should still follow specific instructions given to you by our office regarding your preparation for the procedure.  We have sent the following medications to your pharmacy for you to pick up at your convenience: Protonix  You have been given a Plenvu sample.  You will be contacted by our office prior to your procedure for directions on holding your Xarelto.  If you do not hear from our office 1 week prior to your scheduled procedure, please call [phone] to discuss.   You have been given GERD literature.  Thank you for choosing me and [facility].   Amy Esterwood, PA-C

## 2019-12-14 NOTE — Telephone Encounter (Signed)
Can you please comment on xarelto?

## 2019-12-20 ENCOUNTER — Telehealth: Payer: Self-pay | Admitting: Family Medicine

## 2019-12-20 NOTE — Telephone Encounter (Signed)
Called and got patient scheduled for CPE and labs. 

## 2019-12-24 ENCOUNTER — Ambulatory Visit (INDEPENDENT_AMBULATORY_CARE_PROVIDER_SITE_OTHER): Payer: Medicare Other

## 2019-12-24 ENCOUNTER — Other Ambulatory Visit: Payer: Self-pay

## 2019-12-24 VITALS — BP 120/70 | Ht 74.0 in | Wt 210.0 lb

## 2019-12-24 DIAGNOSIS — Z0000 Encounter for general adult medical examination without abnormal findings: Secondary | ICD-10-CM | POA: Diagnosis not present

## 2019-12-24 NOTE — Patient Instructions (Signed)
[name] , Thank you for taking time to come for your Medicare Wellness Visit. I appreciate your ongoing commitment to your health goals. Please review the following plan we discussed and let me know if I can assist you in the future.   Screening recommendations/referrals: Colonoscopy: scheduled 01/12/2020  Recommended yearly ophthalmology/optometry visit for glaucoma screening and checkup Recommended yearly dental visit for hygiene and checkup  Vaccinations: Influenza vaccine: Up to date, completed 05/12/2019 Pneumococcal vaccine: Up to date, completed 05/12/2019 Tdap vaccine: decline Shingles vaccine: discussed    Advanced directives: Please bring a copy of your POA (Power of Attorney) and/or Living Will to your next appointment.   Conditions/risks identified: hypercholesterolemia  Next appointment: 01/10/2020 @ 11:30 am   Preventive Care 65 Years and Older, Male Preventive care refers to lifestyle choices and visits with your health care provider that can promote health and wellness. What does preventive care include?  A yearly physical exam. This is also called an annual well check.  Dental exams once or twice a year.  Routine eye exams. Ask your health care provider how often you should have your eyes checked.  Personal lifestyle choices, including:  Daily care of your teeth and gums.  Regular physical activity.  Eating a healthy diet.  Avoiding tobacco and drug use.  Limiting alcohol use.  Practicing safe sex.  Taking low doses of aspirin every day.  Taking vitamin and mineral supplements as recommended by your health care provider. What happens during an annual well check? The services and screenings done by your health care provider during your annual well check will depend on your age, overall health, lifestyle risk factors, and family history of disease. Counseling  Your health care provider may ask you questions about your:  Alcohol use.  Tobacco  use.  Drug use.  Emotional well-being.  Home and relationship well-being.  Sexual activity.  Eating habits.  History of falls.  Memory and ability to understand (cognition).  Work and work Statistician. Screening  You may have the following tests or measurements:  Height, weight, and BMI.  Blood pressure.  Lipid and cholesterol levels. These may be checked every 5 years, or more frequently if you are over 52 years old.  Skin check.  Lung cancer screening. You may have this screening every year starting at age 46 if you have a 30-pack-year history of smoking and currently smoke or have quit within the past 15 years.  Fecal occult blood test (FOBT) of the stool. You may have this test every year starting at age 48.  Flexible sigmoidoscopy or colonoscopy. You may have a sigmoidoscopy every 5 years or a colonoscopy every 10 years starting at age 85.  Prostate cancer screening. Recommendations will vary depending on your family history and other risks.  Hepatitis C blood test.  Hepatitis B blood test.  Sexually transmitted disease (STD) testing.  Diabetes screening. This is done by checking your blood sugar (glucose) after you have not eaten for a while (fasting). You may have this done every 1-3 years.  Abdominal aortic aneurysm (AAA) screening. You may need this if you are a current or former smoker.  Osteoporosis. You may be screened starting at age 33 if you are at high risk. Talk with your health care provider about your test results, treatment options, and if necessary, the need for more tests. Vaccines  Your health care provider may recommend certain vaccines, such as:  Influenza vaccine. This is recommended every year.  Tetanus, diphtheria, and acellular  pertussis (Tdap, Td) vaccine. You may need a Td booster every 10 years.  Zoster vaccine. You may need this after age 60.  Pneumococcal 13-valent conjugate (PCV13) vaccine. One dose is recommended after age  58.  Pneumococcal polysaccharide (PPSV23) vaccine. One dose is recommended after age 57. Talk to your health care provider about which screenings and vaccines you need and how often you need them. This information is not intended to replace advice given to you by your health care provider. Make sure you discuss any questions you have with your health care provider. Document Released: 09/08/2015 Document Revised: 05/01/2016 Document Reviewed: 06/13/2015 Elsevier Interactive Patient Education  2017 [place] Prevention in the Home Falls can cause injuries. They can happen to people of all ages. There are many things you can do to make your home safe and to help prevent falls. What can I do on the outside of my home?  Regularly fix the edges of walkways and driveways and fix any cracks.  Remove anything that might make you trip as you walk through a door, such as a raised step or threshold.  Trim any bushes or trees on the path to your home.  Use bright outdoor lighting.  Clear any walking paths of anything that might make someone trip, such as rocks or tools.  Regularly check to see if handrails are loose or broken. Make sure that both sides of any steps have handrails.  Any raised decks and porches should have guardrails on the edges.  Have any leaves, snow, or ice cleared regularly.  Use sand or salt on walking paths during winter.  Clean up any spills in your garage right away. This includes oil or grease spills. What can I do in the bathroom?  Use night lights.  Install grab bars by the toilet and in the tub and shower. Do not use towel bars as grab bars.  Use non-skid mats or decals in the tub or shower.  If you need to sit down in the shower, use a plastic, non-slip stool.  Keep the floor dry. Clean up any water that spills on the floor as soon as it happens.  Remove soap buildup in the tub or shower regularly.  Attach bath mats securely with double-sided  non-slip rug tape.  Do not have throw rugs and other things on the floor that can make you trip. What can I do in the bedroom?  Use night lights.  Make sure that you have a light by your bed that is easy to reach.  Do not use any sheets or blankets that are too big for your bed. They should not hang down onto the floor.  Have a firm chair that has side arms. You can use this for support while you get dressed.  Do not have throw rugs and other things on the floor that can make you trip. What can I do in the kitchen?  Clean up any spills right away.  Avoid walking on wet floors.  Keep items that you use a lot in easy-to-reach places.  If you need to reach something above you, use a strong step stool that has a grab bar.  Keep electrical cords out of the way.  Do not use floor polish or wax that makes floors slippery. If you must use wax, use non-skid floor wax.  Do not have throw rugs and other things on the floor that can make you trip. What can I do with my stairs?  Do not leave any items on the stairs.  Make sure that there are handrails on both sides of the stairs and use them. Fix handrails that are broken or loose. Make sure that handrails are as long as the stairways.  Check any carpeting to make sure that it is firmly attached to the stairs. Fix any carpet that is loose or worn.  Avoid having throw rugs at the top or bottom of the stairs. If you do have throw rugs, attach them to the floor with carpet tape.  Make sure that you have a light switch at the top of the stairs and the bottom of the stairs. If you do not have them, ask someone to add them for you. What else can I do to help prevent falls?  Wear shoes that:  Do not have high heels.  Have rubber bottoms.  Are comfortable and fit you well.  Are closed at the toe. Do not wear sandals.  If you use a stepladder:  Make sure that it is fully opened. Do not climb a closed stepladder.  Make sure that both  sides of the stepladder are locked into place.  Ask someone to hold it for you, if possible.  Clearly mark and make sure that you can see:  Any grab bars or handrails.  First and last steps.  Where the edge of each step is.  Use tools that help you move around (mobility aids) if they are needed. These include:  Canes.  Walkers.  Scooters.  Crutches.  Turn on the lights when you go into a dark area. Replace any light bulbs as soon as they burn out.  Set up your furniture so you have a clear path. Avoid moving your furniture around.  If any of your floors are uneven, fix them.  If there are any pets around you, be aware of where they are.  Review your medicines with your doctor. Some medicines can make you feel dizzy. This can increase your chance of falling. Ask your doctor what other things that you can do to help prevent falls. This information is not intended to replace advice given to you by your health care provider. Make sure you discuss any questions you have with your health care provider. Document Released: 06/08/2009 Document Revised: 01/18/2016 Document Reviewed: 09/16/2014 Elsevier Interactive Patient Education  2017 [employer].

## 2019-12-24 NOTE — Progress Notes (Signed)
Subjective:   [name] is a 69 y.o. male who presents for an Initial Medicare Annual Wellness Visit.  Review of Systems: N/A    This visit is being conducted through telemedicine via telephone at the nurse health advisor's home address due to the COVID-19 pandemic. This patient has given me verbal consent via doximity to conduct this visit, patient states they are participating from their home address. Patient and myself are on the telephone call. There is no referral for this visit. Some vital signs may be absent or patient reported.    Patient identification: identified by name, DOB, and current address   Cardiac Risk Factors include: advanced age (>60men, >64 women);male gender;Other (see comment), Risk factor comments: hypercholesterolemia    Objective:    Today's Vitals   12/24/19 1108  BP: 120/70  Weight: 210 lb (95.3 kg)  Height: 6\' 2"  (1.88 m)   Body mass index is 26.96 kg/m.  Advanced Directives 12/24/2019 05/08/2019 07/12/2011 07/08/2011  Does Patient Have a Medical Advance Directive? Yes No Patient does not have advance directive Patient does not have advance directive;Patient would not like information  Type of Scientist, forensic Power of [place];Living will - - -  Copy of [place] in Chart? No - copy requested - - -  Would patient like information on creating a medical advance directive? - No - Patient declined - -    Current Medications (verified) Outpatient Encounter Medications as of 12/24/2019  Medication Sig  . clorazepate (TRANXENE) 7.5 MG tablet Take 1 tablet (7.5 mg total) by mouth at bedtime as needed for sleep.  Marland Kitchen ibuprofen (ADVIL,MOTRIN) 200 MG tablet Take 400 mg by mouth daily as needed for pain.   . Multiple Vitamin (MULTIVITAMIN WITH MINERALS) TABS tablet Take 1 tablet by mouth daily.  . Na Sulfate-K Sulfate-Mg Sulf 17.5-3.13-1.6 GM/177ML SOLN Suprep-Use as directed  . pantoprazole (PROTONIX) 40 MG tablet Take 1  tablet (40 mg total) by mouth every morning. Take  30 minutes before breakfast  . sertraline (ZOLOFT) 100 MG tablet Take 1 tablet (100 mg total) by mouth daily.  Alveda Reasons 20 MG TABS tablet TAKE ONE TABLET BY MOUTH ONCE DAILY WITH A MEAL  . zolpidem (AMBIEN CR) 12.5 MG CR tablet Take 1 tablet (12.5 mg total) by mouth at bedtime as needed for sleep.   No facility-administered encounter medications on file as of 12/24/2019.    Allergies (verified) Crestor [rosuvastatin], Escitalopram oxalate, Latex, and Sulfonamide derivatives   History: Past Medical History:  Diagnosis Date  . Alcohol abuse, unspecified    history of  . Anxiety   . Arthritis    all over  . Bladder cancer [facility]) 2011   Dr. Janice Norrie  . CAD (coronary artery disease) 2012   MI  . Depression   . Dyslipidemia   . History of DVT (deep vein thrombosis) 11/08 and again in 2014   previously on coumadin  . Hypertension   . Inguinal hernia without mention of obstruction or gangrene, unilateral or unspecified, (not specified as recurrent)   . Internal hemorrhoids without mention of complication   . Other abnormal glucose   . Personal history of other disorder of urinary system   . Stroke ([place]) 05/25/2005   TIA  . Thrombocytopenia, unspecified ([place])   . TIA (transient ischemic attack) 2008   Past Surgical History:  Procedure Laterality Date  . CARDIAC CATHETERIZATION  05/2011   mild atherosclerosis  . CYSTOSCOPY  07/12/2011   Procedure: CYSTOSCOPY;  Surgeon: Hanley Ben, MD;  Location: [facility];  Service: Urology;  Laterality: N/A;  one hour being requested for this case  . HERNIA REPAIR  8/11   repair of right indirect and direct hernias; incarerated umbilical hernia containing preperitoneal fat  . SHOULDER SURGERY  03/08/08   Left; arthroscopy  . TONSILLECTOMY     as child  . TRANSURETHRAL RESECTION OF BLADDER TUMOR  07/12/2011   Procedure: TRANSURETHRAL RESECTION OF BLADDER TUMOR (TURBT);  Surgeon: Hanley Ben,  MD;  Location: [facility];  Service: Urology;  Laterality: N/A;   Family History  Problem Relation Age of Onset  . Other Father        Hypotension from medication causing GI bleed  . Heart failure Father   . Emphysema Mother    Social History   Socioeconomic History  . Marital status: Single    Spouse name: Not on file  . Number of children: Not on file  . Years of education: Not on file  . Highest education level: Not on file  Occupational History  . Occupation: Environmental manager  Tobacco Use  . Smoking status: Former Smoker    Packs/day: 1.50    Years: 25.00    Pack years: 37.50    Types: Cigarettes    Quit date: 08/26/2004    Years since quitting: 15.3  . Smokeless tobacco: Former Network engineer and Sexual Activity  . Alcohol use: No    Alcohol/week: 0.0 standard drinks    Comment: Quit 2009  . Drug use: Yes    Frequency: 2.0 times per week    Types: Marijuana    Comment: MJ-regular use  10/2013 no longer uses  . Sexual activity: Yes  Other Topics Concern  . Not on file  Social History Narrative   Environmental manager   Single; 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   Social Determinants of Health   Financial Resource Strain: Low Risk   . Difficulty of Paying Living Expenses: Not hard at all  Food Insecurity: No Food Insecurity  . Worried About Charity fundraiser in the Last Year: Never true  . Ran Out of Food in the Last Year: Never true  Transportation Needs: No Transportation Needs  . Lack of Transportation (Medical): No  . Lack of Transportation (Non-Medical): No  Physical Activity: Sufficiently Active  . Days of Exercise per Week: 7 days  . Minutes of Exercise per Session: 60 min  Stress: No Stress Concern Present  . Feeling of Stress : Not at all  Social Connections:   . Frequency of Communication with Friends and Family:   . Frequency of Social Gatherings with Friends and Family:   . Attends Religious Services:   . Active Member of Clubs or Organizations:   . Attends  Archivist Meetings:   Marland Kitchen Marital Status:    Tobacco Counseling Counseling given: Not Answered   Clinical Intake:  Pre-visit preparation completed: Yes  Pain : No/denies pain     Nutritional Risks: None Diabetes: No  How often do you need to have someone help you when you read instructions, pamphlets, or other written materials from your doctor or pharmacy?: 1 - Never What is the last grade level you completed in school?: some college  Interpreter Needed?: No  Information entered by :: CJohnson, LPN  Activities of Daily Living In your present state of health, do you have any difficulty performing the following activities: 12/24/2019  Hearing? Y  Comment ringing in ears  Vision?  N  Difficulty concentrating or making decisions? N  Walking or climbing stairs? N  Dressing or bathing? N  Doing errands, shopping? N  Preparing Food and eating ? N  Using the Toilet? N  In the past six months, have you accidently leaked urine? N  Do you have problems with loss of bowel control? N  Managing your Medications? N  Managing your Finances? N  Housekeeping or managing your Housekeeping? N  Some recent data might be hidden     Immunizations and Health Maintenance Immunization History  Administered Date(s) Administered  . Influenza Split 08/28/2012  . Influenza Whole 05/26/2008  . Influenza, High Dose Seasonal PF 07/29/2018, 05/12/2019  . Influenza, Seasonal, Injecte, Preservative Fre 06/20/2016  . Influenza-Unspecified 07/17/2015, 06/18/2017  . Janssen (J&J) SARS-COV-2 Vaccination 11/29/2019  . Pneumococcal Polysaccharide-23 04/26/2005, 05/12/2019  . Td 08/29/2008   Health Maintenance Due  Topic Date Due  . Hepatitis C Screening  Never done  . COLONOSCOPY  10/11/2013    Patient Care Team: Tonia Ghent, MD as PCP - General Croitoru, Dani Gobble, MD as PCP - Cardiology (Cardiology)  Indicate any recent Medical Services you may have received from other than [facility]  providers in the past year (date may be approximate).    Assessment:   This is a routine wellness examination for [place].  Hearing/Vision screen  Hearing Screening   125Hz  250Hz  500Hz  1000Hz  2000Hz  3000Hz  4000Hz  6000Hz  8000Hz   Right ear:           Left ear:           Vision Screening Comments: Patient gets annual eye exams.  Dietary issues and exercise activities discussed: Current Exercise Habits: Home exercise routine, Type of exercise: strength training/weights, Time (Minutes): 60, Frequency (Times/Week): 7, Weekly Exercise (Minutes/Week): 420, Intensity: Moderate, Exercise limited by: None identified  Goals    . Patient Stated     12/24/2019, I will continue to lift weights everyday for about 1 hour      Depression Screen PHQ 2/9 Scores 12/24/2019  PHQ - 2 Score 0  PHQ- 9 Score 0    Fall Risk Fall Risk  12/24/2019  Falls in the past year? 1  Comment fainted  Number falls in past yr: 0  Injury with Fall? 0  Follow up Falls evaluation completed;Falls prevention discussed    Is the patient's home free of loose throw rugs in walkways, pet beds, electrical cords, etc?   yes      Grab bars in the bathroom? no      Handrails on the stairs?   yes      Adequate lighting?   yes  Timed Get Up and Go performed: N/A  Cognitive Function: MMSE - Mini Mental State Exam 12/24/2019  Orientation to time 5  Orientation to Place 5  Registration 3  Attention/ Calculation 5  Recall 3  Language- repeat 1       Mini Cog  Mini-Cog screen was completed. Maximum score is 22. A value of 0 denotes this part of the MMSE was not completed or the patient failed this part of the Mini-Cog screening.  Screening Tests Health Maintenance  Topic Date Due  . Hepatitis C Screening  Never done  . COLONOSCOPY  10/11/2013  . TETANUS/TDAP  12/23/2024 (Originally 08/29/2018)  . INFLUENZA VACCINE  03/26/2020  . PNA vac Low Risk Adult (2 of 2 - PCV13) 05/11/2020  . COVID-19 Vaccine  Completed     Qualifies for Shingles Vaccine: Yes  Cancer Screenings: Lung: Low Dose CT Chest recommended if Age 31-80 years, 30 pack-year currently smoking OR have quit w/in 15 years. Patient does not qualify. Colorectal: scheduled 01/12/2020  Additional Screenings:  Hepatitis C Screening: due      Plan:   Patient will continue to lift weights everyday for about 1 hour.  I have personally reviewed and noted the following in the patient's chart:   . Medical and social history . Use of alcohol, tobacco or illicit drugs  . Current medications and supplements . Functional ability and status . Nutritional status . Physical activity . Advanced directives . List of other physicians . Hospitalizations, surgeries, and ER visits in previous 12 months . Vitals . Screenings to include cognitive, depression, and falls . Referrals and appointments  In addition, I have reviewed and discussed with patient certain preventive protocols, quality metrics, and best practice recommendations. A written personalized care plan for preventive services as well as general preventive health recommendations were provided to patient.     Andrez Grime, LPN   [id number]

## 2019-12-24 NOTE — Progress Notes (Signed)
PCP notes:  Health Maintenance: colonscopy- scheduled 01/12/2020 Tdap- insurance/financial   Abnormal Screenings: none   Patient concerns: none   Nurse concerns: none   Next PCP appt.: 01/10/2020 @ 11:30 am

## 2020-01-03 ENCOUNTER — Other Ambulatory Visit (INDEPENDENT_AMBULATORY_CARE_PROVIDER_SITE_OTHER): Payer: Medicare Other

## 2020-01-03 ENCOUNTER — Other Ambulatory Visit: Payer: Self-pay

## 2020-01-03 ENCOUNTER — Other Ambulatory Visit: Payer: Self-pay | Admitting: Family Medicine

## 2020-01-03 ENCOUNTER — Encounter: Payer: Self-pay | Admitting: Internal Medicine

## 2020-01-03 DIAGNOSIS — Z8249 Family history of ischemic heart disease and other diseases of the circulatory system: Secondary | ICD-10-CM

## 2020-01-03 DIAGNOSIS — R7989 Other specified abnormal findings of blood chemistry: Secondary | ICD-10-CM | POA: Diagnosis not present

## 2020-01-03 LAB — COMPREHENSIVE METABOLIC PANEL
ALT: 27 U/L (ref 0–53)
AST: 28 U/L (ref 0–37)
Albumin: 4 g/dL (ref 3.5–5.2)
Alkaline Phosphatase: 40 U/L (ref 39–117)
BUN: 20 mg/dL (ref 6–23)
CO2: 26 mEq/L (ref 19–32)
Calcium: 9.1 mg/dL (ref 8.4–10.5)
Chloride: 104 mEq/L (ref 96–112)
Creatinine, Ser: 0.84 mg/dL (ref 0.40–1.50)
GFR: 90.69 mL/min (ref >60.00)
Glucose, Bld: 114 mg/dL — ABNORMAL HIGH (ref 70–99)
Potassium: 4.1 mEq/L (ref 3.5–5.1)
Sodium: 136 mEq/L (ref 135–145)
Total Bilirubin: 0.8 mg/dL (ref 0.2–1.2)
Total Protein: 7.5 g/dL (ref 6.0–8.3)

## 2020-01-03 LAB — CBC WITH DIFFERENTIAL/PLATELET
Basophils Absolute: 0 10*3/uL (ref 0.0–0.1)
Basophils Relative: 0.9 % (ref 0.0–3.0)
Eosinophils Absolute: 0.1 10*3/uL (ref 0.0–0.7)
Eosinophils Relative: 2.3 % (ref 0.0–5.0)
HCT: 47.9 % (ref 39.0–52.0)
Hemoglobin: 16.5 g/dL (ref 13.0–17.0)
Lymphocytes Relative: 32.6 % (ref 12.0–46.0)
Lymphs Abs: 1.3 10*3/uL (ref 0.7–4.0)
MCHC: 34.5 g/dL (ref 30.0–36.0)
MCV: 96.8 fl (ref 78.0–100.0)
Monocytes Absolute: 0.5 10*3/uL (ref 0.1–1.0)
Monocytes Relative: 12.4 % — ABNORMAL HIGH (ref 3.0–12.0)
Neutro Abs: 2.1 10*3/uL (ref 1.4–7.7)
Neutrophils Relative %: 51.8 % (ref 43.0–77.0)
Platelets: 145 10*3/uL — ABNORMAL LOW (ref 150.0–400.0)
RBC: 4.95 Mil/uL (ref 4.22–5.81)
RDW: 13.4 % (ref 11.5–15.5)
WBC: 4 10*3/uL (ref 4.0–10.5)

## 2020-01-03 LAB — LIPID PANEL
Cholesterol: 174 mg/dL (ref 0–200)
HDL: 39.9 mg/dL (ref >39.00)
LDL Cholesterol: 116 mg/dL — ABNORMAL HIGH (ref 0–99)
NonHDL: 134.34
Total CHOL/HDL Ratio: 4
Triglycerides: 93 mg/dL (ref 0.0–149.0)
VLDL: 18.6 mg/dL (ref 0.0–40.0)

## 2020-01-03 LAB — TSH: TSH: 7.17 u[IU]/mL — ABNORMAL HIGH (ref 0.35–4.50)

## 2020-01-10 ENCOUNTER — Encounter: Payer: Medicare Other | Admitting: Family Medicine

## 2020-01-12 ENCOUNTER — Encounter: Payer: Medicare Other | Admitting: Internal Medicine

## 2020-01-12 ENCOUNTER — Telehealth: Payer: Self-pay | Admitting: Internal Medicine

## 2020-01-12 NOTE — Telephone Encounter (Signed)
Noted.  He should reschedule at his convenience.  Nursing will need to review all his instructions again if he decides to follow-up

## 2020-01-12 NOTE — Telephone Encounter (Signed)
Dr. Henrene Pastor, this patient will not make his EGD/COL scheduled at 2:30 pm today.  He stated that he has been sick the past five days and forgot about the appointment and does not have transportation.

## 2020-01-13 ENCOUNTER — Other Ambulatory Visit: Payer: Self-pay

## 2020-01-13 ENCOUNTER — Telehealth: Payer: Self-pay | Admitting: Family Medicine

## 2020-01-13 ENCOUNTER — Ambulatory Visit (HOSPITAL_COMMUNITY)
Admission: EM | Admit: 2020-01-13 | Discharge: 2020-01-13 | Disposition: A | Discharge status: Home / Self Care | Payer: Medicare Other | Attending: Family Medicine | Admitting: Family Medicine

## 2020-01-13 ENCOUNTER — Encounter (HOSPITAL_COMMUNITY): Payer: Self-pay

## 2020-01-13 ENCOUNTER — Ambulatory Visit (INDEPENDENT_AMBULATORY_CARE_PROVIDER_SITE_OTHER): Payer: Medicare Other

## 2020-01-13 DIAGNOSIS — Z8551 Personal history of malignant neoplasm of bladder: Secondary | ICD-10-CM | POA: Diagnosis not present

## 2020-01-13 DIAGNOSIS — R062 Wheezing: Secondary | ICD-10-CM

## 2020-01-13 DIAGNOSIS — F329 Major depressive disorder, single episode, unspecified: Secondary | ICD-10-CM | POA: Diagnosis not present

## 2020-01-13 DIAGNOSIS — Z79899 Other long term (current) drug therapy: Secondary | ICD-10-CM | POA: Insufficient documentation

## 2020-01-13 DIAGNOSIS — Z8249 Family history of ischemic heart disease and other diseases of the circulatory system: Secondary | ICD-10-CM | POA: Diagnosis not present

## 2020-01-13 DIAGNOSIS — Z791 Long term (current) use of non-steroidal anti-inflammatories (NSAID): Secondary | ICD-10-CM | POA: Insufficient documentation

## 2020-01-13 DIAGNOSIS — R05 Cough: Secondary | ICD-10-CM

## 2020-01-13 DIAGNOSIS — Z7901 Long term (current) use of anticoagulants: Secondary | ICD-10-CM | POA: Insufficient documentation

## 2020-01-13 DIAGNOSIS — M1990 Unspecified osteoarthritis, unspecified site: Secondary | ICD-10-CM | POA: Diagnosis not present

## 2020-01-13 DIAGNOSIS — J181 Lobar pneumonia, unspecified organism: Secondary | ICD-10-CM | POA: Diagnosis not present

## 2020-01-13 DIAGNOSIS — F419 Anxiety disorder, unspecified: Secondary | ICD-10-CM | POA: Diagnosis not present

## 2020-01-13 DIAGNOSIS — R0602 Shortness of breath: Secondary | ICD-10-CM

## 2020-01-13 DIAGNOSIS — J189 Pneumonia, unspecified organism: Secondary | ICD-10-CM

## 2020-01-13 DIAGNOSIS — Z87891 Personal history of nicotine dependence: Secondary | ICD-10-CM | POA: Diagnosis not present

## 2020-01-13 DIAGNOSIS — Z882 Allergy status to sulfonamides status: Secondary | ICD-10-CM | POA: Diagnosis not present

## 2020-01-13 DIAGNOSIS — Z86718 Personal history of other venous thrombosis and embolism: Secondary | ICD-10-CM | POA: Diagnosis not present

## 2020-01-13 DIAGNOSIS — I2510 Atherosclerotic heart disease of native coronary artery without angina pectoris: Secondary | ICD-10-CM | POA: Insufficient documentation

## 2020-01-13 DIAGNOSIS — Z20822 Contact with and (suspected) exposure to covid-19: Secondary | ICD-10-CM | POA: Insufficient documentation

## 2020-01-13 DIAGNOSIS — E785 Hyperlipidemia, unspecified: Secondary | ICD-10-CM | POA: Insufficient documentation

## 2020-01-13 DIAGNOSIS — Z8719 Personal history of other diseases of the digestive system: Secondary | ICD-10-CM | POA: Diagnosis not present

## 2020-01-13 DIAGNOSIS — Z8349 Family history of other endocrine, nutritional and metabolic diseases: Secondary | ICD-10-CM | POA: Diagnosis not present

## 2020-01-13 DIAGNOSIS — I10 Essential (primary) hypertension: Secondary | ICD-10-CM | POA: Insufficient documentation

## 2020-01-13 DIAGNOSIS — Z8673 Personal history of transient ischemic attack (TIA), and cerebral infarction without residual deficits: Secondary | ICD-10-CM | POA: Insufficient documentation

## 2020-01-13 MED ORDER — LEVOFLOXACIN 500 MG PO TABS: 500.0000 mg | ORAL_TABLET | Freq: Every day | ORAL | 0 refills | Status: DC

## 2020-01-13 MED ORDER — AMOXICILLIN-POT CLAVULANATE 875-125 MG PO TABS: 1.0000 | ORAL_TABLET | Freq: Two times a day (BID) | ORAL | 0 refills | Status: DC

## 2020-01-13 MED ORDER — OXYCODONE-ACETAMINOPHEN 5-325 MG PO TABS: 2.0000 | ORAL_TABLET | ORAL | 0 refills | Status: DC | PRN

## 2020-01-13 NOTE — Telephone Encounter (Signed)
Message left for patient to return my call.  

## 2020-01-13 NOTE — Telephone Encounter (Signed)
Attempted to call patient, mailbox full  Will call back

## 2020-01-13 NOTE — Telephone Encounter (Signed)
Pt is at Urgent Care being seen and getting Covid tested.   Aware of recommendations per Webb Silversmith  Will send to Dr Tennis Must to follow up with patient.   Also, pt has appt scheduled for 01/20/20 for CPE with Dr Damita Dunnings -- awaiting Dr Damita Dunnings and COvid results before cancelling.

## 2020-01-13 NOTE — ED Triage Notes (Signed)
Pt presents with non productive cough, congestion, nasal drainage, chills, and headache since Friday.

## 2020-01-13 NOTE — ED Provider Notes (Signed)
[place]    CSN: [id number] Arrival date & time: 01/13/20  1610      History   Chief Complaint Chief Complaint  Patient presents with  . Cough  . Headache  . Chills    HPI [name] is a 69 y.o. male.   HPI  Patient states he had his Covid shot in April.  He had a single shot [employer]. He states currently he has been sick with a cough, congestion, nasal drainage, fever and chills.  He spent the last couple days in bed.  He states he has a "sledgehammer headache".  He has lost a sense of smell and taste.  He is coughing but not particularly short of breath.  He states that his lungs feel "raw".  He did smoke until 2006 when he had a TIA.  He does not think he has any underlying asthma or COPD. He also mentions that when he takes a deep breath he has pain underneath his left pectoralis region He states that he has been having sweats and chills.  He is spent the last couple of days in bed Patient does have a history of DVT and takes chronic Xarelto He also has hyperlipidemia.  Coronary artery disease. He has not been exposed to anybody with coronavirus He has been good about wearing a mask and social distancing   Past Medical History:  Diagnosis Date  . Alcohol abuse, unspecified    history of  . Anxiety   . Arthritis    all over  . Bladder cancer [facility]) 2011   Dr. Janice Norrie  . CAD (coronary artery disease) 2012   MI  . Depression   . Dyslipidemia   . History of DVT (deep vein thrombosis) 11/08 and again in 2014   previously on coumadin  . Hypertension   . Inguinal hernia without mention of obstruction or gangrene, unilateral or unspecified, (not specified as recurrent)   . Internal hemorrhoids without mention of complication   . Other abnormal glucose   . Personal history of other disorder of urinary system   . Stroke ([place]) 05/25/2005   TIA  . Thrombocytopenia, unspecified ([place])   . TIA (transient ischemic attack) 2008    Patient Active  Problem List   Diagnosis Date Noted  . Vasovagal syncope 05/16/2019  . Other social stressor 02/27/2019  . FH: CAD (coronary artery disease) 02/27/2019  . Abnormal TSH 02/27/2019  . Polycythemia 01/22/2019  . [place] headache 12/25/2018  . Ocular migraine 12/25/2018  . Rhinorrhea 12/25/2018  . Diarrhea 12/25/2018  . Postphlebitic syndrome 03/15/2018  . Long term (current) use of anticoagulants 03/15/2018  . Varicose veins 03/29/2015  . Back pain 11/15/2014  . CAD - minor - 30-40% LAD, 30% RCA 10/12 06/24/2013  . Essential tremor 01/17/2012  . Pure hypercholesterolemia 06/29/2011  . Bladder cancer ([place]) 12/24/2010  . TRANSIENT ISCHEMIC ATTACKS, HX OF 05/01/2010  . HEMATURIA, HX OF 05/01/2010  . UNSPECIFIED THROMBOCYTOPENIA 01/08/2010  . HYPERGLYCEMIA 08/30/2008  . SHOULDER PAIN, LEFT 02/08/2008  . DVT (deep venous thrombosis) ([place]) 08/08/2007  . Acute thromboembolism of deep veins of lower extremity ([place]) 08/08/2007  . Alcohol abuse, history of 03/05/2007  . Anxiety state 12/30/2006  . DEPRESSION 12/30/2006    Past Surgical History:  Procedure Laterality Date  . CARDIAC CATHETERIZATION  05/2011   mild atherosclerosis  . CYSTOSCOPY  07/12/2011   Procedure: CYSTOSCOPY;  Surgeon: Hanley Ben, MD;  [facility];  Service: Urology;  Laterality: N/A;  one hour being requested for this case  . HERNIA REPAIR  8/11   repair of right indirect and direct hernias; incarerated umbilical hernia containing preperitoneal fat  . SHOULDER SURGERY  03/08/08   Left; arthroscopy  . TONSILLECTOMY     as child  . TRANSURETHRAL RESECTION OF BLADDER TUMOR  07/12/2011   Procedure: TRANSURETHRAL RESECTION OF BLADDER TUMOR (TURBT);  Surgeon: Hanley Ben, MD;  [facility];  Service: Urology;  Laterality: N/A;       Home Medications    Prior to Admission medications   Medication Sig Start Date End Date Taking? Authorizing Provider  amoxicillin-clavulanate (AUGMENTIN)  875-125 MG tablet Take 1 tablet by mouth every 12 (twelve) hours. 01/13/20   Raylene Everts, MD  clorazepate (TRANXENE) 7.5 MG tablet Take 1 tablet (7.5 mg total) by mouth at bedtime as needed for sleep. 12/24/18   Tonia Ghent, MD  ibuprofen (ADVIL,MOTRIN) 200 MG tablet Take 400 mg by mouth daily as needed for pain.     [provider]  levofloxacin (LEVAQUIN) 500 MG tablet Take 1 tablet (500 mg total) by mouth daily. 01/13/20   Raylene Everts, MD  Multiple Vitamin (MULTIVITAMIN WITH MINERALS) TABS tablet Take 1 tablet by mouth daily.    [provider]  Na Sulfate-K Sulfate-Mg Sulf 17.5-3.13-1.6 GM/177ML SOLN Suprep-Use as directed 12/14/19   Esterwood, Amy S, PA-C  oxyCODONE-acetaminophen (PERCOCET/ROXICET) 5-325 MG tablet Take 2 tablets by mouth every 4 (four) hours as needed for severe pain. 01/13/20   Raylene Everts, MD  pantoprazole (PROTONIX) 40 MG tablet Take 1 tablet (40 mg total) by mouth every morning. Take  30 minutes before breakfast 12/14/19   Esterwood, Amy S, PA-C  sertraline (ZOLOFT) 100 MG tablet Take 1 tablet (100 mg total) by mouth daily. 12/24/18   Tonia Ghent, MD  XARELTO 20 MG TABS tablet TAKE ONE TABLET BY MOUTH ONCE DAILY WITH A MEAL 11/30/19   Croitoru, Dani Gobble, MD  zolpidem (AMBIEN CR) 12.5 MG CR tablet Take 1 tablet (12.5 mg total) by mouth at bedtime as needed for sleep. 12/24/18   Tonia Ghent, MD    Family History Family History  Problem Relation Age of Onset  . Other Father        Hypotension from medication causing GI bleed  . Heart failure Father   . Emphysema Mother     Social History Social History   Tobacco Use  . Smoking status: Former Smoker    Packs/day: 1.50    Years: 25.00    Pack years: 37.50    Types: Cigarettes    Quit date: 08/26/2004    Years since quitting: 15.3  . Smokeless tobacco: Former Network engineer Use Topics  . Alcohol use: No    Alcohol/week: 0.0 standard drinks    Comment: Quit 2009  . Drug  use: Yes    Frequency: 2.0 times per week    Types: Marijuana    Comment: MJ-regular use  10/2013 no longer uses     Allergies   Crestor [rosuvastatin], Escitalopram oxalate, Latex, and Sulfonamide derivatives   Review of Systems Review of Systems  Constitutional: Positive for appetite change, chills, fatigue and fever.  HENT: Positive for congestion, postnasal drip and rhinorrhea.   Respiratory: Positive for cough and shortness of breath.   Cardiovascular: Positive for chest pain.  Gastrointestinal: Negative for nausea and vomiting.  Musculoskeletal: Positive for myalgias.  Neurological: Positive for headaches.     Physical Exam  Triage Vital Signs ED Triage Vitals  Enc Vitals Group     BP 01/13/20 1712 121/65     Pulse Rate 01/13/20 1712 (!) 59     Resp 01/13/20 1710 18     Temp 01/13/20 1710 98.8 F (37.1 C)     Temp Source 01/13/20 1710 Oral     SpO2 01/13/20 1712 95 %     Weight --      Height --      Head Circumference --      Peak Flow --      Pain Score 01/13/20 1712 6     Pain Loc --      Pain Edu? --      Excl. in [place]? --    No data found.  Updated Vital Signs BP 121/65   Pulse (!) 59   Temp 98.8 F (37.1 C) (Oral)   Resp 18   SpO2 95%     Physical Exam Constitutional:      General: He is not in acute distress.    Appearance: He is well-developed and normal weight.     Comments: Appears moderately ill, tired  HENT:     Head: Normocephalic and atraumatic.     Right Ear: Tympanic membrane and ear canal normal.     Left Ear: Tympanic membrane and ear canal normal.     Nose: Nose normal.     Mouth/Throat:     Mouth: Mucous membranes are moist.     Pharynx: No posterior oropharyngeal erythema.  Eyes:     Conjunctiva/sclera: Conjunctivae normal.     Pupils: Pupils are equal, round, and reactive to light.  Cardiovascular:     Rate and Rhythm: Regular rhythm. Bradycardia present.     Heart sounds: Normal heart sounds.  Pulmonary:     Effort:  Pulmonary effort is normal. No respiratory distress.     Breath sounds: Normal breath sounds.  Musculoskeletal:        General: Normal range of motion.     Cervical back: Normal range of motion.  Lymphadenopathy:     Cervical: No cervical adenopathy.  Skin:    General: Skin is warm and dry.  Neurological:     Mental Status: He is alert.  Psychiatric:        Mood and Affect: Mood normal.        Behavior: Behavior normal.      UC Treatments / Results  Labs (all labs ordered are listed, but only abnormal results are displayed) Labs Reviewed  SARS CORONAVIRUS 2 (TAT 6-24 HRS)    EKG   Radiology DG Chest 2 View  Result Date: 01/13/2020 CLINICAL DATA:  Shortness of breath.  Cough.  Wheezing.  Ex-smoker. EXAM: CHEST - 2 VIEW COMPARISON:  07/08/2011 FINDINGS: Midline trachea. Normal heart size. Atherosclerosis in the transverse aorta. No pleural effusion or pneumothorax. Increased density projecting along the left heart border on the frontal radiograph, not well localized on the lateral. Mild lower lung predominant interstitial thickening. IMPRESSION: Opacity in the inferior left hemithorax, not well localized on the lateral view. Given its development since the prior radiograph of 2012, suspicious for lingular airspace disease/pneumonia. Followup PA and lateral chest X-ray is recommended in 3-4 weeks following trial of antibiotic therapy to ensure resolution and exclude underlying malignancy. Peribronchial thickening which may relate to chronic bronchitis or smoking. Aortic Atherosclerosis (ICD10-I70.0). These results will be called to the ordering clinician or representative by the Radiologist Assistant, and communication documented in  the PACS or [employer]. Electronically Signed   By: Abigail Miyamoto M.D.   On: 01/13/2020 18:20    Procedures Procedures (including critical care time)  Medications Ordered in UC Medications - No data to display  Initial Impression / Assessment and  Plan / UC Course  I have reviewed the triage vital signs and the nursing notes.  Pertinent labs & imaging results that were available during my care of the patient were reviewed by me and considered in my medical decision making (see chart for details).      Final Clinical Impressions(s) / UC Diagnoses   Final diagnoses:  Community acquired pneumonia of left lower lobe of lung     Discharge Instructions     Push fluids Get plenty of rest Take oxycodone as needed severe headache.  Take with food Take the antibiotics as instructed Get an probiotic to take with them so they do not upset your GI tract See your primary in follow up Need a repeat chest x ray in 4 weeks    ED Prescriptions    Medication Sig Dispense Auth. Provider   oxyCODONE-acetaminophen (PERCOCET/ROXICET) 5-325 MG tablet Take 2 tablets by mouth every 4 (four) hours as needed for severe pain. 15 tablet Raylene Everts, MD   amoxicillin-clavulanate (AUGMENTIN) 875-125 MG tablet Take 1 tablet by mouth every 12 (twelve) hours. 14 tablet Raylene Everts, MD   levofloxacin (LEVAQUIN) 500 MG tablet Take 1 tablet (500 mg total) by mouth daily. 7 tablet Raylene Everts, MD     I have reviewed the PDMP during this encounter.   Raylene Everts, MD 01/13/20 2056

## 2020-01-13 NOTE — Discharge Instructions (Addendum)
Push fluids Get plenty of rest Take oxycodone as needed severe headache.  Take with food Take the antibiotics as instructed Get an probiotic to take with them so they do not upset your GI tract See your primary in follow up Need a repeat chest x ray in 4 weeks

## 2020-01-13 NOTE — Telephone Encounter (Signed)
He can try Mucinex 600 mg BID, Flonase, Tylenol/Ibuprofen and Delsym for cough. Would recommend he be seen at UC/ER if he feels his symptoms are severe.

## 2020-01-13 NOTE — Telephone Encounter (Signed)
Please get update on patient tomorrow.  Thanks.

## 2020-01-13 NOTE — Telephone Encounter (Signed)
Pt called and is requesting recommendations for his "covid-like" symptoms.  Pt c/o fever (100), dry cough (non-productive), chills, body aches, no taste, terrible headache, runny nose and congestion x 3 days. Pt has been using Afrin for sinus symptoms. Pt denied SOB but sounded very short on the phone during our conversation - might have been because his nose is very congested.  Pt feels that he needs a Covid test and is asking for recommendations.I did advise that he does need to be covid tested.  I gave him the number to contact regarding covid test scheduling - advised him that if unable to get anything appt wise from them to call our office back .   He is also scheduled on 01/20/20 with Dr Damita Dunnings for CPE -- aware that this appt will likely have to be rescheduled.   In the meantime will send message to Dr Duncan/Dr Einar Pheasant for recommendations.    Allergies  Allergen Reactions  . Crestor [Rosuvastatin]     Mood changes  . Escitalopram Oxalate     Mood changes, irritablity  . Latex Other (See Comments)    Blood in urine with latex catheter- but this was likely from mechanical irritation, not from the latex itself.    . Sulfonamide Derivatives Rash

## 2020-01-13 NOTE — Telephone Encounter (Signed)
Dr Einar Pheasant is not in office this afternoon, will route to [facility] to address.

## 2020-01-14 LAB — SARS CORONAVIRUS 2 (TAT 6-24 HRS): SARS Coronavirus 2: NEGATIVE

## 2020-01-14 NOTE — Telephone Encounter (Signed)
Left detailed message on voicemail to return call for update.

## 2020-01-17 NOTE — Telephone Encounter (Signed)
Pt called back and reported that he has been diagnosed with pneumonia which is why he missed his EGD/COL.  Pt inquired whether he will be charged for no show.

## 2020-01-19 NOTE — Telephone Encounter (Signed)
Pt tested negative for Covid Does have PNA - newly diagnosed 01/13/20  Having chills/sweats, no fever, little cough - more productive now. No taste/smell Pt is taking two antibiotics for the PNA  Pt is scheduled with Dr Damita Dunnings 01/20/20 at 10:45am -- able to keep in office or does he need to be flipped to virtual?

## 2020-01-19 NOTE — Telephone Encounter (Signed)
I think it would be best for the pt to do a virtual visit but if he is not able to do a video it would have to be a phone virtual visit. These are fairly new symptoms for the pt and even thought he tested negative for covid he is still presenting some symptom.  Please schedule the pt for a virtual visit if at all possible.

## 2020-01-19 NOTE — Telephone Encounter (Signed)
Left message for a call back   [name], what do you think? In office vs Virtual

## 2020-01-19 NOTE — Telephone Encounter (Signed)
He tested negative for Covid and has pneumonia documented on the chest x-ray, currently on antibiotics.  I am okay seeing him in the office assuming we can still accomplish this given the current restrictions.  I think it should be switched to a virtual if he is capable of that, if he prefers that, or if the current in-person restrictions prevent him coming to the clinic.  Thanks.

## 2020-01-20 ENCOUNTER — Telehealth (INDEPENDENT_AMBULATORY_CARE_PROVIDER_SITE_OTHER): Payer: Medicare Other | Admitting: Family Medicine

## 2020-01-20 ENCOUNTER — Encounter: Payer: Self-pay | Admitting: Family Medicine

## 2020-01-20 VITALS — Ht 74.0 in | Wt 194.0 lb

## 2020-01-20 DIAGNOSIS — F411 Generalized anxiety disorder: Secondary | ICD-10-CM

## 2020-01-20 DIAGNOSIS — E039 Hypothyroidism, unspecified: Secondary | ICD-10-CM

## 2020-01-20 DIAGNOSIS — R7309 Other abnormal glucose: Secondary | ICD-10-CM

## 2020-01-20 DIAGNOSIS — D696 Thrombocytopenia, unspecified: Secondary | ICD-10-CM | POA: Diagnosis not present

## 2020-01-20 DIAGNOSIS — Z8249 Family history of ischemic heart disease and other diseases of the circulatory system: Secondary | ICD-10-CM

## 2020-01-20 DIAGNOSIS — Z0000 Encounter for general adult medical examination without abnormal findings: Secondary | ICD-10-CM

## 2020-01-20 DIAGNOSIS — J189 Pneumonia, unspecified organism: Secondary | ICD-10-CM | POA: Diagnosis not present

## 2020-01-20 DIAGNOSIS — Z8551 Personal history of malignant neoplasm of bladder: Secondary | ICD-10-CM

## 2020-01-20 DIAGNOSIS — Z7189 Other specified counseling: Secondary | ICD-10-CM

## 2020-01-20 NOTE — Telephone Encounter (Signed)
Pt seen today 01/20/20 virtually  Nothing further needed.

## 2020-01-20 NOTE — Progress Notes (Signed)
Duplicate note opened in error.  See above.

## 2020-01-20 NOTE — Progress Notes (Signed)
Virtual visit completed through WebEx or similar program Patient location: home  Provider location: [place] at [facility], office  Participants: Patient and me (unless stated otherwise below)  Pandemic considerations d/w pt.   Limitations and rationale for visit method d/w patient.  Patient agreed to proceed.   CC: CPE.    HPI:  Tetanus 2010 Flu 2020 PNA 2020 Shingles d/w pt.   covid 2021 Prostate cancer screening deferred at this point.  D/w pt about asking for urology input.   Colon CA screening. D/w pt about f/u when possible.   Living will d/w pt.  Denyse Amass designated if patient were incapacitated.   Diet and exercise d/w pt.  He has intentional weight loss with diet changes.  He is still exercising.    Mildly inc TSH.  D/w pt about tx vs observation.  No neck mass.  We talked about starting 25 mcg of levothyroxine after we get his follow-up chest x-ray done.  Mildly low PLT w/o bleeding.  Labs discussed with patient.  No intervention needed at this point.  Mildly inc sugar, d/w pt about diet and exercise.    H/o bladder cancer.  He is going to see urology when possible.  No blood in urine.    D/w pt about f/u with cardiology when possible.  Statin intolerant.    H/o DVT.  Still on xarelto, no bleeding. Compliant.  Plan for long term anticoagulation, he agrees.  He is using tranxene less recently, he is putting up with his situation as is.  Mood d/w pt.  Still on sertraline with relief.  Compliant.    He is putting up with hand pain as is.  He had surgery on his R hand.    CAP.  He clearly feels better in the meantime.  He isn't back to baseline but is improving.  Still with some fatigue.  Hot and cold cycles are resolved. Cough is better.  Still on abx currently.  Not SOB.  We talked about getting follow-up chest x-ray in 1 month.  PMH and SH reviewed  Meds, vitals, and allergies reviewed.   ROS: Per HPI.  Unless specifically indicated otherwise in HPI, the  patient denies:  General: fever. Eyes: acute vision changes ENT: sore throat Cardiovascular: chest pain Respiratory: SOB GI: vomiting GU: dysuria Musculoskeletal: acute back pain Derm: acute rash Neuro: acute motor dysfunction Psych: worsening mood Endocrine: polydipsia Heme: bleeding Allergy: hayfever  Meds and allergies reviewed.   ROS: Per HPI unless specifically indicated in ROS section   NAD Speech wnl  A/P:  Tetanus 2010 Flu 2020 PNA 2020 Shingles d/w pt.   covid 2021 Prostate cancer screening deferred at this point.  D/w pt about asking for urology input.   Colon CA screening. D/w pt about f/u when possible.   Living will d/w pt.  Denyse Amass designated if patient were incapacitated.   Diet and exercise d/w pt.  He has intentional weight loss with diet changes.  He is still exercising.    Mildly inc TSH.  D/w pt about tx vs observation.  No neck mass.  We talked about starting 25 mcg of levothyroxine after we get his follow-up chest x-ray done.  Mildly low PLT w/o bleeding.  Labs discussed with patient.  No intervention needed at this point.  Mildly inc sugar, d/w pt about diet and exercise.    H/o bladder cancer.  He is going to see urology when possible.  No blood in urine.    D/w  pt about f/u with cardiology when possible.  Statin intolerant.    H/o DVT.  Still on xarelto, no bleeding. Compliant.  Plan for long term anticoagulation, he agrees.  He is using tranxene less recently, he is putting up with his situation as is.  Mood d/w pt.  Still on sertraline with relief.  Compliant.    He is putting up with hand pain as is.  He had surgery on his R hand.  I will defer.  He agrees.  CAP.  He clearly feels better in the meantime.  He isn't back to baseline but is improving.  Still with some fatigue.  Hot and cold cycles are resolved. Cough is better.  Still on abx currently.  Not SOB.  We talked about getting follow-up chest x-ray in 1 month.  Continue as is  for now.  He will update me if worse in the meantime.  He agrees.

## 2020-01-23 NOTE — Telephone Encounter (Signed)
No charge. Patient should be rescheduled for his procedures when fully recovered. Thanks

## 2020-01-24 DIAGNOSIS — Z0000 Encounter for general adult medical examination without abnormal findings: Secondary | ICD-10-CM | POA: Insufficient documentation

## 2020-01-24 DIAGNOSIS — Z7189 Other specified counseling: Secondary | ICD-10-CM | POA: Insufficient documentation

## 2020-01-24 DIAGNOSIS — J189 Pneumonia, unspecified organism: Secondary | ICD-10-CM | POA: Insufficient documentation

## 2020-01-24 DIAGNOSIS — E039 Hypothyroidism, unspecified: Secondary | ICD-10-CM | POA: Insufficient documentation

## 2020-01-24 MED ORDER — LEVOTHYROXINE SODIUM 25 MCG PO TABS: 25.0000 ug | ORAL_TABLET | Freq: Every day | ORAL | 3 refills | Status: DC

## 2020-01-24 NOTE — Assessment & Plan Note (Signed)
We talked about starting 25 mcg of levothyroxine after we get his follow-up chest x-ray done.  See follow-up phone note.

## 2020-01-24 NOTE — Assessment & Plan Note (Signed)
He is going to see urology when possible.  No blood in urine.

## 2020-01-24 NOTE — Assessment & Plan Note (Signed)
H/o DVT.  Still on xarelto, no bleeding. Compliant.  Plan for long term anticoagulation, he agrees.

## 2020-01-24 NOTE — Assessment & Plan Note (Signed)
Mildly low PLT w/o bleeding.  Labs discussed with patient.  No intervention needed at this point.

## 2020-01-24 NOTE — Assessment & Plan Note (Signed)
He is using tranxene less recently, he is putting up with his situation as is.  Mood d/w pt.  Still on sertraline with relief.  Compliant.

## 2020-01-24 NOTE — Assessment & Plan Note (Signed)
Discussed with patient about follow-up with cardiology when possible.  Statin intolerant.  He agrees.

## 2020-01-24 NOTE — Assessment & Plan Note (Signed)
Living will d/w pt.  Denyse Amass designated if patient were incapacitated.

## 2020-01-24 NOTE — Assessment & Plan Note (Signed)
Tetanus 2010 Flu 2020 PNA 2020 Shingles d/w pt.   covid 2021 Prostate cancer screening deferred at this point.  D/w pt about asking for urology input.   Colon CA screening. D/w pt about f/u when possible.   Living will d/w pt.  [name] designated if patient were incapacitated.   Diet and exercise d/w pt.  He has intentional weight loss with diet changes.  He is still exercising.

## 2020-01-24 NOTE — Assessment & Plan Note (Signed)
He clearly feels better in the meantime.  He isn't back to baseline but is improving.  Still with some fatigue.  Hot and cold cycles are resolved. Cough is better.  Still on abx currently.  Not SOB.  We talked about getting follow-up chest x-ray in 1 month.  Continue as is for now.  He will update me if worse in the meantime.  He agrees.

## 2020-01-24 NOTE — Assessment & Plan Note (Signed)
Mildly inc sugar, d/w pt about diet and exercise.

## 2020-02-04 ENCOUNTER — Other Ambulatory Visit: Payer: Self-pay

## 2020-02-04 ENCOUNTER — Telehealth: Payer: Self-pay

## 2020-02-04 ENCOUNTER — Encounter (HOSPITAL_COMMUNITY): Payer: Self-pay | Admitting: Emergency Medicine

## 2020-02-04 ENCOUNTER — Ambulatory Visit (HOSPITAL_COMMUNITY)
Admission: EM | Admit: 2020-02-04 | Discharge: 2020-02-04 | Disposition: A | Discharge status: Home / Self Care | Payer: Medicare Other | Attending: Internal Medicine | Admitting: Internal Medicine

## 2020-02-04 DIAGNOSIS — Z825 Family history of asthma and other chronic lower respiratory diseases: Secondary | ICD-10-CM | POA: Diagnosis not present

## 2020-02-04 DIAGNOSIS — F419 Anxiety disorder, unspecified: Secondary | ICD-10-CM | POA: Diagnosis not present

## 2020-02-04 DIAGNOSIS — D751 Secondary polycythemia: Secondary | ICD-10-CM | POA: Diagnosis not present

## 2020-02-04 DIAGNOSIS — Z20822 Contact with and (suspected) exposure to covid-19: Secondary | ICD-10-CM | POA: Insufficient documentation

## 2020-02-04 DIAGNOSIS — Z87891 Personal history of nicotine dependence: Secondary | ICD-10-CM | POA: Insufficient documentation

## 2020-02-04 DIAGNOSIS — Z91040 Latex allergy status: Secondary | ICD-10-CM | POA: Insufficient documentation

## 2020-02-04 DIAGNOSIS — Z8673 Personal history of transient ischemic attack (TIA), and cerebral infarction without residual deficits: Secondary | ICD-10-CM | POA: Insufficient documentation

## 2020-02-04 DIAGNOSIS — B349 Viral infection, unspecified: Secondary | ICD-10-CM | POA: Diagnosis not present

## 2020-02-04 DIAGNOSIS — Z882 Allergy status to sulfonamides status: Secondary | ICD-10-CM | POA: Diagnosis not present

## 2020-02-04 DIAGNOSIS — Z888 Allergy status to other drugs, medicaments and biological substances status: Secondary | ICD-10-CM | POA: Diagnosis not present

## 2020-02-04 DIAGNOSIS — Z8551 Personal history of malignant neoplasm of bladder: Secondary | ICD-10-CM | POA: Diagnosis not present

## 2020-02-04 DIAGNOSIS — Z8349 Family history of other endocrine, nutritional and metabolic diseases: Secondary | ICD-10-CM | POA: Insufficient documentation

## 2020-02-04 DIAGNOSIS — Z7901 Long term (current) use of anticoagulants: Secondary | ICD-10-CM | POA: Diagnosis not present

## 2020-02-04 DIAGNOSIS — E039 Hypothyroidism, unspecified: Secondary | ICD-10-CM | POA: Diagnosis not present

## 2020-02-04 DIAGNOSIS — Z8249 Family history of ischemic heart disease and other diseases of the circulatory system: Secondary | ICD-10-CM | POA: Insufficient documentation

## 2020-02-04 DIAGNOSIS — Z79899 Other long term (current) drug therapy: Secondary | ICD-10-CM | POA: Diagnosis not present

## 2020-02-04 DIAGNOSIS — F329 Major depressive disorder, single episode, unspecified: Secondary | ICD-10-CM | POA: Insufficient documentation

## 2020-02-04 DIAGNOSIS — Z86718 Personal history of other venous thrombosis and embolism: Secondary | ICD-10-CM | POA: Diagnosis not present

## 2020-02-04 MED ORDER — LEVOTHYROXINE SODIUM 25 MCG PO TABS: 25.0000 ug | ORAL_TABLET | Freq: Every day | ORAL | 1 refills | Status: AC

## 2020-02-04 NOTE — ED Triage Notes (Signed)
Pt states he recently was dx with diagnosed with pneumonia and was treated and took both antibiotics. Pt reports now he cannot taste or smell. Pt states he still has upper respiratory symptoms and a headache.

## 2020-02-04 NOTE — Telephone Encounter (Signed)
Pt seen [facility] on 01/13/20 with pneumonia; pt had virtual visit 01/20/20. Pt finished the augmentin and levaquin and felt pretty good. 3 days ago pt started back with non prod cough, H/A at forehead (pain level 3); pt is tired and feels weak (moving slower than usual),runny nose and starting shortly after 01/13/20 pt had lost sense of smell or taste.  Pt has no CP,SOB,fever or chills, abd pain or diarrhea. Dr Damita Dunnings said pt should go to UC to be eval and can do covid test. Pt voiced understanding and appreciative. FYI to Dr Damita Dunnings.

## 2020-02-05 LAB — SARS CORONAVIRUS 2 (TAT 6-24 HRS): SARS Coronavirus 2: NEGATIVE

## 2020-02-05 NOTE — Telephone Encounter (Signed)
Agreed.  Thanks.  

## 2020-02-06 NOTE — ED Provider Notes (Addendum)
[facility]    CSN: [id number] Arrival date & time: 02/04/20  1643      History   Chief Complaint Chief Complaint  Patient presents with  . Follow-up    HPI [name] is a 69 y.o. male who was recently completed antibiotics for pneumonia returns to the urgent care with complaints of headache and inability to smell or taste.  No fever or chills.  Admits having some upper respiratory infection symptoms but no cough or sputum production.  No shortness of breath.  Patient has been vaccinated against COVID-19.   No sick contacts.  Patient has a history of hypothyroidism.  Last TSH was 7.1.  Patient does not take levothyroxine at this time.  He complains of increasing fatigue.  No weight change, cold intolerance or confusion.  HPI  Past Medical History:  Diagnosis Date  . Alcohol abuse, unspecified    history of  . Anxiety   . Arthritis    all over  . Bladder cancer [facility]) 2011   Dr. Janice Norrie  . CAD (coronary artery disease) 2012   MI  . Depression   . Dyslipidemia   . History of DVT (deep vein thrombosis) 11/08 and again in 2014   previously on coumadin  . Hypertension   . Inguinal hernia without mention of obstruction or gangrene, unilateral or unspecified, (not specified as recurrent)   . Internal hemorrhoids without mention of complication   . Other abnormal glucose   . Personal history of other disorder of urinary system   . Stroke ([place]) 05/25/2005   TIA  . Thrombocytopenia, unspecified ([place])   . TIA (transient ischemic attack) 2008    Patient Active Problem List   Diagnosis Date Noted  . Healthcare maintenance 01/24/2020  . Advance care planning 01/24/2020  . Hypothyroidism 01/24/2020  . Community acquired pneumonia 01/24/2020  . Vasovagal syncope 05/16/2019  . Other social stressor 02/27/2019  . FH: CAD (coronary artery disease) 02/27/2019  . Polycythemia 01/22/2019  . [place] headache 12/25/2018  . Ocular migraine 12/25/2018  . Rhinorrhea  12/25/2018  . Diarrhea 12/25/2018  . Postphlebitic syndrome 03/15/2018  . Long term (current) use of anticoagulants 03/15/2018  . Varicose veins 03/29/2015  . Back pain 11/15/2014  . CAD - minor - 30-40% LAD, 30% RCA 10/12 06/24/2013  . Essential tremor 01/17/2012  . Pure hypercholesterolemia 06/29/2011  . History of bladder cancer 12/24/2010  . TRANSIENT ISCHEMIC ATTACKS, HX OF 05/01/2010  . HEMATURIA, HX OF 05/01/2010  . Thrombocytopenia ([place]) 01/08/2010  . HYPERGLYCEMIA 08/30/2008  . SHOULDER PAIN, LEFT 02/08/2008  . History of DVT (deep vein thrombosis) 08/08/2007  . Acute thromboembolism of deep veins of lower extremity ([place]) 08/08/2007  . Alcohol abuse, history of 03/05/2007  . Anxiety state 12/30/2006  . DEPRESSION 12/30/2006    Past Surgical History:  Procedure Laterality Date  . CARDIAC CATHETERIZATION  05/2011   mild atherosclerosis  . CYSTOSCOPY  07/12/2011   Procedure: CYSTOSCOPY;  Surgeon: Hanley Ben, MD;  Location: [facility];  Service: Urology;  Laterality: N/A;  one hour being requested for this case  . HERNIA REPAIR  8/11   repair of right indirect and direct hernias; incarerated umbilical hernia containing preperitoneal fat  . SHOULDER SURGERY  03/08/08   Left; arthroscopy  . TONSILLECTOMY     as child  . TRANSURETHRAL RESECTION OF BLADDER TUMOR  07/12/2011   Procedure: TRANSURETHRAL RESECTION OF BLADDER TUMOR (TURBT);  Surgeon: Hanley Ben, MD;  Location: [facility];  Service: Urology;  Laterality: N/A;       Home Medications    Prior to Admission medications   Medication Sig Start Date End Date Taking? Authorizing Provider  clorazepate (TRANXENE) 7.5 MG tablet Take 1 tablet (7.5 mg total) by mouth at bedtime as needed for sleep. 12/24/18  Yes Tonia Ghent, MD  ibuprofen (ADVIL,MOTRIN) 200 MG tablet Take 400 mg by mouth daily as needed for pain.    Yes [provider]  Multiple Vitamin (MULTIVITAMIN WITH MINERALS) TABS tablet Take 1 tablet  by mouth daily.   Yes [provider]  Na Sulfate-K Sulfate-Mg Sulf 17.5-3.13-1.6 GM/177ML SOLN Suprep-Use as directed 12/14/19  Yes Esterwood, Amy S, PA-C  pantoprazole (PROTONIX) 40 MG tablet Take 1 tablet (40 mg total) by mouth every morning. Take  30 minutes before breakfast 12/14/19  Yes Esterwood, Amy S, PA-C  sertraline (ZOLOFT) 100 MG tablet Take 1 tablet (100 mg total) by mouth daily. 12/24/18  Yes Tonia Ghent, MD  XARELTO 20 MG TABS tablet TAKE ONE TABLET BY MOUTH ONCE DAILY WITH A MEAL 11/30/19  Yes Croitoru, Mihai, MD  levothyroxine (SYNTHROID) 25 MCG tablet Take 1 tablet (25 mcg total) by mouth daily before breakfast. 02/04/20   Lyrick Worland, Myrene Galas, MD    Family History Family History  Problem Relation Age of Onset  . Other Father        Hypotension from medication causing GI bleed  . Heart failure Father   . Emphysema Mother   . Prostate cancer Neg Hx     Social History Social History   Tobacco Use  . Smoking status: Former Smoker    Packs/day: 1.50    Years: 25.00    Pack years: 37.50    Types: Cigarettes    Quit date: 08/26/2004    Years since quitting: 15.4  . Smokeless tobacco: Former Network engineer  . Vaping Use: Never used  Substance Use Topics  . Alcohol use: No    Alcohol/week: 0.0 standard drinks    Comment: Quit 2009  . Drug use: Yes    Frequency: 2.0 times per week    Types: Marijuana    Comment: MJ-regular use  10/2013 no longer uses     Allergies   Crestor [rosuvastatin], Escitalopram oxalate, Latex, and Sulfonamide derivatives   Review of Systems Review of Systems  HENT: Positive for congestion and rhinorrhea. Negative for sore throat.   Respiratory: Negative for cough, chest tightness and shortness of breath.   Cardiovascular: Negative for chest pain.  Gastrointestinal: Negative for nausea and vomiting.  Genitourinary: Negative.   Musculoskeletal: Positive for arthralgias.  Skin: Negative.   Neurological: Positive for  headaches. Negative for dizziness and light-headedness.     Physical Exam Triage Vital Signs ED Triage Vitals  Enc Vitals Group     BP 02/04/20 1831 114/70     Pulse Rate 02/04/20 1831 83     Resp 02/04/20 1831 15     Temp 02/04/20 1831 97.7 F (36.5 C)     Temp Source 02/04/20 1831 Oral     SpO2 02/04/20 1831 96 %     Weight --      Height --      Head Circumference --      Peak Flow --      Pain Score 02/04/20 1829 4     Pain Loc --      Pain Edu? --      Excl. in GC? --    No  data found.  Updated Vital Signs BP 114/70 (BP Location: Right Arm)   Pulse 83   Temp 97.7 F (36.5 C) (Oral)   Resp 15   SpO2 96%   Visual Acuity Right Eye Distance:   Left Eye Distance:   Bilateral Distance:    Right Eye Near:   Left Eye Near:    Bilateral Near:     Physical Exam Vitals and nursing note reviewed.  Constitutional:      General: He is not in acute distress.    Appearance: He is not ill-appearing.  HENT:     Nose: No rhinorrhea.     Mouth/Throat:     Pharynx: No posterior oropharyngeal erythema.  Eyes:     Conjunctiva/sclera: Conjunctivae normal.     Pupils: Pupils are equal, round, and reactive to light.  Cardiovascular:     Rate and Rhythm: Normal rate and regular rhythm.     Pulses: Normal pulses.     Heart sounds: Normal heart sounds.  Pulmonary:     Effort: Pulmonary effort is normal. No respiratory distress.     Breath sounds: Normal breath sounds. No wheezing or rhonchi.  Abdominal:     General: Bowel sounds are normal.     Palpations: Abdomen is soft.  Musculoskeletal:     Cervical back: No rigidity.  Lymphadenopathy:     Cervical: No cervical adenopathy.  Neurological:     Mental Status: He is alert.      UC Treatments / Results  Labs (all labs ordered are listed, but only abnormal results are displayed) Labs Reviewed  SARS CORONAVIRUS 2 (TAT 6-24 HRS)    EKG   Radiology No results found.  Procedures Procedures (including  critical care time)  Medications Ordered in UC Medications - No data to display  Initial Impression / Assessment and Plan / UC Course  I have reviewed the triage vital signs and the nursing notes.  Pertinent labs & imaging results that were available during my care of the patient were reviewed by me and considered in my medical decision making (see chart for details).     1.  Viral syndrome: COVID-19 PCR test has been sent Patient is advised to quarantine until COVID-19 test results are available Tylenol/Motrin as needed for fever, chills.  2.  Fatigue in the setting of uncontrolled hypothyroidism Patient does not take levothyroxine at this time TSH was 7.1 Patient is advised to start taking levothyroxine 25 mcg orally daily.  Prescriptions will be sent to the pharmacy. Patient will need a repeat TSH and free T4 6 weeks after he starts taking the levothyroxine. Final Clinical Impressions(s) / UC Diagnoses   Final diagnoses:  Viral syndrome   Discharge Instructions   None    ED Prescriptions    Medication Sig Dispense Auth. Provider   levothyroxine (SYNTHROID) 25 MCG tablet Take 1 tablet (25 mcg total) by mouth daily before breakfast. 90 tablet Wen Munford, Myrene Galas, MD     PDMP not reviewed this encounter.   Chase Picket, MD 02/06/20 1354    Chase Picket, MD 02/06/20 1355

## 2020-02-11 ENCOUNTER — Telehealth: Payer: Self-pay | Admitting: Urology

## 2020-02-11 DIAGNOSIS — C670 Malignant neoplasm of trigone of bladder: Secondary | ICD-10-CM | POA: Diagnosis not present

## 2020-02-11 NOTE — Telephone Encounter (Signed)
Pt called and stated he was in seen in [place] for peyronie's. He states he has more questions regarding his visit. I scheduled him an appt on 7/14. If it is possible, would like a phone call if the appt is not necessary.

## 2020-02-11 NOTE — Telephone Encounter (Signed)
I called and spoke with pt. Pt reports at his office visit today at [facility] he did not report degree of curvature to MD. Wishes to let Dr. Alyson Ingles know his curvature per his partner is 30degrees and would like to attempt to receive xiaflex.

## 2020-02-14 NOTE — Telephone Encounter (Signed)
Have him see me in the [place] office and have him bring a picture of his erection to the visit

## 2020-02-14 NOTE — Telephone Encounter (Signed)
Pt. Notified. And reminded of appt info and given address to office.

## 2020-02-24 ENCOUNTER — Telehealth: Payer: Self-pay

## 2020-02-24 NOTE — Telephone Encounter (Signed)
I spoke with pt; 01/13/20 pt seen UC had pneumonia given abxs and pt felt better then began to fill worse again and pt seen UC on 02/04/20. Pt felt better again and now started feeling worse again, Pt tested neg x 2 for covid; pt had J&J covid vaccine on 11/29/19. Presently pt has fever 99, fatigue, weakness, lightheadedness that is pretty much all the time, slight SOB, still no taste or smell but pt has read where can lose taste and smell with pneumonia. The other notable thing is pt has gotten 4-5 ticks off that were attached and pt thinks got the heads of the ticks; also when pt seen at [facility] on 02/04/20 Dr Lanny Cramp at [facility] restarted pt on levothyroxine 25 mcg daily(01/03/20 TSH 7.17). Pt prefers to see DR Damita Dunnings. Dr Damita Dunnings had said OK to bring in office. UC & ED precautions given and pt voiced understanding. Pt scheduled 30' appt with DR Damita Dunnings on 02/25/20 at 11:30.

## 2020-02-25 ENCOUNTER — Encounter (HOSPITAL_COMMUNITY): Payer: Self-pay

## 2020-02-25 ENCOUNTER — Encounter: Payer: Self-pay | Admitting: Family Medicine

## 2020-02-25 ENCOUNTER — Emergency Department (HOSPITAL_COMMUNITY): Payer: Medicare Other

## 2020-02-25 ENCOUNTER — Emergency Department (HOSPITAL_COMMUNITY)
Admission: EM | Admit: 2020-02-25 | Discharge: 2020-02-25 | Disposition: A | Discharge status: Home / Self Care | Payer: Medicare Other | Attending: Emergency Medicine | Admitting: Emergency Medicine

## 2020-02-25 ENCOUNTER — Other Ambulatory Visit: Payer: Self-pay

## 2020-02-25 ENCOUNTER — Ambulatory Visit (INDEPENDENT_AMBULATORY_CARE_PROVIDER_SITE_OTHER): Payer: Medicare Other | Admitting: Family Medicine

## 2020-02-25 DIAGNOSIS — Z8551 Personal history of malignant neoplasm of bladder: Secondary | ICD-10-CM | POA: Insufficient documentation

## 2020-02-25 DIAGNOSIS — I252 Old myocardial infarction: Secondary | ICD-10-CM | POA: Insufficient documentation

## 2020-02-25 DIAGNOSIS — Z7901 Long term (current) use of anticoagulants: Secondary | ICD-10-CM | POA: Diagnosis not present

## 2020-02-25 DIAGNOSIS — I2510 Atherosclerotic heart disease of native coronary artery without angina pectoris: Secondary | ICD-10-CM | POA: Insufficient documentation

## 2020-02-25 DIAGNOSIS — Y939 Activity, unspecified: Secondary | ICD-10-CM | POA: Insufficient documentation

## 2020-02-25 DIAGNOSIS — J189 Pneumonia, unspecified organism: Secondary | ICD-10-CM | POA: Diagnosis not present

## 2020-02-25 DIAGNOSIS — S30860A Insect bite (nonvenomous) of lower back and pelvis, initial encounter: Secondary | ICD-10-CM | POA: Diagnosis not present

## 2020-02-25 DIAGNOSIS — Y999 Unspecified external cause status: Secondary | ICD-10-CM | POA: Diagnosis not present

## 2020-02-25 DIAGNOSIS — Z87891 Personal history of nicotine dependence: Secondary | ICD-10-CM | POA: Insufficient documentation

## 2020-02-25 DIAGNOSIS — I10 Essential (primary) hypertension: Secondary | ICD-10-CM | POA: Diagnosis not present

## 2020-02-25 DIAGNOSIS — Z86711 Personal history of pulmonary embolism: Secondary | ICD-10-CM | POA: Insufficient documentation

## 2020-02-25 DIAGNOSIS — Z79899 Other long term (current) drug therapy: Secondary | ICD-10-CM | POA: Insufficient documentation

## 2020-02-25 DIAGNOSIS — S30861A Insect bite (nonvenomous) of abdominal wall, initial encounter: Secondary | ICD-10-CM | POA: Diagnosis not present

## 2020-02-25 DIAGNOSIS — S20461A Insect bite (nonvenomous) of right back wall of thorax, initial encounter: Secondary | ICD-10-CM | POA: Insufficient documentation

## 2020-02-25 DIAGNOSIS — Y929 Unspecified place or not applicable: Secondary | ICD-10-CM | POA: Insufficient documentation

## 2020-02-25 DIAGNOSIS — Z86718 Personal history of other venous thrombosis and embolism: Secondary | ICD-10-CM | POA: Insufficient documentation

## 2020-02-25 DIAGNOSIS — W57XXXA Bitten or stung by nonvenomous insect and other nonvenomous arthropods, initial encounter: Secondary | ICD-10-CM | POA: Insufficient documentation

## 2020-02-25 DIAGNOSIS — R531 Weakness: Secondary | ICD-10-CM

## 2020-02-25 DIAGNOSIS — J9811 Atelectasis: Secondary | ICD-10-CM | POA: Diagnosis not present

## 2020-02-25 LAB — CBC
HCT: 50.8 % (ref 39.0–52.0)
Hemoglobin: 17.2 g/dL — ABNORMAL HIGH (ref 13.0–17.0)
MCH: 32.6 pg (ref 26.0–34.0)
MCHC: 33.9 g/dL (ref 30.0–36.0)
MCV: 96.2 fL (ref 80.0–100.0)
Platelets: 160 10*3/uL (ref 150–400)
RBC: 5.28 MIL/uL (ref 4.22–5.81)
RDW: 13.2 % (ref 11.5–15.5)
WBC: 5.9 10*3/uL (ref 4.0–10.5)
nRBC: 0 % (ref 0.0–0.2)

## 2020-02-25 LAB — TROPONIN I (HIGH SENSITIVITY)
Troponin I (High Sensitivity): 5 ng/L (ref <18)
Troponin I (High Sensitivity): 5 ng/L (ref <18)

## 2020-02-25 LAB — BASIC METABOLIC PANEL
Anion gap: 7 (ref 5–15)
BUN: 15 mg/dL (ref 8–23)
CO2: 23 mmol/L (ref 22–32)
Calcium: 9 mg/dL (ref 8.9–10.3)
Chloride: 104 mmol/L (ref 98–111)
Creatinine, Ser: 0.87 mg/dL (ref 0.61–1.24)
GFR calc Af Amer: >60 mL/min (ref >60)
GFR calc non Af Amer: >60 mL/min (ref >60)
Glucose, Bld: 96 mg/dL (ref 70–99)
Potassium: 4 mmol/L (ref 3.5–5.1)
Sodium: 134 mmol/L — ABNORMAL LOW (ref 135–145)

## 2020-02-25 LAB — URINALYSIS, ROUTINE W REFLEX MICROSCOPIC
Bilirubin Urine: NEGATIVE
Glucose, UA: NEGATIVE mg/dL
Hgb urine dipstick: NEGATIVE
Ketones, ur: 20 mg/dL — AB
Leukocytes,Ua: NEGATIVE
Nitrite: NEGATIVE
Protein, ur: NEGATIVE mg/dL
Specific Gravity, Urine: 1.024 (ref 1.005–1.030)
pH: 5 (ref 5.0–8.0)

## 2020-02-25 MED ORDER — DOXYCYCLINE HYCLATE 100 MG PO CAPS
100.0000 mg | ORAL_CAPSULE | Freq: Two times a day (BID) | ORAL | 0 refills | Status: DC
Start: 2020-02-25 — End: 2020-03-08

## 2020-02-25 MED ORDER — DOXYCYCLINE HYCLATE 100 MG PO TABS
100.0000 mg | ORAL_TABLET | Freq: Once | ORAL | Status: AC
  Administered 2020-02-25: 100 mg via ORAL
  Filled 2020-02-25: qty 1

## 2020-02-25 MED ORDER — DOXYCYCLINE HYCLATE 100 MG PO TABS: 100.0000 mg | ORAL_TABLET | Freq: Once | ORAL | Status: DC

## 2020-02-25 MED ORDER — SODIUM CHLORIDE 0.9% FLUSH
3.0000 mL | Freq: Once | INTRAVENOUS | Status: AC
  Administered 2020-02-25: 3 mL via INTRAVENOUS

## 2020-02-25 NOTE — ED Provider Notes (Signed)
[facility] EMERGENCY DEPARTMENT Provider Note   CSN: [id number] Arrival date & time: 02/25/20  1232     History Chief Complaint  Patient presents with  . Weakness    [name] is a 69 y.o. male with a past medical history of DVT and PE currently on Xarelto, prior TIA, hypertension presenting to the ED with a chief complaint of generalized weakness, fever, chills.  Patient reports that he had the [place] vaccine at the end of April.  Shortly after that he was diagnosed and treated for pneumonia.  After that states that his symptoms improved.  However for the past 2 months he is had loss of smell or taste.  For the past week he has been having fever with T-max 101, chills, generalized weakness and a nonproductive cough.  He was seen by his PCP today and had blood pressures of the 75I systolic and sent to the ER.  He does note that he had several tick bites with the most recent 1 being 1 month ago.  He states that "I get 10-12 tick bites a year and nothing really happens."  He denies chest pain.  Does note some shortness of breath over the past week.  He is concerned that he may have pneumonia again.  Denies any abdominal pain, vomiting, leg swelling, injuries or falls.  HPI     Past Medical History:  Diagnosis Date  . Alcohol abuse, unspecified    history of  . Anxiety   . Arthritis    all over  . Bladder cancer [facility]) 2011   Dr. Janice Norrie  . CAD (coronary artery disease) 2012   MI  . Depression   . Dyslipidemia   . History of DVT (deep vein thrombosis) 11/08 and again in 2014   previously on coumadin  . Hypertension   . Inguinal hernia without mention of obstruction or gangrene, unilateral or unspecified, (not specified as recurrent)   . Internal hemorrhoids without mention of complication   . Other abnormal glucose   . Personal history of other disorder of urinary system   . Stroke ([place]) 05/25/2005   TIA  . Thrombocytopenia, unspecified ([place])    . TIA (transient ischemic attack) 2008    Patient Active Problem List   Diagnosis Date Noted  . Healthcare maintenance 01/24/2020  . Advance care planning 01/24/2020  . Hypothyroidism 01/24/2020  . Community acquired pneumonia 01/24/2020  . Vasovagal syncope 05/16/2019  . Other social stressor 02/27/2019  . FH: CAD (coronary artery disease) 02/27/2019  . Polycythemia 01/22/2019  . Thunderclap headache 12/25/2018  . Ocular migraine 12/25/2018  . Rhinorrhea 12/25/2018  . Diarrhea 12/25/2018  . Postphlebitic syndrome 03/15/2018  . Long term (current) use of anticoagulants 03/15/2018  . Varicose veins 03/29/2015  . Back pain 11/15/2014  . CAD - minor - 30-40% LAD, 30% RCA 10/12 06/24/2013  . Essential tremor 01/17/2012  . Pure hypercholesterolemia 06/29/2011  . History of bladder cancer 12/24/2010  . TRANSIENT ISCHEMIC ATTACKS, HX OF 05/01/2010  . HEMATURIA, HX OF 05/01/2010  . Thrombocytopenia ([place]) 01/08/2010  . HYPERGLYCEMIA 08/30/2008  . SHOULDER PAIN, LEFT 02/08/2008  . History of DVT (deep vein thrombosis) 08/08/2007  . Acute thromboembolism of deep veins of lower extremity ([place]) 08/08/2007  . Alcohol abuse, history of 03/05/2007  . Anxiety state 12/30/2006  . DEPRESSION 12/30/2006    Past Surgical History:  Procedure Laterality Date  . CARDIAC CATHETERIZATION  05/2011   mild atherosclerosis  . CYSTOSCOPY  07/12/2011   Procedure: CYSTOSCOPY;  Surgeon: Hanley Ben, MD;  Location: [facility] OR[facility];  Service: Urology;  Laterality: N/A;  one hour being requested for this case  . HERNIA REPAIR  8/11   repair of right indirect and direct hernias; incarerated umbilical hernia containing preperitoneal fat  . SHOULDER SURGERY  03/08/08   Left; arthroscopy  . TONSILLECTOMY     as child  . TRANSURETHRAL RESECTION OF BLADDER TUMOR  07/12/2011   Procedure: TRANSURETHRAL RESECTION OF BLADDER TUMOR (TURBT);  Surgeon: Hanley Ben, MD;  Location: [facility];  Service: Urology;   Laterality: N/A;       Family History  Problem Relation Age of Onset  . Other Father        Hypotension from medication causing GI bleed  . Heart failure Father   . Emphysema Mother   . Prostate cancer Neg Hx     Social History   Tobacco Use  . Smoking status: Former Smoker    Packs/day: 1.50    Years: 25.00    Pack years: 37.50    Types: Cigarettes    Quit date: 08/26/2004    Years since quitting: 15.5  . Smokeless tobacco: Former Network engineer  . Vaping Use: Never used  Substance Use Topics  . Alcohol use: No    Alcohol/week: 0.0 standard drinks    Comment: Quit 2009  . Drug use: Yes    Frequency: 2.0 times per week    Types: Marijuana    Comment: MJ-regular use  10/2013 no longer uses    Home Medications Prior to Admission medications   Medication Sig Start Date End Date Taking? Authorizing Provider  acetaminophen (TYLENOL) 500 MG tablet Take 500 mg by mouth every 6 (six) hours as needed for moderate pain.   Yes [provider]  clorazepate (TRANXENE) 3.75 MG tablet Take 3.75 mg by mouth 2 (two) times daily.  02/09/20  Yes [provider]  ibuprofen (ADVIL,MOTRIN) 200 MG tablet Take 400 mg by mouth daily as needed for pain.    Yes [provider]  levothyroxine (SYNTHROID) 25 MCG tablet Take 1 tablet (25 mcg total) by mouth daily before breakfast. 02/04/20  Yes Lamptey, Myrene Galas, MD  Multiple Vitamin (MULTIVITAMIN WITH MINERALS) TABS tablet Take 1 tablet by mouth daily.   Yes [provider]  pantoprazole (PROTONIX) 40 MG tablet Take 1 tablet (40 mg total) by mouth every morning. Take  30 minutes before breakfast 12/14/19  Yes Esterwood, Amy S, PA-C  sertraline (ZOLOFT) 100 MG tablet Take 1 tablet (100 mg total) by mouth daily. 12/24/18  Yes Tonia Ghent, MD  XARELTO 20 MG TABS tablet TAKE ONE TABLET BY MOUTH ONCE DAILY WITH A MEAL Patient taking differently: Take 20 mg by mouth every morning.  11/30/19  Yes Croitoru, Mihai, MD    zolpidem (AMBIEN CR) 12.5 MG CR tablet Take 12.5 mg by mouth at bedtime. 02/05/20  Yes [provider]  clorazepate (TRANXENE) 7.5 MG tablet Take 1 tablet (7.5 mg total) by mouth at bedtime as needed for sleep. Patient not taking: Reported on 02/25/2020 12/24/18   Tonia Ghent, MD  doxycycline (VIBRAMYCIN) 100 MG capsule Take 1 capsule (100 mg total) by mouth 2 (two) times daily. 02/25/20   Delia Heady, PA-C  Na Sulfate-K Sulfate-Mg Sulf 17.5-3.13-1.6 GM/177ML SOLN Suprep-Use as directed Patient not taking: Reported on 02/25/2020 12/14/19   Esterwood, Amy S, PA-C    Allergies    Crestor [rosuvastatin], Escitalopram oxalate,  Latex, and Sulfonamide derivatives  Review of Systems   Review of Systems  Constitutional: Positive for chills, fatigue and fever. Negative for appetite change.  HENT: Negative for ear pain, rhinorrhea, sneezing and sore throat.   Eyes: Negative for photophobia and visual disturbance.  Respiratory: Positive for cough. Negative for chest tightness, shortness of breath and wheezing.   Cardiovascular: Negative for chest pain and palpitations.  Gastrointestinal: Negative for abdominal pain, blood in stool, constipation, diarrhea, nausea and vomiting.  Genitourinary: Negative for dysuria, hematuria and urgency.  Musculoskeletal: Negative for myalgias.  Skin: Negative for rash.  Neurological: Positive for dizziness. Negative for weakness and light-headedness.    Physical Exam Updated Vital Signs BP 128/75 (BP Location: Right Arm)   Pulse 75   Temp 98.8 F (37.1 C) (Oral)   Resp 16   Ht 6\' 2"  (1.88 m)   Wt 89.4 kg   SpO2 96%   BMI 25.29 kg/m   Physical Exam Vitals and nursing note reviewed.  Constitutional:      General: He is not in acute distress.    Appearance: He is well-developed.  HENT:     Head: Normocephalic and atraumatic.     Nose: Nose normal.  Eyes:     General: No scleral icterus.       Left eye: No discharge.     Conjunctiva/sclera:  Conjunctivae normal.  Cardiovascular:     Rate and Rhythm: Normal rate and regular rhythm.     Heart sounds: Normal heart sounds. No murmur heard.  No friction rub. No gallop.   Pulmonary:     Effort: Pulmonary effort is normal. No respiratory distress.     Breath sounds: Normal breath sounds.  Abdominal:     General: Bowel sounds are normal. There is no distension.     Palpations: Abdomen is soft.     Tenderness: There is no abdominal tenderness. There is no guarding.  Musculoskeletal:        General: Normal range of motion.     Cervical back: Normal range of motion and neck supple.  Skin:    General: Skin is warm and dry.     Findings: Erythema present. No rash.     Comments: Tick bite noted to abdomen and right mid back.  Neurological:     Mental Status: He is alert.     Motor: No abnormal muscle tone.     Coordination: Coordination normal.     ED Results / Procedures / Treatments   Labs (all labs ordered are listed, but only abnormal results are displayed) Labs Reviewed  BASIC METABOLIC PANEL - Abnormal; Notable for the following components:      Result Value   Sodium 134 (*)    All other components within normal limits  CBC - Abnormal; Notable for the following components:   Hemoglobin 17.2 (*)    All other components within normal limits  LYME DISEASE, WESTERN BLOT  URINALYSIS, ROUTINE W REFLEX MICROSCOPIC  ROCKY MTN SPOTTED FVR ABS PNL(IGG+IGM)  EHRLICHIA ANTIBODY PANEL  TROPONIN I (HIGH SENSITIVITY)  TROPONIN I (HIGH SENSITIVITY)    EKG EKG Interpretation  Date/Time:  Friday February 25 2020 12:43:41 EDT Ventricular Rate:  69 PR Interval:  152 QRS Duration: 112 QT Interval:  388 QTC Calculation: 415 R Axis:   -84 Text Interpretation: Normal sinus rhythm Left axis deviation Incomplete right bundle branch block Abnormal ECG No sig change from prior ecg, No STEMI Confirmed by Octaviano Glow [phone]) on 02/25/2020 6:28:20 PM  Radiology DG Chest 2  View  Result Date: 02/25/2020 CLINICAL DATA:  Pneumonia. EXAM: CHEST - 2 VIEW COMPARISON:  Chest x-ray 01/13/2020. FINDINGS: Mediastinum and hilar structures normal. Heart size normal. Mild left base subsegmental atelectasis again noted, improved from prior exam. No pleural effusion or pneumothorax. Thoracic spine scoliosis and degenerative change. IMPRESSION: Mild left base subsegmental atelectasis again noted, improved from prior exam. No acute abnormality identified. Electronically Signed   By: Marcello Moores  Register   On: 02/25/2020 13:19    Procedures Procedures (including critical care time)  Medications Ordered in ED Medications  doxycycline (VIBRA-TABS) tablet 100 mg (has no administration in time range)  sodium chloride flush (NS) 0.9 % injection 3 mL (3 mLs Intravenous Given 02/25/20 1942)  doxycycline (VIBRA-TABS) tablet 100 mg (100 mg Oral Given 02/25/20 2002)    ED Course  I have reviewed the triage vital signs and the nursing notes.  Pertinent labs & imaging results that were available during my care of the patient were reviewed by me and considered in my medical decision making (see chart for details).  Clinical Course as of Feb 25 2055  Fri Feb 25, 5867  [id number] 69 year old male presented to emergency department with fevers, chills, fatigue for the past week.  Patient reports he was treated for pneumonia a few weeks ago with a left lower lobe, improved after 2 rounds of antibiotics.  He was having similar symptoms at the time but the symptoms went away until this week.  They have now returned.  He describes rigors at home.  Describes fatigue.  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  He does not think any of them are on for more than half a day but he is not sure.  He was seen by his family medicine PCP today, who advised to come to the emergency department for more thorough infectious evaluation.  He was noted to be hypotensive in the  office.  He is not on blood pressure medicines.  Here in the emergency department the patient has been here approximately 7 hours.  His blood pressures been stable and normotensive.  His heart rates been within normal limits.  He has been afebrile.  He has no leukocytosis.  His troponins have been flat at 5.  His EKG is nonischemic.  We are waiting on a UA.  I have also decided to send off a Lyme test, [facility] spotted fever, as well as her leukosis antibodies.  Will initiate treatment with doxycycline twice daily for 10 days, if his test are positive his PCP will need to extend these to 21 days   [MT]    Clinical Course User Index [MT] Trifan, Carola Rhine, MD   MDM Rules/Calculators/A&P                          69 year old male with past medical history of DVT and PE currently on Xarelto, prior TIA, hypertension presenting to the ED with a chief complaint of generalized weakness, fever and chills.  He had similar episodes in April 2021 when he was diagnosed and treated for pneumonia.  Symptoms got worse over the past week.  He does report multiple tick bites recently with the most recent 1 being 1 week ago.  States that he usually does not have any symptoms after his tick bites.  She denies any abdominal pain.  On exam lungs are clear  to auscultation bilaterally.  No lower extremity edema, erythema or calf tenderness and he reports compliance with his Xarelto.  He is afebrile without recent use of antipyretics but does report a fever with T-max 101.  Lab work ordered and interpreted; here showing CBC and BMP unremarkable.  Troponin are negative x2.  EKG shows no ischemic changes.  We will send off a Lyme test, [facility] spotted fever and ehrlichia panel.  We will need to treat him with doxycycline for 10 days and follow-up with his PCP.  Patient is agreeable to the plan.  We will have his PCP extend his dose of doxycycline if any of his antibody testing is positive. Urinalysis is pending at this time.   Care handed off to oncoming team pending discharge after UA results. Patient discussed with and seen by the attending, Dr. Langston Masker.   Portions of this note were generated with Lobbyist. Dictation errors may occur despite best attempts at proofreading.  Final Clinical Impression(s) / ED Diagnoses Final diagnoses:  Tick bite, initial encounter  Generalized weakness    Rx / DC Orders ED Discharge Orders         Ordered    doxycycline (VIBRAMYCIN) 100 MG capsule  2 times daily     Discontinue  Reprint     02/25/20 2023           Delia Heady, PA-C 02/25/20 2056    Wyvonnia Dusky, MD 02/26/20 [phone]

## 2020-02-25 NOTE — Progress Notes (Signed)
This visit occurred during the SARS-CoV-2 public health emergency.  Safety protocols were in place, including screening questions prior to the visit, additional usage of staff PPE, and extensive cleaning of exam room while observing appropriate contact time as indicated for disinfecting solutions.  He was feeling well until the last few days.  Fatigue.  Chills.  Lower BP noted today.  Ears ringing.  Lightheaded on standing.  No syncope.  No CP.  4 recent tick bites, abd, R upper back, some on the ankle.  Still on xarelto.  Not on any BP meds. Fever yesterday, 101.  He feels a little SOB but is speaking in complete sentences.  Some mild cough.  No discolored sputum.    Prev treated for PNA.  He was feeling better until a few days ago.    He had covid vaccine 11/29/19.  He has had prev longstanding loss of taste and smell, noted for 2 months.    Meds, vitals, and allergies reviewed.   ROS: Per HPI unless specifically indicated in ROS section   GEN: nad, alert and oriented HEENT: mucous membranes moist, TM wnl NECK: supple w/o LA CV: rrr.  PULM: ctab, no inc wob, no focal dec in BS.   ABD: soft, +bs EXT: no edema SKIN: no acute rash but irritation on tick bit site on the abdomen

## 2020-02-25 NOTE — Telephone Encounter (Signed)
Noted.  Thanks.  We will see at office visit.

## 2020-02-25 NOTE — Discharge Instructions (Addendum)
We will have your primary care provider follow-up with your antibody testing when it is available.  This may take several days. In the meantime you will need to take doxycycline for 10 days.  This treats for both pneumonia and potential tick-borne disease. Follow-up with your primary care provider.  If your tick disease tests are POSITIVE, you may need to be on doxycycline for longer than 10 days.  Your doctor can extend the course of treatment. Return to the ER if you start to experience worsening pain, chest pain, abdominal pain, injuries or falls or neck stiffness.

## 2020-02-25 NOTE — Assessment & Plan Note (Addendum)
History of with prev improvement.  Clearly feeling worse in the last few days.  CTAB but with lower BP and chills needs ER eval.  D/w pt.  He agrees.  Has a driver here, going straight to [facility] ER, I called triage at ER about pending arrival.    Unclear if CAP, tick related, or other issue.  Would likely need IVF, CXR, and labs.  At this point, needs ER eval.  He agrees with plan.  I thank all involved.    At least 30 minutes were devoted to patient care in this encounter (this can potentially include time spent reviewing the patient's file/history, interviewing and examining the patient, counseling/reviewing plan with patient, ordering referrals, ordering tests, reviewing relevant laboratory or x-ray data, and documenting the encounter).

## 2020-02-25 NOTE — ED Triage Notes (Signed)
Pt arrives to ED w/ c/o fever, chills, gen weakness, dizziness. Pt sent by PCP and was hypotensive at office, normotensive here. Pt states he had recently noted several tick bites. Pt recently diagnosed w/ PNA. Pt also states he has not had any sense of smell or taste since May. Pt states he received covid vaccine.

## 2020-02-25 NOTE — ED Notes (Signed)
Patient verbalizes understanding of discharge instructions. Opportunity for questioning and answers were provided. Armband removed by staff, pt discharged from ED in wheelchair to home.   

## 2020-02-28 NOTE — Patient Instructions (Signed)
To ER

## 2020-02-29 DIAGNOSIS — H353112 Nonexudative age-related macular degeneration, right eye, intermediate dry stage: Secondary | ICD-10-CM | POA: Diagnosis not present

## 2020-02-29 DIAGNOSIS — H04123 Dry eye syndrome of bilateral lacrimal glands: Secondary | ICD-10-CM | POA: Diagnosis not present

## 2020-02-29 DIAGNOSIS — H353221 Exudative age-related macular degeneration, left eye, with active choroidal neovascularization: Secondary | ICD-10-CM | POA: Diagnosis not present

## 2020-02-29 DIAGNOSIS — H35372 Puckering of macula, left eye: Secondary | ICD-10-CM | POA: Diagnosis not present

## 2020-02-29 LAB — ROCKY MTN SPOTTED FVR ABS PNL(IGG+IGM)
RMSF IgG: POSITIVE — AB
RMSF IgM: 0.33 index (ref 0.00–0.89)

## 2020-02-29 LAB — RMSF, IGG, IFA: RMSF, IGG, IFA: <1:64 {titer}

## 2020-03-01 ENCOUNTER — Telehealth: Payer: Self-pay | Admitting: Family Medicine

## 2020-03-01 NOTE — Telephone Encounter (Signed)
Please get update on patient.  I am periodically checking back on his pending tick related labs.  His initial RMSF testing was positive but the follow-up RMSF testing was negative, meaning current/active infection was not confirmed.  This can occasionally happen when a patient has a history of tick related illness but not a current infection.  His Lyme and Ehrlichia tests are still pending.  I think it makes sense to continue doxycycline in the meantime.  Please let me know how he is feeling.  Thanks.

## 2020-03-02 LAB — LYME DISEASE, WESTERN BLOT
"  IgG P18 Ab.": ABSENT
"  IgG P23 Ab.": ABSENT
"  IgG P28 Ab.": ABSENT
"  IgG P30 Ab.": ABSENT
"  IgG P39 Ab.": ABSENT
"  IgG P41 Ab.": ABSENT
"  IgG P45 Ab.": ABSENT
"  IgG P58 Ab.": ABSENT
"  IgG P66 Ab.": ABSENT
"  IgG P93 Ab.": ABSENT
"  IgM P23 Ab.": ABSENT
"  IgM P39 Ab.": ABSENT
"  IgM P41 Ab.": ABSENT
Lyme IgG Wb: NEGATIVE
Lyme IgM Wb: NEGATIVE

## 2020-03-02 NOTE — Telephone Encounter (Signed)
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 He has is seeing an opthalmologist for the [facility] and is being treated for it. He is still taking the doxycycline and is feeling better on it. Pt is in good spirits and thanks Dr. Damita Dunnings for everything he has done and the kindness he has shown him. He reports he is still waiting on one test results. He reports his doing everything he is supposed to be doing. He says he can also not still taste or smell anything and the hospital still has diagnosed him with a tick born illness.  Advised if anything is needed to contact this office. Pt verbalized understanding.

## 2020-03-02 NOTE — Telephone Encounter (Addendum)
Notify pt when all tests resulted.  Ehrlichia still pending but lyme neg.  Will await the Ehrlichia test.  Thanks.     =============== Addendum.  Ehrlichia neg.  Would finish doxy.  Please update me as needed.  Thanks.

## 2020-03-03 LAB — EHRLICHIA ANTIBODY PANEL
E chaffeensis (HGE) Ab, IgG: NEGATIVE
E chaffeensis (HGE) Ab, IgM: NEGATIVE
E. Chaffeensis (HME) IgM Titer: NEGATIVE
E.Chaffeensis (HME) IgG: NEGATIVE

## 2020-03-07 ENCOUNTER — Telehealth: Payer: Self-pay | Admitting: *Deleted

## 2020-03-07 NOTE — Telephone Encounter (Signed)
Patent called stating that he was in the hospital with pneumonia. Patient stated that he is waiting for the final results of the lab work that was done while he was in the hospital. Patient stated that lab work was related to ticks.  Patient stated that he is feeling great with the exception of the macular degeneration that occurred shortly after he got out of the hospital. Patient stated that he has seen an ophthalmologist Dr. Katy Fitch and has an upcoming appointment scheduled with a retina specialist.

## 2020-03-08 ENCOUNTER — Ambulatory Visit: Payer: Medicare Other | Admitting: Urology

## 2020-03-08 MED ORDER — DOXYCYCLINE HYCLATE 100 MG PO CAPS: 100.0000 mg | ORAL_CAPSULE | Freq: Two times a day (BID) | ORAL | 0 refills | Status: DC

## 2020-03-08 NOTE — Telephone Encounter (Signed)
Ehrlichia and Lyme tests are negative.  If he is clearly feeling better without any residual symptoms and no fevers then I would not extend the doxycycline.  If he has any fever or return of symptoms in the meantime then I would extend the doxycycline.  I went ahead and sent the refill on the doxycycline to use if he still has any residual symptoms.  If he is feeling fine he does not have to pick it up.  Thanks.

## 2020-03-08 NOTE — Telephone Encounter (Signed)
Pt calls again today to ck on tick lab results. Pt just finished doxycycline last night and pt wants to know since had ticks attached should pt continue taking the doxycyclilne. Pt also wanted to know results of 05820/21 and 02/04/20 covid testing; advised pt per result notes both covid test were negative. Pt voiced understanding and will wait for cb about tick lab results.[email or web address].

## 2020-03-09 NOTE — Telephone Encounter (Signed)
Left message on voicemail of cell phone.

## 2020-03-14 ENCOUNTER — Ambulatory Visit (INDEPENDENT_AMBULATORY_CARE_PROVIDER_SITE_OTHER): Payer: Medicare Other | Admitting: Ophthalmology

## 2020-03-14 ENCOUNTER — Encounter (INDEPENDENT_AMBULATORY_CARE_PROVIDER_SITE_OTHER): Payer: Self-pay | Admitting: Ophthalmology

## 2020-03-14 ENCOUNTER — Other Ambulatory Visit: Payer: Self-pay

## 2020-03-14 ENCOUNTER — Encounter (INDEPENDENT_AMBULATORY_CARE_PROVIDER_SITE_OTHER): Payer: Medicare Other | Admitting: Ophthalmology

## 2020-03-14 DIAGNOSIS — H353221 Exudative age-related macular degeneration, left eye, with active choroidal neovascularization: Secondary | ICD-10-CM | POA: Diagnosis not present

## 2020-03-14 DIAGNOSIS — H353113 Nonexudative age-related macular degeneration, right eye, advanced atrophic without subfoveal involvement: Secondary | ICD-10-CM | POA: Diagnosis not present

## 2020-03-14 DIAGNOSIS — H353222 Exudative age-related macular degeneration, left eye, with inactive choroidal neovascularization: Secondary | ICD-10-CM | POA: Insufficient documentation

## 2020-03-14 DIAGNOSIS — H353124 Nonexudative age-related macular degeneration, left eye, advanced atrophic with subfoveal involvement: Secondary | ICD-10-CM | POA: Diagnosis not present

## 2020-03-14 NOTE — Assessment & Plan Note (Signed)
The nature of wet macular degeneration was discussed with the patient.  Forms of therapy reviewed include the use of Anti-VEGF medications injected painlessly into the eye, as well as other possible treatment modalities, including thermal laser therapy. Fellow eye involvement and risks were discussed with the patient. Upon the finding of wet age related macular degeneration, treatment will be offered. The treatment regimen is on a treat as needed basis with the intent to treat if necessary and extend interval of exams when possible. On average 1 out of 6 patients do not need lifetime therapy. However, the risk of recurrent disease is high for a lifetime.  Initially monthly, then periodic, examinations and evaluations will determine whether the next treatment is required on the day of the examination.  The nature of age realated macular degeneration (ARMD)is explained as follows: The dry form refers to the progressive loss of normal blood supply to the central vision as a result of a combination of factors which include aging blood supply (arteriosclerosis, hardening of the arteries), genetics, smoking habits, and history of hypertension. Currently, no eye medications or vitamins slow this type of aging effect upon vision, however cessation of smoking and controlling hypertension help slow the disorder. The following analogy helps explain this: I describe the dry form of ARMD like a house of the same age as your eyes, which shows typical wear and tear of age upon the house structure and appearance. Like the aging house which can fall down structurally, the dry form of ARMD can deteriorate the structure of the macula (center) of the retina, most often gradually and affect central vision tasks such as reading and driving. The wet form of ARMD refers to the development of abnormally growing blood vessels usually near or under the central vision, with potential risk of permanent visual changes or vision losses. This  complication of the Dry form of ARMD may be moderately reduced by use of AREDS 2 formula multivitamins daily. I describe the Wet form of ARMD as like the development of a fire in an aging house (DRY ARMD). It may develop suddenly, progress rapidly and be destructive based on where it starts and how big the fire is when found. In the eye, the house fire analogy refers to the abnormal blood vessels growing destructively near the central vison in the retina, or film of the eye. Halting the growth of blood vessels with laser (hot or cold) or injectable medications is best way "to put the fire out". Many patients will experience a stabilization or even improvement in vision with a treatment course, while others may still face a loss of vision. The use of injectable medications has revolutionized therapy and is currently the only proven therapy to provide the chance of stable or improved acuity from new and recent destructive wet ARMD.

## 2020-03-14 NOTE — Progress Notes (Signed)
03/14/2020     CHIEF COMPLAINT Patient presents for Retina Evaluation   HISTORY OF PRESENT ILLNESS: [name] is a 69 y.o. male who presents to the clinic today for:   HPI    Retina Evaluation    In both eyes.  This started 2 months ago.  Duration of 2 months.  Associated Symptoms Distortion.  Negative for Flashes and Floaters.  Context:  distance vision and near vision.          Comments    Retinal Evaluation per Dr. Katy Fitch for wet AMD OS, ERM OS and Moderate AMD OD.  Pt states he vision has been blurry and he has a hard time reading small print.        Last edited by Melburn Popper, COA on 03/14/2020  9:26 AM. (History)      Referring physician: Tonia Ghent, MD [place],   [id number]  HISTORICAL INFORMATION:   Selected notes from the [facility]: No current outpatient medications on file. (Ophthalmic Drugs)   No current facility-administered medications for this visit. (Ophthalmic Drugs)   Current Outpatient Medications (Other)  Medication Sig  . acetaminophen (TYLENOL) 500 MG tablet Take 500 mg by mouth every 6 (six) hours as needed for moderate pain.  . clorazepate (TRANXENE) 3.75 MG tablet Take 3.75 mg by mouth 2 (two) times daily.   . clorazepate (TRANXENE) 7.5 MG tablet Take 1 tablet (7.5 mg total) by mouth at bedtime as needed for sleep. (Patient not taking: Reported on 02/25/2020)  . doxycycline (VIBRAMYCIN) 100 MG capsule Take 1 capsule (100 mg total) by mouth 2 (two) times daily.  Marland Kitchen ibuprofen (ADVIL,MOTRIN) 200 MG tablet Take 400 mg by mouth daily as needed for pain.   Marland Kitchen levothyroxine (SYNTHROID) 25 MCG tablet Take 1 tablet (25 mcg total) by mouth daily before breakfast.  . Multiple Vitamin (MULTIVITAMIN WITH MINERALS) TABS tablet Take 1 tablet by mouth daily.  . Na Sulfate-K Sulfate-Mg Sulf 17.5-3.13-1.6 GM/177ML SOLN Suprep-Use as directed (Patient not taking: Reported on 02/25/2020)    . pantoprazole (PROTONIX) 40 MG tablet Take 1 tablet (40 mg total) by mouth every morning. Take  30 minutes before breakfast  . sertraline (ZOLOFT) 100 MG tablet Take 1 tablet (100 mg total) by mouth daily.  Alveda Reasons 20 MG TABS tablet TAKE ONE TABLET BY MOUTH ONCE DAILY WITH A MEAL (Patient taking differently: Take 20 mg by mouth every morning. )  . zolpidem (AMBIEN CR) 12.5 MG CR tablet Take 12.5 mg by mouth at bedtime.   No current facility-administered medications for this visit. (Other)      REVIEW OF SYSTEMS:    ALLERGIES Allergies  Allergen Reactions  . Crestor [Rosuvastatin]     Mood changes  . Escitalopram Oxalate     Mood changes, irritablity  . Latex Other (See Comments)    Blood in urine with latex catheter- but this was likely from mechanical irritation, not from the latex itself.    . Sulfonamide Derivatives Rash    PAST MEDICAL HISTORY Past Medical History:  Diagnosis Date  . Alcohol abuse, unspecified    history of  . Anxiety   . Arthritis    all over  . Bladder cancer [facility]) 2011   Dr. Janice Norrie  . CAD (coronary artery disease) 2012   MI  . Depression   . Dyslipidemia   . History of DVT (deep vein thrombosis) 11/08  and again in 2014   previously on coumadin  . Hypertension   . Inguinal hernia without mention of obstruction or gangrene, unilateral or unspecified, (not specified as recurrent)   . Internal hemorrhoids without mention of complication   . Other abnormal glucose   . Personal history of other disorder of urinary system   . Stroke ([place]) 05/25/2005   TIA  . Thrombocytopenia, unspecified ([place])   . TIA (transient ischemic attack) 2008   Past Surgical History:  Procedure Laterality Date  . CARDIAC CATHETERIZATION  05/2011   mild atherosclerosis  . CYSTOSCOPY  07/12/2011   Procedure: CYSTOSCOPY;  Surgeon: Hanley Ben, MD;  Location: [facility];  Service: Urology;  Laterality: N/A;  one hour being requested for this case  . HERNIA REPAIR  8/11    repair of right indirect and direct hernias; incarerated umbilical hernia containing preperitoneal fat  . SHOULDER SURGERY  03/08/08   Left; arthroscopy  . TONSILLECTOMY     as child  . TRANSURETHRAL RESECTION OF BLADDER TUMOR  07/12/2011   Procedure: TRANSURETHRAL RESECTION OF BLADDER TUMOR (TURBT);  Surgeon: Hanley Ben, MD;  Location: [facility];  Service: Urology;  Laterality: N/A;    FAMILY HISTORY Family History  Problem Relation Age of Onset  . Other Father        Hypotension from medication causing GI bleed  . Heart failure Father   . Emphysema Mother   . Prostate cancer Neg Hx     SOCIAL HISTORY Social History   Tobacco Use  . Smoking status: Former Smoker    Packs/day: 1.50    Years: 25.00    Pack years: 37.50    Types: Cigarettes    Quit date: 08/26/2004    Years since quitting: 15.6  . Smokeless tobacco: Former Network engineer  . Vaping Use: Never used  Substance Use Topics  . Alcohol use: No    Alcohol/week: 0.0 standard drinks    Comment: Quit 2009  . Drug use: Yes    Frequency: 2.0 times per week    Types: Marijuana    Comment: MJ-regular use  10/2013 no longer uses         OPHTHALMIC EXAM:  Base Eye Exam    Visual Acuity (ETDRS)      Right Left   Dist [place] 20/50 -2 20/70 -2   Dist ph [place] 20/25 -2 NI       Tonometry (Tonopen, 9:32 AM)      Right Left   Pressure 18 19       Pupils      Pupils Dark Light Shape React APD   Right PERRL 4 3 Round Brisk None   Left PERRL 4 3 Round Brisk None       Visual Fields (Counting fingers)      Left Right    Full Full       Extraocular Movement      Right Left    Full Full       Neuro/Psych    Oriented x3: Yes   Mood/Affect: Normal       Dilation    Both eyes: 1.0% Mydriacyl, 2.5% Phenylephrine @ 9:32 AM        Slit Lamp and Fundus Exam    External Exam      Right Left   External Normal Normal       Slit Lamp Exam      Right Left   Lids/Lashes Normal Normal  Conjunctiva/Sclera White and quiet White and quiet   Cornea Clear Clear   Anterior Chamber Deep and quiet Deep and quiet   Iris Round and reactive Round and reactive   Lens 2+ Nuclear sclerosis 2+ Nuclear sclerosis   Anterior Vitreous Normal Normal       Fundus Exam      Right Left   Posterior Vitreous Posterior vitreous detachment Normal   Disc Peripapillary atrophy Normal   C/D Ratio 0.55 0.5   Macula Retinal pigment epithelial mottling, Hard drusen, Geographic atrophy Geographic atrophy, Macular thickening, Hard drusen   Vessels Normal Normal   Periphery Normal Normal          IMAGING AND PROCEDURES  Imaging and Procedures for 04/04/20  OCT, Retina - OU - Both Eyes       Right Eye Quality was good. Scan locations included subfoveal. Central Foveal Thickness: 274. Progression has been stable. Findings include retinal drusen , no SRF, abnormal foveal contour, subretinal hyper-reflective material.   Left Eye Quality was good. Scan locations included subfoveal. Central Foveal Thickness: 293. Progression has been stable. Findings include retinal drusen , no SRF, abnormal foveal contour, subretinal hyper-reflective material.   Notes No signs of CNVM, OU,, WITH RPE  Abnormalities.        Color Fundus Photography Optos - OU - Both Eyes       Right Eye Progression has been stable. Disc findings include normal observations. Macula : geographic atrophy. Vessels : normal observations. Periphery : normal observations.   Left Eye Progression has been stable. Disc findings include normal observations. Macula : geographic atrophy. Vessels : normal observations. Periphery : normal observations.   Notes No signs of CNVM OU,, OBSERVE       Fluorescein Angiography [place] (Transit OS)       Injection:  500 mg Fluorescein Sodium 10 % injection   NDC: [phone]   Route: Intravenous, Site: Left ArmRight Eye   Progression has no prior data. Early phase findings  include window defect. Mid/Late phase findings include window defect. Choroidal neovascularization is not present.   Left Eye   Progression has no prior data. Early phase findings include window defect. Mid/Late phase findings include window defect. Choroidal neovascularization is not present.                 ASSESSMENT/PLAN:  Advanced nonexudative age-related macular degeneration of right eye without subfoveal involvement The nature of dry age related macular degeneration was discussed with the patient as well as its possible conversion to wet. The results of the AREDS 2 study was discussed with the patient. A diet rich in dark leafy green vegetables was advised and specific recommendations were made regarding supplements with AREDS 2 formulation . Control of hypertension and serum cholesterol may slow the disease. Smoking cessation is mandatory to slow the disease and diminish the risk of progressing to wet age related macular degeneration. The patient was instructed in the use of an [place] and was told to return immediately for any changes in the Grid. Stressed to the patient do not rub eyes  Exudative age-related macular degeneration of left eye with active choroidal neovascularization ([place]) The nature of wet macular degeneration was discussed with the patient.  Forms of therapy reviewed include the use of Anti-VEGF medications injected painlessly into the eye, as well as other possible treatment modalities, including thermal laser therapy. Fellow eye involvement and risks were discussed with the patient. Upon the finding of wet age related macular degeneration, treatment  will be offered. The treatment regimen is on a treat as needed basis with the intent to treat if necessary and extend interval of exams when possible. On average 1 out of 6 patients do not need lifetime therapy. However, the risk of recurrent disease is high for a lifetime.  Initially monthly, then periodic,  examinations and evaluations will determine whether the next treatment is required on the day of the examination.  The nature of age realated macular degeneration (ARMD)is explained as follows: The dry form refers to the progressive loss of normal blood supply to the central vision as a result of a combination of factors which include aging blood supply (arteriosclerosis, hardening of the arteries), genetics, smoking habits, and history of hypertension. Currently, no eye medications or vitamins slow this type of aging effect upon vision, however cessation of smoking and controlling hypertension help slow the disorder. The following analogy helps explain this: I describe the dry form of ARMD like a house of the same age as your eyes, which shows typical wear and tear of age upon the house structure and appearance. Like the aging house which can fall down structurally, the dry form of ARMD can deteriorate the structure of the macula (center) of the retina, most often gradually and affect central vision tasks such as reading and driving. The wet form of ARMD refers to the development of abnormally growing blood vessels usually near or under the central vision, with potential risk of permanent visual changes or vision losses. This complication of the Dry form of ARMD may be moderately reduced by use of AREDS 2 formula multivitamins daily. I describe the Wet form of ARMD as like the development of a fire in an aging house (DRY ARMD). It may develop suddenly, progress rapidly and be destructive based on where it starts and how big the fire is when found. In the eye, the house fire analogy refers to the abnormal blood vessels growing destructively near the central vison in the retina, or film of the eye. Halting the growth of blood vessels with laser (hot or cold) or injectable medications is best way "to put the fire out". Many patients will experience a stabilization or even improvement in vision with a treatment  course, while others may still face a loss of vision. The use of injectable medications has revolutionized therapy and is currently the only proven therapy to provide the chance of stable or improved acuity from new and recent destructive wet ARMD.      ICD-10-CM   1. Exudative age-related macular degeneration of left eye with active choroidal neovascularization (HCC)  H35.3221 OCT, Retina - OU - Both Eyes    Color Fundus Photography Optos - OU - Both Eyes    Fluorescein Angiography [place] (Transit OS)    Fluorescein Sodium 10 % injection 500 mg  2. Advanced nonexudative age-related macular degeneration of right eye without subfoveal involvement  H35.3113 OCT, Retina - OU - Both Eyes    Color Fundus Photography Optos - OU - Both Eyes    Fluorescein Angiography [place] (Transit OS)    Fluorescein Sodium 10 % injection 500 mg  3. Advanced nonexudative age-related macular degeneration of left eye with subfoveal involvement  H35.3124 OCT, Retina - OU - Both Eyes    Color Fundus Photography Optos - OU - Both Eyes    Fluorescein Angiography [place] (Transit OS)    Fluorescein Sodium 10 % injection 500 mg    1.  I reviewed with the patient other treatment  alternatives for the left eye and the natural course of the disease.  2.  Patient return for follow-up visit within a week for intravitreal injection of Avastin OS, no dilation required at that visit  3.  Ophthalmic Meds Ordered this visit:  Meds ordered this encounter  Medications  . Fluorescein Sodium 10 % injection 500 mg       Return in about 1 week (around 03/21/2020) for AVASTIN OCT, OS, NO DILATE.  Patient Instructions  Age-Related Macular Degeneration  Age-related macular degeneration (AMD) is an eye disease related to aging. The disease causes a loss of central vision. Central vision allows a person to see objects clearly and do daily tasks like reading and driving. There are two main types of AMD:  Dry AMD. People  with this type generally lose their vision slowly. This is the most common type of AMD. Some people with dry AMD notice very little change in their vision as they age.  Wet AMD. People with this type can lose their vision quickly. What are the causes? This condition is caused by damage to the part of the eye that provides you with central vision (macula).  Dry AMD happens when deposits in the macula cause light-sensitive cells to slowly break down.  Wet AMD happens when abnormal blood vessels grow under the macula and leak blood and fluid. What increases the risk? You are more likely to develop this condition if you:  Are 60 years old or older, and especially 68 years old or older.  Smoke.  Are obese.  Have a family history of AMD.  Have high cholesterol, high blood pressure, or heart disease.  Have been exposed to high levels of ultraviolet (UV) light and blue light.  Are white (Caucasian).  Are male. What are the signs or symptoms? Common symptoms of this condition include:  Blurred vision, especially when reading print material. The blurred vision often improves in brighter light.  A blurred or blind spot in the center of your field of vision that is small but growing larger.  Bright colors seeming less bright than they used to be.  Decreased ability to recognize and see faces.  One eye seeing worse than the other.  Decreased ability to adapt to dimly lit rooms.  Straight lines appearing crooked or wavy. How is this diagnosed? This condition is diagnosed based on your symptoms and an eye exam. During the eye exam:  Eye drops will be placed into your eyes to enlarge (dilate) your pupils. This will allow your health care provider to see the back of your eye.  You may be asked to look at an image that looks like a checkerboard (Amsler grid). Early changes in your central vision may cause the grid to appear distorted. After the exam, you may be given one or both of  these tests:  Fluorescein angiogram. This test determines whether you have dry or wet AMD.  Optical coherence tomography (OCT) test to evaluate deep layers of the retina. How is this treated? There is no cure for this condition, but treatment can help to slow down progression of the disease. This condition may be treated with:  Supplements, including vitamin C, vitamin E, beta carotene, and zinc.  Laser surgery to destroy new blood vessels or leaking blood vessels in your eye.  Injections of medicines into your eye to slow down the formation of abnormal blood vessels that may leak. These injections may need to be repeated on a routine basis. Follow these instructions  at home:  Take over-the-counter and prescription medicines only as told by your health care provider.  Take vitamins and supplements as told by your health care provider.  Ask your health care provider for an Amsler grid. Use it every day to check each eye for vision changes.  Get an eye exam as often as told by your health care provider. Make sure to get an eye exam at least once every year.  Keep all follow-up visits as told by your health care provider. This is important. Contact a health care provider if:  You notice any new changes in your vision. Get help right away if:  You suddenly lose vision or develop pain in the eye. Summary  Age-related macular degeneration (AMD) is an eye disease related to aging. There are two types of this condition: dry AMD and wet AMD.  This condition is caused by damage to the part of the eye that provides you with central vision (macula).  Once diagnosed with AMD, make sure to get an eye exam every year, take supplements and vitamins as directed, use an Amsler grid at home, and follow up with your health care provider. This information is not intended to replace advice given to you by your health care provider. Make sure you discuss any questions you have with your health care  provider. Document Revised: 02/18/2018 Document Reviewed: 02/18/2018 Elsevier Patient Education  [place] the diagnoses, plan, and follow up with the patient and they expressed understanding.  Patient expressed understanding of the importance of proper follow up care.   Clent Demark Cassian Torelli M.D. Diseases & Surgery of the Retina and Vitreous [facility] [place] 04/04/20     Abbreviations: M myopia (nearsighted); A astigmatism; H hyperopia (farsighted); P presbyopia; Mrx spectacle prescription;  CTL contact lenses; OD right eye; OS left eye; OU both eyes  XT exotropia; ET esotropia; PEK punctate epithelial keratitis; PEE punctate epithelial erosions; DES dry eye syndrome; MGD meibomian gland dysfunction; ATs artificial tears; PFAT's preservative free artificial tears; [place] nuclear sclerotic cataract; PSC posterior subcapsular cataract; ERM epi-retinal membrane; PVD posterior vitreous detachment; RD retinal detachment; DM diabetes mellitus; DR diabetic retinopathy; NPDR non-proliferative diabetic retinopathy; PDR proliferative diabetic retinopathy; CSME clinically significant macular edema; DME diabetic macular edema; dbh dot blot hemorrhages; CWS cotton wool spot; POAG primary open angle glaucoma; C/D cup-to-disc ratio; HVF humphrey visual field; GVF goldmann visual field; OCT optical coherence tomography; IOP intraocular pressure; BRVO Branch retinal vein occlusion; CRVO central retinal vein occlusion; CRAO central retinal artery occlusion; BRAO branch retinal artery occlusion; RT retinal tear; SB scleral buckle; PPV pars plana vitrectomy; VH Vitreous hemorrhage; PRP panretinal laser photocoagulation; IVK intravitreal kenalog; VMT vitreomacular traction; MH Macular hole;  NVD neovascularization of the disc; NVE neovascularization elsewhere; AREDS age related eye disease study; ARMD age related macular degeneration; POAG primary open angle glaucoma; EBMD epithelial/anterior  basement membrane dystrophy; ACIOL anterior chamber intraocular lens; IOL intraocular lens; PCIOL posterior chamber intraocular lens; Phaco/IOL phacoemulsification with intraocular lens placement; [place] photorefractive keratectomy; LASIK laser assisted in situ keratomileusis; HTN hypertension; DM diabetes mellitus; COPD chronic obstructive pulmonary disease

## 2020-03-14 NOTE — Assessment & Plan Note (Signed)

## 2020-03-14 NOTE — Patient Instructions (Signed)
Age-Related Macular Degeneration  Age-related macular degeneration (AMD) is an eye disease related to aging. The disease causes a loss of central vision. Central vision allows a person to see objects clearly and do daily tasks like reading and driving. There are two main types of AMD:  Dry AMD. People with this type generally lose their vision slowly. This is the most common type of AMD. Some people with dry AMD notice very little change in their vision as they age.  Wet AMD. People with this type can lose their vision quickly. What are the causes? This condition is caused by damage to the part of the eye that provides you with central vision (macula).  Dry AMD happens when deposits in the macula cause light-sensitive cells to slowly break down.  Wet AMD happens when abnormal blood vessels grow under the macula and leak blood and fluid. What increases the risk? You are more likely to develop this condition if you:  Are 50 years old or older, and especially 75 years old or older.  Smoke.  Are obese.  Have a family history of AMD.  Have high cholesterol, high blood pressure, or heart disease.  Have been exposed to high levels of ultraviolet (UV) light and blue light.  Are white (Caucasian).  Are male. What are the signs or symptoms? Common symptoms of this condition include:  Blurred vision, especially when reading print material. The blurred vision often improves in brighter light.  A blurred or blind spot in the center of your field of vision that is small but growing larger.  Bright colors seeming less bright than they used to be.  Decreased ability to recognize and see faces.  One eye seeing worse than the other.  Decreased ability to adapt to dimly lit rooms.  Straight lines appearing crooked or wavy. How is this diagnosed? This condition is diagnosed based on your symptoms and an eye exam. During the eye exam:  Eye drops will be placed into your eyes to  enlarge (dilate) your pupils. This will allow your health care provider to see the back of your eye.  You may be asked to look at an image that looks like a checkerboard (Amsler grid). Early changes in your central vision may cause the grid to appear distorted. After the exam, you may be given one or both of these tests:  Fluorescein angiogram. This test determines whether you have dry or wet AMD.  Optical coherence tomography (OCT) test to evaluate deep layers of the retina. How is this treated? There is no cure for this condition, but treatment can help to slow down progression of the disease. This condition may be treated with:  Supplements, including vitamin C, vitamin E, beta carotene, and zinc.  Laser surgery to destroy new blood vessels or leaking blood vessels in your eye.  Injections of medicines into your eye to slow down the formation of abnormal blood vessels that may leak. These injections may need to be repeated on a routine basis. Follow these instructions at home:  Take over-the-counter and prescription medicines only as told by your health care provider.  Take vitamins and supplements as told by your health care provider.  Ask your health care provider for an Amsler grid. Use it every day to check each eye for vision changes.  Get an eye exam as often as told by your health care provider. Make sure to get an eye exam at least once every year.  Keep all follow-up visits as told by   your health care provider. This is important. Contact a health care provider if:  You notice any new changes in your vision. Get help right away if:  You suddenly lose vision or develop pain in the eye. Summary  Age-related macular degeneration (AMD) is an eye disease related to aging. There are two types of this condition: dry AMD and wet AMD.  This condition is caused by damage to the part of the eye that provides you with central vision (macula).  Once diagnosed with AMD, make sure  to get an eye exam every year, take supplements and vitamins as directed, use an Amsler grid at home, and follow up with your health care provider. This information is not intended to replace advice given to you by your health care provider. Make sure you discuss any questions you have with your health care provider. Document Revised: 02/18/2018 Document Reviewed: 02/18/2018 Elsevier Patient Education  2020 [employer]  

## 2020-03-21 ENCOUNTER — Other Ambulatory Visit: Payer: Self-pay

## 2020-03-21 ENCOUNTER — Ambulatory Visit (INDEPENDENT_AMBULATORY_CARE_PROVIDER_SITE_OTHER): Payer: Medicare Other | Admitting: Ophthalmology

## 2020-03-21 ENCOUNTER — Encounter (INDEPENDENT_AMBULATORY_CARE_PROVIDER_SITE_OTHER): Payer: Self-pay | Admitting: Ophthalmology

## 2020-03-21 DIAGNOSIS — H353221 Exudative age-related macular degeneration, left eye, with active choroidal neovascularization: Secondary | ICD-10-CM | POA: Diagnosis not present

## 2020-03-21 MED ORDER — BEVACIZUMAB CHEMO INJECTION 1.25MG/0.05ML SYRINGE FOR KALEIDOSCOPE
1.2500 mg | INTRAVITREAL | Status: AC | PRN
  Administered 2020-03-21: 1.25 mg via INTRAVITREAL

## 2020-03-21 NOTE — Progress Notes (Signed)
03/21/2020     CHIEF COMPLAINT Patient presents for Retina Follow Up   HISTORY OF PRESENT ILLNESS: [name] is a 69 y.o. male who presents to the clinic today for:   HPI    Retina Follow Up    Patient presents with  Wet AMD.  In left eye.  This started 1 week ago.  Severity is mild.  Duration of 1 week.  Since onset it is stable.          Comments    1 Week Avastin OS  Pt denies noticeable changes to [place] OU since last visit. Pt denies ocular pain, flashes of light, or floaters OU.         Last edited by Rockie Neighbours, [place] on 03/21/2020  8:18 AM. (History)      Referring physician: Tonia Ghent, MD [place],  [place] [address]  HISTORICAL INFORMATION:   Selected notes from the [facility]: No current outpatient medications on file. (Ophthalmic Drugs)   No current facility-administered medications for this visit. (Ophthalmic Drugs)   Current Outpatient Medications (Other)  Medication Sig  . acetaminophen (TYLENOL) 500 MG tablet Take 500 mg by mouth every 6 (six) hours as needed for moderate pain.  . clorazepate (TRANXENE) 3.75 MG tablet Take 3.75 mg by mouth 2 (two) times daily.   . clorazepate (TRANXENE) 7.5 MG tablet Take 1 tablet (7.5 mg total) by mouth at bedtime as needed for sleep. (Patient not taking: Reported on 02/25/2020)  . doxycycline (VIBRAMYCIN) 100 MG capsule Take 1 capsule (100 mg total) by mouth 2 (two) times daily.  Marland Kitchen ibuprofen (ADVIL,MOTRIN) 200 MG tablet Take 400 mg by mouth daily as needed for pain.   Marland Kitchen levothyroxine (SYNTHROID) 25 MCG tablet Take 1 tablet (25 mcg total) by mouth daily before breakfast.  . Multiple Vitamin (MULTIVITAMIN WITH MINERALS) TABS tablet Take 1 tablet by mouth daily.  . Na Sulfate-K Sulfate-Mg Sulf 17.5-3.13-1.6 GM/177ML SOLN Suprep-Use as directed (Patient not taking: Reported on 02/25/2020)  . pantoprazole (PROTONIX) 40 MG tablet Take 1 tablet (40 mg total)  by mouth every morning. Take  30 minutes before breakfast  . sertraline (ZOLOFT) 100 MG tablet Take 1 tablet (100 mg total) by mouth daily.  Alveda Reasons 20 MG TABS tablet TAKE ONE TABLET BY MOUTH ONCE DAILY WITH A MEAL (Patient taking differently: Take 20 mg by mouth every morning. )  . zolpidem (AMBIEN CR) 12.5 MG CR tablet Take 12.5 mg by mouth at bedtime.   No current facility-administered medications for this visit. (Other)      REVIEW OF SYSTEMS:    ALLERGIES Allergies  Allergen Reactions  . Crestor [Rosuvastatin]     Mood changes  . Escitalopram Oxalate     Mood changes, irritablity  . Latex Other (See Comments)    Blood in urine with latex catheter- but this was likely from mechanical irritation, not from the latex itself.    . Sulfonamide Derivatives Rash    PAST MEDICAL HISTORY Past Medical History:  Diagnosis Date  . Alcohol abuse, unspecified    history of  . Anxiety   . Arthritis    all over  . Bladder cancer [facility]) 2011   Dr. Janice Norrie  . CAD (coronary artery disease) 2012   MI  . Depression   . Dyslipidemia   . History of DVT (deep vein thrombosis) 11/08 and again in 2014   previously  on coumadin  . Hypertension   . Inguinal hernia without mention of obstruction or gangrene, unilateral or unspecified, (not specified as recurrent)   . Internal hemorrhoids without mention of complication   . Other abnormal glucose   . Personal history of other disorder of urinary system   . Stroke ([place]) 05/25/2005   TIA  . Thrombocytopenia, unspecified ([place])   . TIA (transient ischemic attack) 2008   Past Surgical History:  Procedure Laterality Date  . CARDIAC CATHETERIZATION  05/2011   mild atherosclerosis  . CYSTOSCOPY  07/12/2011   Procedure: CYSTOSCOPY;  Surgeon: Hanley Ben, MD;  Location: [facility];  Service: Urology;  Laterality: N/A;  one hour being requested for this case  . HERNIA REPAIR  8/11   repair of right indirect and direct hernias; incarerated  umbilical hernia containing preperitoneal fat  . SHOULDER SURGERY  03/08/08   Left; arthroscopy  . TONSILLECTOMY     as child  . TRANSURETHRAL RESECTION OF BLADDER TUMOR  07/12/2011   Procedure: TRANSURETHRAL RESECTION OF BLADDER TUMOR (TURBT);  Surgeon: Hanley Ben, MD;  Location: [facility];  Service: Urology;  Laterality: N/A;    FAMILY HISTORY Family History  Problem Relation Age of Onset  . Other Father        Hypotension from medication causing GI bleed  . Heart failure Father   . Emphysema Mother   . Prostate cancer Neg Hx     SOCIAL HISTORY Social History   Tobacco Use  . Smoking status: Former Smoker    Packs/day: 1.50    Years: 25.00    Pack years: 37.50    Types: Cigarettes    Quit date: 08/26/2004    Years since quitting: 15.5  . Smokeless tobacco: Former Network engineer  . Vaping Use: Never used  Substance Use Topics  . Alcohol use: No    Alcohol/week: 0.0 standard drinks    Comment: Quit 2009  . Drug use: Yes    Frequency: 2.0 times per week    Types: Marijuana    Comment: MJ-regular use  10/2013 no longer uses         OPHTHALMIC EXAM:  Base Eye Exam    Visual Acuity (ETDRS)      Right Left   Dist [place] 20/60 +1 20/80 -2   Dist ph [place] 20/30 -1 NI       Tonometry (Tonopen, 8:22 AM)      Right Left   Pressure 22 24  Pt holding breath       Pupils      Pupils Dark Light Shape React APD   Right PERRL 4 3 Round Brisk None   Left PERRL 4 3 Round Brisk None       Visual Fields (Counting fingers)      Left Right    Full Full       Extraocular Movement      Right Left    Full Full       Neuro/Psych    Oriented x3: Yes   Mood/Affect: Normal       Dilation   Deferred today per GAR          IMAGING AND PROCEDURES  Imaging and Procedures for 03/21/20  Intravitreal Injection, Pharmacologic Agent - OS - Left Eye       Time Out 03/21/2020. 8:47 AM. Confirmed correct patient, procedure, site, and patient consented.    Anesthesia Topical anesthesia was used. Anesthetic medications included Akten 3.5%.  Procedure Preparation included 10% betadine to eyelids, 5% betadine to ocular surface, Ofloxacin . A 30 gauge needle was used.   Injection:  1.25 mg Bevacizumab (AVASTIN) SOLN   NDC: [address]-8, Lot: [id number]   Route: Intravitreal, Site: Left Eye, Waste: 0 mg  Post-op Post injection exam found visual acuity of at least counting fingers. The patient tolerated the procedure well. There were no complications. The patient received written and verbal post procedure care education. Post injection medications were not given.                 ASSESSMENT/PLAN:  No problem-specific Assessment & Plan notes found for this encounter.      ICD-10-CM   1. Exudative age-related macular degeneration of left eye with active choroidal neovascularization (HCC)  H35.3221 Intravitreal Injection, Pharmacologic Agent - OS - Left Eye    Bevacizumab (AVASTIN) SOLN 1.25 mg    CANCELED: OCT, Retina - OU - Both Eyes    1.  2.  3.  Ophthalmic Meds Ordered this visit:  Meds ordered this encounter  Medications  . Bevacizumab (AVASTIN) SOLN 1.25 mg       Return in about 6 weeks (around 05/02/2020) for dilate, OS, AVASTIN OCT.  There are no Patient Instructions on file for this visit.   Explained the diagnoses, plan, and follow up with the patient and they expressed understanding.  Patient expressed understanding of the importance of proper follow up care.   Clent Demark Lyne Khurana M.D. Diseases & Surgery of the Retina and Vitreous Retina & Diabetic [place] 03/21/20     Abbreviations: M myopia (nearsighted); A astigmatism; H hyperopia (farsighted); P presbyopia; Mrx spectacle prescription;  CTL contact lenses; OD right eye; OS left eye; OU both eyes  XT exotropia; ET esotropia; PEK punctate epithelial keratitis; PEE punctate epithelial erosions; DES dry eye syndrome; MGD meibomian gland dysfunction; ATs  artificial tears; PFAT's preservative free artificial tears; [place] nuclear sclerotic cataract; PSC posterior subcapsular cataract; ERM epi-retinal membrane; PVD posterior vitreous detachment; RD retinal detachment; DM diabetes mellitus; DR diabetic retinopathy; NPDR non-proliferative diabetic retinopathy; PDR proliferative diabetic retinopathy; CSME clinically significant macular edema; DME diabetic macular edema; dbh dot blot hemorrhages; CWS cotton wool spot; POAG primary open angle glaucoma; C/D cup-to-disc ratio; HVF humphrey visual field; GVF goldmann visual field; OCT optical coherence tomography; IOP intraocular pressure; BRVO Branch retinal vein occlusion; CRVO central retinal vein occlusion; CRAO central retinal artery occlusion; BRAO branch retinal artery occlusion; RT retinal tear; SB scleral buckle; PPV pars plana vitrectomy; VH Vitreous hemorrhage; PRP panretinal laser photocoagulation; IVK intravitreal kenalog; VMT vitreomacular traction; MH Macular hole;  NVD neovascularization of the disc; NVE neovascularization elsewhere; AREDS age related eye disease study; ARMD age related macular degeneration; POAG primary open angle glaucoma; EBMD epithelial/anterior basement membrane dystrophy; ACIOL anterior chamber intraocular lens; IOL intraocular lens; PCIOL posterior chamber intraocular lens; Phaco/IOL phacoemulsification with intraocular lens placement; [place] photorefractive keratectomy; LASIK laser assisted in situ keratomileusis; HTN hypertension; DM diabetes mellitus; COPD chronic obstructive pulmonary disease

## 2020-03-22 DIAGNOSIS — F411 Generalized anxiety disorder: Secondary | ICD-10-CM | POA: Diagnosis not present

## 2020-03-22 DIAGNOSIS — F3181 Bipolar II disorder: Secondary | ICD-10-CM | POA: Diagnosis not present

## 2020-04-04 MED ORDER — FLUORESCEIN SODIUM 10 % IV SOLN
500.0000 mg | INTRAVENOUS | Status: AC | PRN
  Administered 2020-04-04: 500 mg via INTRAVENOUS

## 2020-04-19 DIAGNOSIS — F411 Generalized anxiety disorder: Secondary | ICD-10-CM | POA: Diagnosis not present

## 2020-05-02 ENCOUNTER — Other Ambulatory Visit: Payer: Self-pay

## 2020-05-02 ENCOUNTER — Encounter (INDEPENDENT_AMBULATORY_CARE_PROVIDER_SITE_OTHER): Payer: Self-pay | Admitting: Ophthalmology

## 2020-05-02 ENCOUNTER — Ambulatory Visit (INDEPENDENT_AMBULATORY_CARE_PROVIDER_SITE_OTHER): Payer: Medicare Other | Admitting: Ophthalmology

## 2020-05-02 DIAGNOSIS — H353221 Exudative age-related macular degeneration, left eye, with active choroidal neovascularization: Secondary | ICD-10-CM

## 2020-05-02 MED ORDER — BEVACIZUMAB CHEMO INJECTION 1.25MG/0.05ML SYRINGE FOR KALEIDOSCOPE
1.2500 mg | INTRAVITREAL | Status: AC | PRN
  Administered 2020-05-02: 1.25 mg via INTRAVITREAL

## 2020-05-02 NOTE — Progress Notes (Signed)
05/02/2020     CHIEF COMPLAINT Patient presents for Retina Follow Up   HISTORY OF PRESENT ILLNESS: [name] is a 69 y.o. male who presents to the clinic today for:   HPI    Retina Follow Up    Patient presents with  Wet AMD.  In left eye.  Severity is moderate.  Duration of 6 weeks.  Since onset it is stable.  I, the attending physician,  performed the HPI with the patient and updated documentation appropriately.          Comments    6 week Wet AMD f\u OS. Possible Avastin OS. OCT  Pt states no changes in vision. Pt states he sees a floater that looks like a rain drop.       Last edited by Tilda Franco on 05/02/2020 10:05 AM. (History)      Referring physician: Tonia Ghent, MD [place],  [place] [address]  HISTORICAL INFORMATION:   Selected notes from the [facility]: No current outpatient medications on file. (Ophthalmic Drugs)   No current facility-administered medications for this visit. (Ophthalmic Drugs)   Current Outpatient Medications (Other)  Medication Sig  . acetaminophen (TYLENOL) 500 MG tablet Take 500 mg by mouth every 6 (six) hours as needed for moderate pain.  . clorazepate (TRANXENE) 3.75 MG tablet Take 3.75 mg by mouth 2 (two) times daily.   . clorazepate (TRANXENE) 7.5 MG tablet Take 1 tablet (7.5 mg total) by mouth at bedtime as needed for sleep. (Patient not taking: Reported on 02/25/2020)  . doxycycline (VIBRAMYCIN) 100 MG capsule Take 1 capsule (100 mg total) by mouth 2 (two) times daily.  Marland Kitchen ibuprofen (ADVIL,MOTRIN) 200 MG tablet Take 400 mg by mouth daily as needed for pain.   Marland Kitchen levothyroxine (SYNTHROID) 25 MCG tablet Take 1 tablet (25 mcg total) by mouth daily before breakfast.  . Multiple Vitamin (MULTIVITAMIN WITH MINERALS) TABS tablet Take 1 tablet by mouth daily.  . Na Sulfate-K Sulfate-Mg Sulf 17.5-3.13-1.6 GM/177ML SOLN Suprep-Use as directed (Patient not taking: Reported  on 02/25/2020)  . pantoprazole (PROTONIX) 40 MG tablet Take 1 tablet (40 mg total) by mouth every morning. Take  30 minutes before breakfast  . sertraline (ZOLOFT) 100 MG tablet Take 1 tablet (100 mg total) by mouth daily.  Alveda Reasons 20 MG TABS tablet TAKE ONE TABLET BY MOUTH ONCE DAILY WITH A MEAL (Patient taking differently: Take 20 mg by mouth every morning. )  . zolpidem (AMBIEN CR) 12.5 MG CR tablet Take 12.5 mg by mouth at bedtime.   No current facility-administered medications for this visit. (Other)      REVIEW OF SYSTEMS:    ALLERGIES Allergies  Allergen Reactions  . Crestor [Rosuvastatin]     Mood changes  . Escitalopram Oxalate     Mood changes, irritablity  . Latex Other (See Comments)    Blood in urine with latex catheter- but this was likely from mechanical irritation, not from the latex itself.    . Sulfonamide Derivatives Rash    PAST MEDICAL HISTORY Past Medical History:  Diagnosis Date  . Alcohol abuse, unspecified    history of  . Anxiety   . Arthritis    all over  . Bladder cancer [facility]) 2011   Dr. Janice Norrie  . CAD (coronary artery disease) 2012   MI  . Depression   . Dyslipidemia   . History of DVT (  deep vein thrombosis) 11/08 and again in 2014   previously on coumadin  . Hypertension   . Inguinal hernia without mention of obstruction or gangrene, unilateral or unspecified, (not specified as recurrent)   . Internal hemorrhoids without mention of complication   . Other abnormal glucose   . Personal history of other disorder of urinary system   . Stroke ([place]) 05/25/2005   TIA  . Thrombocytopenia, unspecified ([place])   . TIA (transient ischemic attack) 2008   Past Surgical History:  Procedure Laterality Date  . CARDIAC CATHETERIZATION  05/2011   mild atherosclerosis  . CYSTOSCOPY  07/12/2011   Procedure: CYSTOSCOPY;  Surgeon: Hanley Ben, MD;  Location: [facility];  Service: Urology;  Laterality: N/A;  one hour being requested for this case  . HERNIA  REPAIR  8/11   repair of right indirect and direct hernias; incarerated umbilical hernia containing preperitoneal fat  . SHOULDER SURGERY  03/08/08   Left; arthroscopy  . TONSILLECTOMY     as child  . TRANSURETHRAL RESECTION OF BLADDER TUMOR  07/12/2011   Procedure: TRANSURETHRAL RESECTION OF BLADDER TUMOR (TURBT);  Surgeon: Hanley Ben, MD;  Location: [facility];  Service: Urology;  Laterality: N/A;    FAMILY HISTORY Family History  Problem Relation Age of Onset  . Other Father        Hypotension from medication causing GI bleed  . Heart failure Father   . Emphysema Mother   . Prostate cancer Neg Hx     SOCIAL HISTORY Social History   Tobacco Use  . Smoking status: Former Smoker    Packs/day: 1.50    Years: 25.00    Pack years: 37.50    Types: Cigarettes    Quit date: 08/26/2004    Years since quitting: 15.6  . Smokeless tobacco: Former Network engineer  . Vaping Use: Never used  Substance Use Topics  . Alcohol use: No    Alcohol/week: 0.0 standard drinks    Comment: Quit 2009  . Drug use: Yes    Frequency: 2.0 times per week    Types: Marijuana    Comment: MJ-regular use  10/2013 no longer uses         OPHTHALMIC EXAM:  Base Eye Exam    Visual Acuity (Snellen - Linear)      Right Left   Dist [place] 20/50 -1 20/80 -1   Dist ph [place] 20/30 20/80 +       Tonometry (Tonopen, 9:59 AM)      Right Left   Pressure 19 19       Pupils      Pupils Dark Light Shape React APD   Right PERRL 4 3 Round Brisk None   Left PERRL 4 3 Round Brisk None       Visual Fields (Counting fingers)      Left Right    Full Full       Neuro/Psych    Oriented x3: Yes   Mood/Affect: Normal       Dilation    Left eye: 1.0% Mydriacyl, 2.5% Phenylephrine @ 9:59 AM        Slit Lamp and Fundus Exam    External Exam      Right Left   External Normal Normal       Slit Lamp Exam      Right Left   Lids/Lashes Normal Normal   Conjunctiva/Sclera White and quiet White and quiet    Cornea Clear Clear  Anterior Chamber Deep and quiet Deep and quiet   Iris Round and reactive Round and reactive   Lens 2+ Nuclear sclerosis 2+ Nuclear sclerosis   Anterior Vitreous Normal Normal       Fundus Exam      Right Left   Posterior Vitreous  Normal   Disc  Normal   C/D Ratio  0.5   Macula  Geographic atrophy, Macular thickening, Hard drusen, Disciform scar   Vessels  Normal   Periphery  Normal          IMAGING AND PROCEDURES  Imaging and Procedures for 05/02/20  OCT, Retina - OU - Both Eyes       Right Eye Quality was good. Scan locations included subfoveal. Central Foveal Thickness: 285. Progression has been stable. Findings include retinal drusen , no SRF, abnormal foveal contour, subretinal hyper-reflective material.   Left Eye Quality was good. Scan locations included subfoveal. Central Foveal Thickness: 290. Progression has been stable. Findings include retinal drusen , no SRF, abnormal foveal contour, subretinal hyper-reflective material.   Notes No signs of CNVM, OD, WITH RPE  Abnormalities.  Subfoveal disciform white fibrotic scar clinically OS represented on the examination and on the OCT as an inactive lesion currently with destruction of the outer photoreceptor layers and elements accounting for acuity       Intravitreal Injection, Pharmacologic Agent - OS - Left Eye       Time Out 05/02/2020. 10:17 AM. Confirmed correct patient, procedure, site, and patient consented.   Anesthesia Topical anesthesia was used. Anesthetic medications included Akten 3.5%.   Procedure Preparation included 10% betadine to eyelids, 5% betadine to ocular surface, Ofloxacin . A 30 gauge needle was used.   Injection:  1.25 mg Bevacizumab (AVASTIN) SOLN   NDC: [address]-3, Lot: [id number]   Route: Intravitreal, Site: Left Eye, Waste: 0 mg  Post-op Post injection exam found visual acuity of at least counting fingers. The patient tolerated the procedure well. There were no  complications. The patient received written and verbal post procedure care education. Post injection medications were not given.                 ASSESSMENT/PLAN:  Exudative age-related macular degeneration of left eye with active choroidal neovascularization (HCC)  Subfoveal disciform white fibrotic scar clinically OS represented on the examination and on the OCT as an inactive lesion currently with destruction of the outer photoreceptor layers and elements accounting for acuity    Today follow-up examination at 6-week interval OS, controlled lesion yet need injection today and examination in 6 to 7 weeks      ICD-10-CM   1. Exudative age-related macular degeneration of left eye with active choroidal neovascularization (HCC)  H35.3221 OCT, Retina - OU - Both Eyes    Intravitreal Injection, Pharmacologic Agent - OS - Left Eye    Bevacizumab (AVASTIN) SOLN 1.25 mg    1.  Repeat injection intravitreal Avastin OS today and examination in 6 weeks  2.  3.  Ophthalmic Meds Ordered this visit:  Meds ordered this encounter  Medications  . Bevacizumab (AVASTIN) SOLN 1.25 mg       Return in about 6 weeks (around 06/13/2020) for dilate, OS, AVASTIN OCT.  Patient Instructions  Patient to report any new onset visual acuity declines or distortions    Explained the diagnoses, plan, and follow up with the patient and they expressed understanding.  Patient expressed understanding of the importance of proper follow up care.   Clent Demark  Tove Wideman M.D. Diseases & Surgery of the Retina and Vitreous Retina & Diabetic [place] 05/02/20     Abbreviations: M myopia (nearsighted); A astigmatism; H hyperopia (farsighted); P presbyopia; Mrx spectacle prescription;  CTL contact lenses; OD right eye; OS left eye; OU both eyes  XT exotropia; ET esotropia; PEK punctate epithelial keratitis; PEE punctate epithelial erosions; DES dry eye syndrome; MGD meibomian gland dysfunction; ATs artificial  tears; PFAT's preservative free artificial tears; [place] nuclear sclerotic cataract; PSC posterior subcapsular cataract; ERM epi-retinal membrane; PVD posterior vitreous detachment; RD retinal detachment; DM diabetes mellitus; DR diabetic retinopathy; NPDR non-proliferative diabetic retinopathy; PDR proliferative diabetic retinopathy; CSME clinically significant macular edema; DME diabetic macular edema; dbh dot blot hemorrhages; CWS cotton wool spot; POAG primary open angle glaucoma; C/D cup-to-disc ratio; HVF humphrey visual field; GVF goldmann visual field; OCT optical coherence tomography; IOP intraocular pressure; BRVO Branch retinal vein occlusion; CRVO central retinal vein occlusion; CRAO central retinal artery occlusion; BRAO branch retinal artery occlusion; RT retinal tear; SB scleral buckle; PPV pars plana vitrectomy; VH Vitreous hemorrhage; PRP panretinal laser photocoagulation; IVK intravitreal kenalog; VMT vitreomacular traction; MH Macular hole;  NVD neovascularization of the disc; NVE neovascularization elsewhere; AREDS age related eye disease study; ARMD age related macular degeneration; POAG primary open angle glaucoma; EBMD epithelial/anterior basement membrane dystrophy; ACIOL anterior chamber intraocular lens; IOL intraocular lens; PCIOL posterior chamber intraocular lens; Phaco/IOL phacoemulsification with intraocular lens placement; [place] photorefractive keratectomy; LASIK laser assisted in situ keratomileusis; HTN hypertension; DM diabetes mellitus; COPD chronic obstructive pulmonary disease

## 2020-05-02 NOTE — Assessment & Plan Note (Signed)
Subfoveal disciform white fibrotic scar clinically OS represented on the examination and on the OCT as an inactive lesion currently with destruction of the outer photoreceptor layers and elements accounting for acuity    Today follow-up examination at 6-week interval OS, controlled lesion yet need injection today and examination in 6 to 7 weeks

## 2020-05-02 NOTE — Patient Instructions (Signed)
Patient to report any new onset visual acuity declines or distortions

## 2020-05-04 ENCOUNTER — Other Ambulatory Visit: Payer: Self-pay | Admitting: Adult Health

## 2020-05-06 DIAGNOSIS — L03115 Cellulitis of right lower limb: Secondary | ICD-10-CM | POA: Diagnosis not present

## 2020-05-08 ENCOUNTER — Other Ambulatory Visit: Payer: Self-pay

## 2020-05-08 ENCOUNTER — Encounter: Payer: Self-pay | Admitting: Family Medicine

## 2020-05-08 ENCOUNTER — Ambulatory Visit (INDEPENDENT_AMBULATORY_CARE_PROVIDER_SITE_OTHER): Payer: Medicare Other | Admitting: Family Medicine

## 2020-05-08 DIAGNOSIS — L0390 Cellulitis, unspecified: Secondary | ICD-10-CM

## 2020-05-08 DIAGNOSIS — H353124 Nonexudative age-related macular degeneration, left eye, advanced atrophic with subfoveal involvement: Secondary | ICD-10-CM

## 2020-05-08 NOTE — Patient Instructions (Signed)
Finish the keflex. Keep it covered.  Update me as needed.  I'll update/ask advice from cardiology and the eye clinic.  Take care.  Glad to see you.

## 2020-05-08 NOTE — Progress Notes (Signed)
This visit occurred during the SARS-CoV-2 public health emergency.  Safety protocols were in place, including screening questions prior to the visit, additional usage of staff PPE, and extensive cleaning of exam room while observing appropriate contact time as indicated for disinfecting solutions.  He has macular degeneration and with eye clinic f/u and injection tx in the meantime.  He didn't know if xarelto was contributing. I asked him to check with the eye clinic about this.  I don't know of a causative link, unclear if it could make bleeding worse.  Discussed with patient.No hx of bleeding w/o injury o/w.    He was moving a heavy latter about 10 days ago.  He scraped his leg, when it hit his leg.  He dressed it in the meantime, washed the area.  He was concerned about local infection.  Was seen at [facility] over the weekend.  Started on kelfex in the meantime.  Wound looks better in the meantime, "not red and angry".  Swelling is better.    He still doesn't have a sense of smell or taste, this has been an issue for a month.    He had 4 facial stings from wasps w/o sig sx in the meantime.  Not having residual symptoms at this point.  Meds, vitals, and allergies reviewed.   ROS: Per HPI unless specifically indicated in ROS section   nad ncat Neck supple, no LA rrr 3x3 R anterior shin with mild erythema and small abrasion.  No fluctuant mass.

## 2020-05-10 ENCOUNTER — Telehealth: Payer: Self-pay | Admitting: Family Medicine

## 2020-05-10 DIAGNOSIS — L0390 Cellulitis, unspecified: Secondary | ICD-10-CM | POA: Insufficient documentation

## 2020-05-10 NOTE — Assessment & Plan Note (Signed)
I well ask about xarelto use with Dr. Zadie Rhine and Dr. Sallyanne Kuster.  I advised him not to stop the medication in the meantime.

## 2020-05-10 NOTE — Telephone Encounter (Signed)
I am asking on behalf of the patient.  He wanted to know if Xarelto use was likely to increase retinal bleeding associated with macular degeneration.  I advised him not to stop the medication in the meantime.  Thanks.

## 2020-05-10 NOTE — Assessment & Plan Note (Signed)
Improving.  Continue Keflex.  He will update me as needed.  Routine cautions given to patient.  He agrees.

## 2020-05-10 NOTE — Telephone Encounter (Signed)
Unfortunately, any anticoagulant can increase the risk of retinal bleeding (although the anticoagulant does not start the bleeding, it can make it worse).  Any medicine that helps [place] prevent clots will increase the risk of bleeding.  Finally bring that guy over I would leave it up to the ophthalmologist/retina specialist to tell us when the risk of bleeding and permanent eye damage is high.  They will often request that we temporarily stop anticoagulants, although I found it to be rare that the ophthalmologist will require permanent discontinuation of the anticoagulant. Warfarin appears to be the worst culprit for bleeding complications in the eyes.  There is registry data that suggest that Xarelto (rivaroxaban) is worse than Eliquis (apixaban) at causing bleeding complications in the eyes.  This may be related to the fact that Xarelto was administered in a once daily higher-dose with higher peak effects. If Abbe Amsterdam is worried about the risk of eye bleeding, I would suggest switching to Eliquis 5 mg twice daily.  I would always defer to Dr. Dahlia Bailiff opinion as to whether the anticoagulant needs to be discontinued either temporarily or permanently.

## 2020-05-10 NOTE — Telephone Encounter (Signed)
Appreciate cardiology note.  Will await update from Dr. Zadie Rhine.

## 2020-05-11 NOTE — Telephone Encounter (Addendum)
Please update patient.  See below.  Would continue anticoagulation.  Cardiology and ophthalmology agree with continued use.  Thanks.  Appreciate help of all involved.

## 2020-05-11 NOTE — Telephone Encounter (Signed)
Left detailed message on voicemail. Number identified.

## 2020-05-11 NOTE — Telephone Encounter (Signed)
The cardiologist note is absolutely correct.  The anticoagulant does not initiate bleeding.  I do not recommend holding anticoagulants in patients requiring them to maintain or maximize perfusion of the cerebral vasculature or the myocardium.  The risk of death or stroke is much worse than the minor risk of increased bleeding.  Warfarin was the major culprit in the past due to improper monitoring or changes in the INR to high levels.  1 could argue that anticoagulant such as Eliquis and Xarelto would actually maximize the retinal perfusion and that of the choroidal vasculature which would maximize the outer retinal perfusion and potentially slow wet AMD development.Again I do not recommend halting or changing from Xarelto or Eliquis in those patients to require Coumadin simply assuring that the INR is appropriate therapeutic level

## 2020-05-25 ENCOUNTER — Ambulatory Visit (INDEPENDENT_AMBULATORY_CARE_PROVIDER_SITE_OTHER): Payer: Medicare Other | Admitting: Orthopedic Surgery

## 2020-05-25 ENCOUNTER — Other Ambulatory Visit: Payer: Self-pay

## 2020-05-25 ENCOUNTER — Encounter: Payer: Self-pay | Admitting: Orthopedic Surgery

## 2020-05-25 VITALS — Ht 74.0 in | Wt 201.0 lb

## 2020-05-25 DIAGNOSIS — M79674 Pain in right toe(s): Secondary | ICD-10-CM

## 2020-05-25 DIAGNOSIS — M6701 Short Achilles tendon (acquired), right ankle: Secondary | ICD-10-CM

## 2020-05-25 DIAGNOSIS — M2021 Hallux rigidus, right foot: Secondary | ICD-10-CM | POA: Diagnosis not present

## 2020-05-30 DIAGNOSIS — F411 Generalized anxiety disorder: Secondary | ICD-10-CM | POA: Diagnosis not present

## 2020-06-01 ENCOUNTER — Encounter: Payer: Self-pay | Admitting: Orthopedic Surgery

## 2020-06-01 NOTE — Progress Notes (Signed)
Office Visit Note   Patient: [name]           Date of Birth: 1950/10/23           MRN: [id number] Visit Date: 05/25/2020              Requested by: Tonia Ghent, MD [address] [place],  [place] [address] PCP: Tonia Ghent, MD  Chief Complaint  Patient presents with  . Right Foot - Pain      HPI: Patient is a 69 year old gentleman who presents in follow-up for Achilles contracture and right great toe pain. Patient states that last week his toe was red and stiff. Patient states he has been soaking his foot and wearing flip-flops. Patient feels pain sticking into his toe.  Patient is a Environmental manager.  Assessment & Plan: Visit Diagnoses:  1. Toe pain, right   2. Achilles tendon contracture, right     Plan: Patient does have Achilles contracture and hallux rigidus with venous swelling. Recommend that he wear compression stockings size large for the venous swelling continue stretching and a stiff soled shoe. Discussed that we may need to consider a gastrocnemius recession.  Follow-Up Instructions: Return if symptoms worsen or fail to improve.   Ortho Exam  Patient is alert, oriented, no adenopathy, well-dressed, normal affect, normal respiratory effort. Examination patient has a good dorsalis pedis pulse he has hallux rigidus with only about 10 degrees full range of motion of the great toe which is painful. With the knee extended he has dorsiflexion 10 degrees short of neutral with Achilles contracture patient does have a traumatic blister on the right leg from dropping a ladder in the leg there is callus beneath the great toe. There is venous stasis swelling with brawny edema in the leg. His calf measures 39 cm in circumference.  Imaging: No results found. No images are attached to the encounter.  Labs: No results found for: HGBA1C, ESRSEDRATE, CRP, LABURIC, REPTSTATUS, GRAMSTAIN, CULT, LABORGA   Lab Results  Component Value Date   ALBUMIN 4.0  01/03/2020   ALBUMIN 3.8 05/08/2019   ALBUMIN 3.9 01/02/2017    Lab Results  Component Value Date   MG 2.3 02/18/2019   No results found for: VD25OH  No results found for: PREALBUMIN CBC EXTENDED Latest Ref Rng & Units 02/25/2020 01/03/2020 05/08/2019  WBC 4.0 - 10.5 K/uL 5.9 4.0 6.6  RBC 4.22 - 5.81 MIL/uL 5.28 4.95 5.25  HGB 13.0 - 17.0 g/dL 17.2(H) 16.5 17.4(H)  HCT 39 - 52 % 50.8 47.9 50.7  PLT 150 - 400 K/uL 160 145.0(L) 147(L)  NEUTROABS 1.4 - 7.7 K/uL - 2.1 4.4  LYMPHSABS 0.7 - 4.0 K/uL - 1.3 1.4     Body mass index is 25.81 kg/m.  Orders:  No orders of the defined types were placed in this encounter.  No orders of the defined types were placed in this encounter.    Procedures: No procedures performed  Clinical Data: No additional findings.  ROS:  All other systems negative, except as noted in the HPI. Review of Systems  Objective: Vital Signs: Ht 6\' 2"  (1.88 m)   Wt 201 lb (91.2 kg)   BMI 25.81 kg/m   Specialty Comments:  No specialty comments available.  PMFS History: Patient Active Problem List   Diagnosis Date Noted  . Cellulitis 05/10/2020  . Exudative age-related macular degeneration of left eye with active choroidal neovascularization ([place]) 03/14/2020  . Advanced nonexudative age-related macular  degeneration of right eye without subfoveal involvement 03/14/2020  . Advanced nonexudative age-related macular degeneration of left eye with subfoveal involvement 03/14/2020  . Healthcare maintenance 01/24/2020  . Advance care planning 01/24/2020  . Hypothyroidism 01/24/2020  . Community acquired pneumonia 01/24/2020  . Vasovagal syncope 05/16/2019  . Other social stressor 02/27/2019  . FH: CAD (coronary artery disease) 02/27/2019  . Polycythemia 01/22/2019  . [place] headache 12/25/2018  . Ocular migraine 12/25/2018  . Rhinorrhea 12/25/2018  . Diarrhea 12/25/2018  . Postphlebitic syndrome 03/15/2018  . Long term (current) use of  anticoagulants 03/15/2018  . Varicose veins 03/29/2015  . Back pain 11/15/2014  . CAD - minor - 30-40% LAD, 30% RCA 10/12 06/24/2013  . Essential tremor 01/17/2012  . Pure hypercholesterolemia 06/29/2011  . History of bladder cancer 12/24/2010  . TRANSIENT ISCHEMIC ATTACKS, HX OF 05/01/2010  . HEMATURIA, HX OF 05/01/2010  . Thrombocytopenia ([place]) 01/08/2010  . HYPERGLYCEMIA 08/30/2008  . SHOULDER PAIN, LEFT 02/08/2008  . History of DVT (deep vein thrombosis) 08/08/2007  . Acute thromboembolism of deep veins of lower extremity ([place]) 08/08/2007  . Alcohol abuse, history of 03/05/2007  . Anxiety state 12/30/2006  . DEPRESSION 12/30/2006   Past Medical History:  Diagnosis Date  . Alcohol abuse, unspecified    history of  . Anxiety   . Arthritis    all over  . Bladder cancer [facility]) 2011   Dr. Janice Norrie  . CAD (coronary artery disease) 2012   MI  . Depression   . Dyslipidemia   . History of DVT (deep vein thrombosis) 11/08 and again in 2014   previously on coumadin  . Hypertension   . Inguinal hernia without mention of obstruction or gangrene, unilateral or unspecified, (not specified as recurrent)   . Internal hemorrhoids without mention of complication   . Other abnormal glucose   . Personal history of other disorder of urinary system   . Stroke ([place]) 05/25/2005   TIA  . Thrombocytopenia, unspecified ([place])   . TIA (transient ischemic attack) 2008    Family History  Problem Relation Age of Onset  . Other Father        Hypotension from medication causing GI bleed  . Heart failure Father   . Emphysema Mother   . Prostate cancer Neg Hx     Past Surgical History:  Procedure Laterality Date  . CARDIAC CATHETERIZATION  05/2011   mild atherosclerosis  . CYSTOSCOPY  07/12/2011   Procedure: CYSTOSCOPY;  Surgeon: Hanley Ben, MD;  Location: [facility];  Service: Urology;  Laterality: N/A;  one hour being requested for this case  . HERNIA REPAIR  8/11   repair of right indirect  and direct hernias; incarerated umbilical hernia containing preperitoneal fat  . SHOULDER SURGERY  03/08/08   Left; arthroscopy  . TONSILLECTOMY     as child  . TRANSURETHRAL RESECTION OF BLADDER TUMOR  07/12/2011   Procedure: TRANSURETHRAL RESECTION OF BLADDER TUMOR (TURBT);  Surgeon: Hanley Ben, MD;  Location: [facility];  Service: Urology;  Laterality: N/A;   Social History   Occupational History  . Occupation: Environmental manager  Tobacco Use  . Smoking status: Former Smoker    Packs/day: 1.50    Years: 25.00    Pack years: 37.50    Types: Cigarettes    Quit date: 08/26/2004    Years since quitting: 15.7  . Smokeless tobacco: Former Network engineer  . Vaping Use: Never used  Substance and Sexual Activity  . Alcohol  use: No    Alcohol/week: 0.0 standard drinks    Comment: Quit 2009  . Drug use: Yes    Frequency: 2.0 times per week    Types: Marijuana    Comment: MJ-regular use  10/2013 no longer uses  . Sexual activity: Yes

## 2020-06-13 ENCOUNTER — Ambulatory Visit (INDEPENDENT_AMBULATORY_CARE_PROVIDER_SITE_OTHER): Payer: Medicare Other | Admitting: Ophthalmology

## 2020-06-13 ENCOUNTER — Other Ambulatory Visit: Payer: Self-pay

## 2020-06-13 ENCOUNTER — Encounter (INDEPENDENT_AMBULATORY_CARE_PROVIDER_SITE_OTHER): Payer: Self-pay | Admitting: Ophthalmology

## 2020-06-13 DIAGNOSIS — H353221 Exudative age-related macular degeneration, left eye, with active choroidal neovascularization: Secondary | ICD-10-CM

## 2020-06-13 DIAGNOSIS — H353124 Nonexudative age-related macular degeneration, left eye, advanced atrophic with subfoveal involvement: Secondary | ICD-10-CM | POA: Diagnosis not present

## 2020-06-13 DIAGNOSIS — H353113 Nonexudative age-related macular degeneration, right eye, advanced atrophic without subfoveal involvement: Secondary | ICD-10-CM | POA: Diagnosis not present

## 2020-06-13 MED ORDER — BEVACIZUMAB CHEMO INJECTION 1.25MG/0.05ML SYRINGE FOR KALEIDOSCOPE
1.2500 mg | INTRAVITREAL | Status: AC | PRN
  Administered 2020-06-13: 1.25 mg via INTRAVITREAL

## 2020-06-13 NOTE — Patient Instructions (Signed)
Patient instructed to contact the office promptly for new onset visual acuity declines or distortions 

## 2020-06-13 NOTE — Assessment & Plan Note (Signed)
Subfoveal disciform, now well controlled.  At 6-week interval follow-up.  Will repeat injection today to maintain and extend interval of examination to 8 weeks

## 2020-06-13 NOTE — Assessment & Plan Note (Signed)
Foveal atrophy accounts for the acuity

## 2020-06-13 NOTE — Assessment & Plan Note (Signed)

## 2020-06-13 NOTE — Progress Notes (Signed)
06/13/2020     CHIEF COMPLAINT Patient presents for Retina Follow Up   HISTORY OF PRESENT ILLNESS: [name] is a 69 y.o. male who presents to the clinic today for:   HPI    Retina Follow Up    Patient presents with  Wet AMD.  In left eye.  This started 6 weeks ago.  Severity is mild.  Duration of 6 weeks.  Since onset it is stable.          Comments    6 Week AMD F/U OS, poss Avastin OS  Pt denies noticeable changes to [place] OU since last visit. Pt denies ocular pain, flashes of light, or floaters OU.         Last edited by Rockie Neighbours, [place] on 06/13/2020 10:06 AM. (History)      Referring physician: Tonia Ghent, MD [place],  [place] [address]  HISTORICAL INFORMATION:   Selected notes from the [facility]: No current outpatient medications on file. (Ophthalmic Drugs)   No current facility-administered medications for this visit. (Ophthalmic Drugs)   Current Outpatient Medications (Other)  Medication Sig  . acetaminophen (TYLENOL) 500 MG tablet Take 500 mg by mouth every 6 (six) hours as needed for moderate pain.  . cephALEXin (KEFLEX) 500 MG capsule Take 500 mg by mouth 4 (four) times daily.  . clorazepate (TRANXENE) 3.75 MG tablet Take 3.75 mg by mouth 2 (two) times daily.   Marland Kitchen ibuprofen (ADVIL,MOTRIN) 200 MG tablet Take 400 mg by mouth daily as needed for pain.   Marland Kitchen levothyroxine (SYNTHROID) 25 MCG tablet Take 1 tablet (25 mcg total) by mouth daily before breakfast.  . Multiple Vitamin (MULTIVITAMIN WITH MINERALS) TABS tablet Take 1 tablet by mouth daily.  . Na Sulfate-K Sulfate-Mg Sulf 17.5-3.13-1.6 GM/177ML SOLN Suprep-Use as directed  . pantoprazole (PROTONIX) 40 MG tablet Take 1 tablet (40 mg total) by mouth every morning. Take  30 minutes before breakfast  . sertraline (ZOLOFT) 100 MG tablet Take 1 tablet (100 mg total) by mouth daily.  Alveda Reasons 20 MG TABS tablet TAKE 1 TABLET BY MOUTH ONCE  DAILY.  Marland Kitchen zolpidem (AMBIEN CR) 12.5 MG CR tablet Take 12.5 mg by mouth at bedtime.   No current facility-administered medications for this visit. (Other)      REVIEW OF SYSTEMS:    ALLERGIES Allergies  Allergen Reactions  . Crestor [Rosuvastatin]     Mood changes  . Escitalopram Oxalate     Mood changes, irritablity  . Latex Other (See Comments)    Blood in urine with latex catheter- but this was likely from mechanical irritation, not from the latex itself.    . Sulfonamide Derivatives Rash    PAST MEDICAL HISTORY Past Medical History:  Diagnosis Date  . Alcohol abuse, unspecified    history of  . Anxiety   . Arthritis    all over  . Bladder cancer [facility]) 2011   Dr. Janice Norrie  . CAD (coronary artery disease) 2012   MI  . Depression   . Dyslipidemia   . History of DVT (deep vein thrombosis) 11/08 and again in 2014   previously on coumadin  . Hypertension   . Inguinal hernia without mention of obstruction or gangrene, unilateral or unspecified, (not specified as recurrent)   . Internal hemorrhoids without mention of complication   . Other abnormal glucose   . Personal history of other disorder of  urinary system   . Stroke ([place]) 05/25/2005   TIA  . Thrombocytopenia, unspecified ([place])   . TIA (transient ischemic attack) 2008   Past Surgical History:  Procedure Laterality Date  . CARDIAC CATHETERIZATION  05/2011   mild atherosclerosis  . CYSTOSCOPY  07/12/2011   Procedure: CYSTOSCOPY;  Surgeon: Hanley Ben, MD;  [facility];  Service: Urology;  Laterality: N/A;  one hour being requested for this case  . HERNIA REPAIR  8/11   repair of right indirect and direct hernias; incarerated umbilical hernia containing preperitoneal fat  . SHOULDER SURGERY  03/08/08   Left; arthroscopy  . TONSILLECTOMY     as child  . TRANSURETHRAL RESECTION OF BLADDER TUMOR  07/12/2011   Procedure: TRANSURETHRAL RESECTION OF BLADDER TUMOR (TURBT);  Surgeon: Hanley Ben, MD;   Location[facility];  Service: Urology;  Laterality: N/A;    FAMILY HISTORY Family History  Problem Relation Age of Onset  . Other Father        Hypotension from medication causing GI bleed  . Heart failure Father   . Emphysema Mother   . Prostate cancer Neg Hx     SOCIAL HISTORY Social History   Tobacco Use  . Smoking status: Former Smoker    Packs/day: 1.50    Years: 25.00    Pack years: 37.50    Types: Cigarettes    Quit date: 08/26/2004    Years since quitting: 15.8  . Smokeless tobacco: Former Network engineer  . Vaping Use: Never used  Substance Use Topics  . Alcohol use: No    Alcohol/week: 0.0 standard drinks    Comment: Quit 2009  . Drug use: Yes    Frequency: 2.0 times per week    Types: Marijuana    Comment: MJ-regular use  10/2013 no longer uses         OPHTHALMIC EXAM:  Base Eye Exam    Visual Acuity (ETDRS)      Right Left   Dist [place] 20/50 +1 20/100   Dist ph [place] 20/30 20/100 +1       Tonometry (Tonopen, 10:08 AM)      Right Left   Pressure 18 22       Pupils      Pupils Dark Light Shape React APD   Right PERRL 4 3 Round Brisk None   Left PERRL 4 3 Round Brisk None       Visual Fields (Counting fingers)      Left Right    Full Full       Extraocular Movement      Right Left    Full Full       Neuro/Psych    Oriented x3: Yes   Mood/Affect: Agitated       Dilation    Left eye: 1.0% Mydriacyl, 2.5% Phenylephrine @ 10:11 AM        Slit Lamp and Fundus Exam    External Exam      Right Left   External Normal Normal       Slit Lamp Exam      Right Left   Lids/Lashes Normal Normal   Conjunctiva/Sclera White and quiet White and quiet   Cornea Clear Clear   Anterior Chamber Deep and quiet Deep and quiet   Iris Round and reactive Round and reactive   Lens 2+ Nuclear sclerosis 2+ Nuclear sclerosis   Anterior Vitreous Normal Normal       Fundus Exam  Right Left   Posterior Vitreous  Posterior vitreous detachment   Disc   Normal   C/D Ratio  0.5   Macula  Geographic atrophy, Macular thickening, Hard drusen, Disciform scar   Vessels  Normal   Periphery  Normal          IMAGING AND PROCEDURES  Imaging and Procedures for 06/13/20  OCT, Retina - OU - Both Eyes       Right Eye Quality was good. Scan locations included subfoveal. Central Foveal Thickness: 270. Progression has been stable. Findings include retinal drusen , no SRF, abnormal foveal contour, subretinal hyper-reflective material.   Left Eye Quality was good. Scan locations included subfoveal. Central Foveal Thickness: 295. Progression has been stable. Findings include retinal drusen , no SRF, abnormal foveal contour, subretinal hyper-reflective material.   Notes No signs of CNVM, OD, WITH RPE  Abnormalities.  Subfoveal disciform white fibrotic scar clinically OS represented on the examination and on the OCT as an inactive lesion currently with destruction of the outer photoreceptor layers and elements accounting for acuity       Intravitreal Injection, Pharmacologic Agent - OS - Left Eye       Time Out 06/13/2020. 10:58 AM. Confirmed correct patient, procedure, site, and patient consented.   Anesthesia Topical anesthesia was used. Anesthetic medications included [place] 3.5%.   Procedure Preparation included 10% betadine to eyelids, 5% betadine to ocular surface, Ofloxacin . A 30 gauge needle was used.   Injection:  1.25 mg Bevacizumab (AVASTIN) SOLN   NDC: 70360-001-02, Lot: [id number]   Route: Intravitreal, Site: Left Eye, Waste: 0 mg  Post-op Post injection exam found visual acuity of at least counting fingers. The patient tolerated the procedure well. There were no complications. The patient received written and verbal post procedure care education. Post injection medications were not given.                 ASSESSMENT/PLAN:  Advanced nonexudative age-related macular degeneration of left eye with subfoveal  involvement Foveal atrophy accounts for the acuity  Exudative age-related macular degeneration of left eye with active choroidal neovascularization (HCC) Subfoveal disciform, now well controlled.  At 6-week interval follow-up.  Will repeat injection today to maintain and extend interval of examination to 8 weeks  Advanced nonexudative age-related macular degeneration of right eye without subfoveal involvement The nature of age--related macular degeneration was discussed with the patient as well as the distinction between dry and wet types. Checking an Amsler Grid daily with advice to return immediately should a distortion develop, was given to the patient. The patient 's smoking status now and in the past was determined and advice based on the AREDS study was provided regarding the consumption of antioxidant supplements. AREDS 2 vitamin formulation was recommended. Consumption of dark leafy vegetables and fresh fruits of various colors was recommended. Treatment modalities for wet macular degeneration particularly the use of intravitreal injections of anti-blood vessel growth factors was discussed with the patient. Avastin, Lucentis, and Eylea are the available options. On occasion, therapy includes the use of photodynamic therapy and thermal laser. Stressed to the patient do not rub eyes.  Patient was advised to check Amsler Grid daily and return immediately if changes are noted. Instructions on using the grid were given to the patient. All patient questions were answered.      ICD-10-CM   1. Exudative age-related macular degeneration of left eye with active choroidal neovascularization (HCC)  H35.3221 OCT, Retina - OU - Both Eyes  Intravitreal Injection, Pharmacologic Agent - OS - Left Eye    Bevacizumab (AVASTIN) SOLN 1.25 mg  2. Advanced nonexudative age-related macular degeneration of left eye with subfoveal involvement  H35.3124   3. Advanced nonexudative age-related macular degeneration of  right eye without subfoveal involvement  H35.3113     1.  OS, controlled CNVM, some scarring accounts for the acuity in addition to RPE atrophy.  We will repeat injection intravitreal Avastin OS today examination repeated in 8 weeks  2.  3.  Ophthalmic Meds Ordered this visit:  Meds ordered this encounter  Medications  . Bevacizumab (AVASTIN) SOLN 1.25 mg       Return in about 8 weeks (around 08/08/2020) for dilate, OS, AVASTIN OCT.  Patient Instructions  Patient instructed to contact the office promptly for new onset visual acuity declines or distortions    Explained the diagnoses, plan, and follow up with the patient and they expressed understanding.  Patient expressed understanding of the importance of proper follow up care.   Clent Demark Virat Prather M.D. Diseases & Surgery of the Retina and Vitreous [facility] [place] 06/13/20     Abbreviations: M myopia (nearsighted); A astigmatism; H hyperopia (farsighted); P presbyopia; Mrx spectacle prescription;  CTL contact lenses; OD right eye; OS left eye; OU both eyes  XT exotropia; ET esotropia; PEK punctate epithelial keratitis; PEE punctate epithelial erosions; DES dry eye syndrome; MGD meibomian gland dysfunction; ATs artificial tears; PFAT's preservative free artificial tears; [place] nuclear sclerotic cataract; PSC posterior subcapsular cataract; ERM epi-retinal membrane; PVD posterior vitreous detachment; RD retinal detachment; DM diabetes mellitus; DR diabetic retinopathy; NPDR non-proliferative diabetic retinopathy; PDR proliferative diabetic retinopathy; CSME clinically significant macular edema; DME diabetic macular edema; dbh dot blot hemorrhages; CWS cotton wool spot; POAG primary open angle glaucoma; C/D cup-to-disc ratio; HVF humphrey visual field; GVF goldmann visual field; OCT optical coherence tomography; IOP intraocular pressure; BRVO Branch retinal vein occlusion; CRVO central retinal vein occlusion; CRAO central  retinal artery occlusion; BRAO branch retinal artery occlusion; RT retinal tear; SB scleral buckle; PPV pars plana vitrectomy; VH Vitreous hemorrhage; PRP panretinal laser photocoagulation; IVK intravitreal kenalog; VMT vitreomacular traction; MH Macular hole;  NVD neovascularization of the disc; NVE neovascularization elsewhere; AREDS age related eye disease study; ARMD age related macular degeneration; POAG primary open angle glaucoma; EBMD epithelial/anterior basement membrane dystrophy; ACIOL anterior chamber intraocular lens; IOL intraocular lens; PCIOL posterior chamber intraocular lens; Phaco/IOL phacoemulsification with intraocular lens placement; [place] photorefractive keratectomy; LASIK laser assisted in situ keratomileusis; HTN hypertension; DM diabetes mellitus; COPD chronic obstructive pulmonary disease

## 2020-06-21 ENCOUNTER — Other Ambulatory Visit: Payer: Self-pay | Admitting: Adult Health

## 2020-06-21 DIAGNOSIS — G4700 Insomnia, unspecified: Secondary | ICD-10-CM

## 2020-06-21 MED ORDER — ZOLPIDEM TARTRATE ER 12.5 MG PO TBCR: 12.5000 mg | EXTENDED_RELEASE_TABLET | Freq: Every day | ORAL | 0 refills | Status: DC

## 2020-07-04 ENCOUNTER — Ambulatory Visit (INDEPENDENT_AMBULATORY_CARE_PROVIDER_SITE_OTHER): Payer: Medicare Other | Admitting: Adult Health

## 2020-07-04 ENCOUNTER — Other Ambulatory Visit: Payer: Self-pay

## 2020-07-04 ENCOUNTER — Encounter: Payer: Self-pay | Admitting: Adult Health

## 2020-07-04 DIAGNOSIS — F331 Major depressive disorder, recurrent, moderate: Secondary | ICD-10-CM | POA: Diagnosis not present

## 2020-07-04 DIAGNOSIS — F411 Generalized anxiety disorder: Secondary | ICD-10-CM | POA: Diagnosis not present

## 2020-07-04 DIAGNOSIS — G4700 Insomnia, unspecified: Secondary | ICD-10-CM

## 2020-07-04 MED ORDER — ZOLPIDEM TARTRATE ER 12.5 MG PO TBCR: 12.5000 mg | EXTENDED_RELEASE_TABLET | Freq: Every day | ORAL | 2 refills | Status: DC

## 2020-07-04 MED ORDER — CLORAZEPATE DIPOTASSIUM 7.5 MG PO TABS: 7.5000 mg | ORAL_TABLET | Freq: Every day | ORAL | 2 refills | Status: DC

## 2020-07-04 NOTE — Progress Notes (Signed)
[name] [id number] 1951/06/11 69 y.o.  Subjective:   Patient ID:  [name] is a 69 y.o. (DOB 1951/04/17) male.  Chief Complaint: No chief complaint on file.   HPI [name] presents to the office today for follow-up of insomnia, anxiety, and depression.   Describes mood today as "not the best". Pleasant. Denies tearfulness. Mood symptoms - denies depression, anxiety, and irritability. Stating "I'm doing alright". Feels like current medications are working for him. Received [employer] Covid-19 vaccination in April. Diagnosed with Pneumonia. Has lost taste and smell over since the Pnuemonia. Working with Optamologist - macular degeneration. Difficulties tuning pianos with tinnitus. Stating "I'm on the verge of retiring". Stable interest and motivation. Taking medications as prescribed.  Energy levels stable. Active, has a regular exercise routine.  Enjoys some usual interests and activities. Single. Lives alone. Staining outside of his house. Mostly staying home.  Appetite adequate. Weight loss 185 from 210 pounds. Sleeps better some nights than others. Averages 5 to 6 hours. Focus and concentration stable. Completing tasks. Managing aspects of household. 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 Denies SI or HI.  Denies AH or VH.  Previous medication trials: Zoloft, Ambien, Tranxene   PHQ2-9     Clinical Support from 12/24/2019 in [place] at [facility] Total Score 0  PHQ-9 Total Score 0       Review of Systems:  Review of Systems  Musculoskeletal: Negative for gait problem.  Neurological: Negative for tremors.  Psychiatric/Behavioral:       Please refer to HPI    Medications: I have reviewed the patient's current medications.  Current Outpatient Medications  Medication Sig Dispense Refill  . acetaminophen (TYLENOL) 500 MG tablet Take 500 mg by mouth every 6 (six) hours as needed for moderate pain.    . cephALEXin (KEFLEX) 500 MG capsule  Take 500 mg by mouth 4 (four) times daily.    . clorazepate (TRANXENE) 7.5 MG tablet Take 1 tablet (7.5 mg total) by mouth daily. 30 tablet 2  . ibuprofen (ADVIL,MOTRIN) 200 MG tablet Take 400 mg by mouth daily as needed for pain.     Marland Kitchen levothyroxine (SYNTHROID) 25 MCG tablet Take 1 tablet (25 mcg total) by mouth daily before breakfast. 90 tablet 1  . Multiple Vitamin (MULTIVITAMIN WITH MINERALS) TABS tablet Take 1 tablet by mouth daily.    . Na Sulfate-K Sulfate-Mg Sulf 17.5-3.13-1.6 GM/177ML SOLN Suprep-Use as directed 354 mL 0  . pantoprazole (PROTONIX) 40 MG tablet Take 1 tablet (40 mg total) by mouth every morning. Take  30 minutes before breakfast 30 tablet 11  . sertraline (ZOLOFT) 100 MG tablet Take 1 tablet (100 mg total) by mouth daily.    Alveda Reasons 20 MG TABS tablet TAKE 1 TABLET BY MOUTH ONCE DAILY. 90 tablet 0  . zolpidem (AMBIEN CR) 12.5 MG CR tablet Take 1 tablet (12.5 mg total) by mouth at bedtime. 30 tablet 2   No current facility-administered medications for this visit.    Medication Side Effects: None  Allergies:  Allergies  Allergen Reactions  . Crestor [Rosuvastatin]     Mood changes  . Escitalopram Oxalate     Mood changes, irritablity  . Latex Other (See Comments)    Blood in urine with latex catheter- but this was likely from mechanical irritation, not from the latex itself.    . Sulfonamide Derivatives Rash    Past Medical History:  Diagnosis Date  . Alcohol abuse, unspecified  history of  . Anxiety   . Arthritis    all over  . Bladder cancer [facility]) 2011   Dr. Janice Norrie  . CAD (coronary artery disease) 2012   MI  . Depression   . Dyslipidemia   . History of DVT (deep vein thrombosis) 11/08 and again in 2014   previously on coumadin  . Hypertension   . Inguinal hernia without mention of obstruction or gangrene, unilateral or unspecified, (not specified as recurrent)   . Internal hemorrhoids without mention of complication   . Other abnormal glucose    . Personal history of other disorder of urinary system   . Stroke ([place]) 05/25/2005   TIA  . Thrombocytopenia, unspecified ([place])   . TIA (transient ischemic attack) 2008    Family History  Problem Relation Age of Onset  . Other Father        Hypotension from medication causing GI bleed  . Heart failure Father   . Emphysema Mother   . Prostate cancer Neg Hx     Social History   Socioeconomic History  . Marital status: Single    Spouse name: Not on file  . Number of children: Not on file  . Years of education: Not on file  . Highest education level: Not on file  Occupational History  . Occupation: Environmental manager  Tobacco Use  . Smoking status: Former Smoker    Packs/day: 1.50    Years: 25.00    Pack years: 37.50    Types: Cigarettes    Quit date: 08/26/2004    Years since quitting: 15.8  . Smokeless tobacco: Former Network engineer  . Vaping Use: Never used  Substance and Sexual Activity  . Alcohol use: No    Alcohol/week: 0.0 standard drinks    Comment: Quit 2009  . Drug use: Yes    Frequency: 2.0 times per week    Types: Marijuana    Comment: MJ-regular use  10/2013 no longer uses  . Sexual activity: Yes  Other Topics Concern  . Not on file  Social History Narrative   Environmental manager   Single; 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   Social Determinants of Health   Financial Resource Strain: Low Risk   . Difficulty of Paying Living Expenses: Not hard at all  Food Insecurity: No Food Insecurity  . Worried About Charity fundraiser in the Last Year: Never true  . Ran Out of Food in the Last Year: Never true  Transportation Needs: No Transportation Needs  . Lack of Transportation (Medical): No  . Lack of Transportation (Non-Medical): No  Physical Activity: Sufficiently Active  . Days of Exercise per Week: 7 days  . Minutes of Exercise per Session: 60 min  Stress: No Stress Concern Present  . Feeling of Stress : Not at all  Social Connections:   . Frequency of  Communication with Friends and Family: Not on file  . Frequency of Social Gatherings with Friends and Family: Not on file  . Attends Religious Services: Not on file  . Active Member of Clubs or Organizations: Not on file  . Attends Archivist Meetings: Not on file  . Marital Status: Not on file  Intimate Partner Violence: Not At Risk  . Fear of Current or Ex-Partner: No  . Emotionally Abused: No  . Physically Abused: No  . Sexually Abused: No    Past Medical History, Surgical history, Social history, and Family history were reviewed and updated as appropriate.  Please see review of systems for further details on the patient's review from today.   Objective:   Physical Exam:  There were no vitals taken for this visit.  Physical Exam Constitutional:      General: He is not in acute distress. Musculoskeletal:        General: No deformity.  Neurological:     Mental Status: He is alert and oriented to person, place, and time.     Coordination: Coordination normal.  Psychiatric:        Attention and Perception: Attention and perception normal. He does not perceive auditory or visual hallucinations.        Mood and Affect: Mood normal. Mood is not anxious or depressed. Affect is not labile, blunt, angry or inappropriate.        Speech: Speech normal.        Behavior: Behavior normal.        Thought Content: Thought content normal. Thought content is not paranoid or delusional. Thought content does not include homicidal or suicidal ideation. Thought content does not include homicidal or suicidal plan.        Cognition and Memory: Cognition and memory normal.        Judgment: Judgment normal.     Comments: Insight intact     Lab Review:     Component Value Date/Time   NA 134 (L) 02/25/2020 1254   NA 137 02/18/2019 1549   K 4.0 02/25/2020 1254   CL 104 02/25/2020 1254   CO2 23 02/25/2020 1254   GLUCOSE 96 02/25/2020 1254   BUN 15 02/25/2020 1254   BUN 21  02/18/2019 1549   CREATININE 0.87 02/25/2020 1254   CREATININE 0.88 01/02/2017 0953   CALCIUM 9.0 02/25/2020 1254   PROT 7.5 01/03/2020 0910   ALBUMIN 4.0 01/03/2020 0910   AST 28 01/03/2020 0910   ALT 27 01/03/2020 0910   ALKPHOS 40 01/03/2020 0910   BILITOT 0.8 01/03/2020 0910   GFRNONAA >60 02/25/2020 1254   GFRAA >60 02/25/2020 1254       Component Value Date/Time   WBC 5.9 02/25/2020 1254   RBC 5.28 02/25/2020 1254   HGB 17.2 (H) 02/25/2020 1254   HGB 17.4 02/18/2019 1549   HCT 50.8 02/25/2020 1254   HCT 48.6 02/18/2019 1549   PLT 160 02/25/2020 1254   PLT 170 02/18/2019 1549   MCV 96.2 02/25/2020 1254   MCV 90 02/18/2019 1549   MCH 32.6 02/25/2020 1254   MCHC 33.9 02/25/2020 1254   RDW 13.2 02/25/2020 1254   RDW 12.5 02/18/2019 1549   LYMPHSABS 1.3 01/03/2020 0910   MONOABS 0.5 01/03/2020 0910   EOSABS 0.1 01/03/2020 0910   BASOSABS 0.0 01/03/2020 0910    No results found for: POCLITH, LITHIUM   No results found for: PHENYTOIN, PHENOBARB, VALPROATE, CBMZ   .res Assessment: Plan:    Plan:  PDMP reviewed  1. Zoloft 100mg  daily 2. Add Ambien CR 12.5mg  daily 3. Add Chlorazepate 7.5mg  daily  Read and reviewed note with patient for accuracy.   RTC 3 months  Patient advised to contact office with any questions, adverse effects, or acute worsening in signs and symptoms.  Discussed potential benefits, risk, and side effects of benzodiazepines to include potential risk of tolerance and dependence, as well as possible drowsiness.  Advised patient not to drive if experiencing drowsiness and to take lowest possible effective dose to minimize risk of dependence and tolerance.    Diagnoses and all orders for  this visit:  Insomnia, unspecified type -     zolpidem (AMBIEN CR) 12.5 MG CR tablet; Take 1 tablet (12.5 mg total) by mouth at bedtime.  Major depressive disorder, recurrent episode, moderate (HCC)  Generalized anxiety disorder -     clorazepate  (TRANXENE) 7.5 MG tablet; Take 1 tablet (7.5 mg total) by mouth daily.     Please see After Visit Summary for patient specific instructions.  Future Appointments  Date Time Provider [place]  08/08/2020  9:45 AM Rankin, Clent Demark, MD [facility] None  08/09/2020 10:00 AM Irene Shipper, MD [facility]    No orders of the defined types were placed in this encounter.   -------------------------------

## 2020-07-10 DIAGNOSIS — L570 Actinic keratosis: Secondary | ICD-10-CM | POA: Diagnosis not present

## 2020-07-10 DIAGNOSIS — L821 Other seborrheic keratosis: Secondary | ICD-10-CM | POA: Diagnosis not present

## 2020-07-10 DIAGNOSIS — I8310 Varicose veins of unspecified lower extremity with inflammation: Secondary | ICD-10-CM | POA: Diagnosis not present

## 2020-07-10 DIAGNOSIS — L819 Disorder of pigmentation, unspecified: Secondary | ICD-10-CM | POA: Diagnosis not present

## 2020-07-12 DIAGNOSIS — H2513 Age-related nuclear cataract, bilateral: Secondary | ICD-10-CM | POA: Diagnosis not present

## 2020-07-12 DIAGNOSIS — H40053 Ocular hypertension, bilateral: Secondary | ICD-10-CM | POA: Diagnosis not present

## 2020-07-12 DIAGNOSIS — H0102B Squamous blepharitis left eye, upper and lower eyelids: Secondary | ICD-10-CM | POA: Diagnosis not present

## 2020-07-12 DIAGNOSIS — H0102A Squamous blepharitis right eye, upper and lower eyelids: Secondary | ICD-10-CM | POA: Diagnosis not present

## 2020-08-04 ENCOUNTER — Encounter: Payer: Self-pay | Admitting: Family Medicine

## 2020-08-04 ENCOUNTER — Other Ambulatory Visit: Payer: Self-pay

## 2020-08-04 ENCOUNTER — Ambulatory Visit (INDEPENDENT_AMBULATORY_CARE_PROVIDER_SITE_OTHER)
Admission: RE | Admit: 2020-08-04 | Discharge: 2020-08-04 | Disposition: A | Discharge status: Home / Self Care | Payer: Medicare Other | Source: Ambulatory Visit | Attending: Family Medicine | Admitting: Family Medicine

## 2020-08-04 ENCOUNTER — Ambulatory Visit: Payer: Medicare Other | Admitting: Family Medicine

## 2020-08-04 VITALS — BP 118/70 | HR 64 | Temp 96.9°F | Ht 74.0 in | Wt 197.8 lb

## 2020-08-04 DIAGNOSIS — S6991XA Unspecified injury of right wrist, hand and finger(s), initial encounter: Secondary | ICD-10-CM | POA: Diagnosis not present

## 2020-08-04 DIAGNOSIS — M79646 Pain in unspecified finger(s): Secondary | ICD-10-CM | POA: Diagnosis not present

## 2020-08-04 DIAGNOSIS — M7989 Other specified soft tissue disorders: Secondary | ICD-10-CM | POA: Diagnosis not present

## 2020-08-04 DIAGNOSIS — R109 Unspecified abdominal pain: Secondary | ICD-10-CM

## 2020-08-04 NOTE — Patient Instructions (Addendum)
I don't feel a hernia.  I would use a weight belt in the meantime.  Limit lifting.   Use the splint and we'll update you about the xray.  Take care.  Glad to see you.

## 2020-08-04 NOTE — Progress Notes (Signed)
This visit occurred during the SARS-CoV-2 public health emergency.  Safety protocols were in place, including screening questions prior to the visit, additional usage of staff PPE, and extensive cleaning of exam room while observing appropriate contact time as indicated for disinfecting solutions.  He has seen eye clinic in the meantime.  He has macular degeneration with prev eval by Dr. Zadie Rhine.  He had prev ocular injections.  D/w pt. I will defer to the eye clinic.  Hand pain.  Had been staining and washing his house.  He has a 30 foot ladder that he is using.  R 4th finger got caught in the ladder and he likely pulled a muscle/strained his lower abdomen when he had to catch the ladder from falling.  No abd bruising.  No bulging noted by patient in the abdominal wall.  Lower abd sore at the end of the day.  Event was about 3-4 weeks ago.  He is not using the ladder in the meantime.    Per patient report, his abd wall pain is getting better slowly.    Meds, vitals, and allergies reviewed.   ROS: Per HPI unless specifically indicated in ROS section    nad ncat Neck supple, no lymphadenopathy  rrr ctab abd soft, no rebound, slightly tender to palpation in the lower abdomen without bruising. Extremities well perfused.  Right hand with normal range of motion at all of the MCP and PIP/DIP joints without tendon failure but he is tender near the right fourth PIP.  Distally neurovascular intact. Splint was applied to the fourth finger with relief of discomfort.

## 2020-08-06 DIAGNOSIS — M79646 Pain in unspecified finger(s): Secondary | ICD-10-CM | POA: Insufficient documentation

## 2020-08-06 DIAGNOSIS — R109 Unspecified abdominal pain: Secondary | ICD-10-CM | POA: Insufficient documentation

## 2020-08-06 NOTE — Assessment & Plan Note (Signed)
Imaging reviewed and discussed with patient.  I let him know that I wanted the radiology over read but in the meantime it made sense to use the splint as needed.  See notes on follow-up imaging.  He felt better in the splint and he will update me as needed.

## 2020-08-06 NOTE — Assessment & Plan Note (Signed)
Slowly getting better from likely abdominal wall strain without hernia felt.  Discussed using a weight belt/hernia belt with routine lifting cautions and he will update me as needed.  He agrees to plan.

## 2020-08-08 ENCOUNTER — Encounter (INDEPENDENT_AMBULATORY_CARE_PROVIDER_SITE_OTHER): Payer: Self-pay | Admitting: Ophthalmology

## 2020-08-08 ENCOUNTER — Other Ambulatory Visit: Payer: Self-pay

## 2020-08-08 ENCOUNTER — Ambulatory Visit (INDEPENDENT_AMBULATORY_CARE_PROVIDER_SITE_OTHER): Payer: Medicare Other | Admitting: Ophthalmology

## 2020-08-08 ENCOUNTER — Encounter (INDEPENDENT_AMBULATORY_CARE_PROVIDER_SITE_OTHER): Payer: Medicare Other | Admitting: Ophthalmology

## 2020-08-08 DIAGNOSIS — H2513 Age-related nuclear cataract, bilateral: Secondary | ICD-10-CM | POA: Diagnosis not present

## 2020-08-08 DIAGNOSIS — H4052X3 Glaucoma secondary to other eye disorders, left eye, severe stage: Secondary | ICD-10-CM | POA: Diagnosis not present

## 2020-08-08 DIAGNOSIS — H353221 Exudative age-related macular degeneration, left eye, with active choroidal neovascularization: Secondary | ICD-10-CM

## 2020-08-08 DIAGNOSIS — H353113 Nonexudative age-related macular degeneration, right eye, advanced atrophic without subfoveal involvement: Secondary | ICD-10-CM

## 2020-08-08 DIAGNOSIS — H2512 Age-related nuclear cataract, left eye: Secondary | ICD-10-CM | POA: Insufficient documentation

## 2020-08-08 MED ORDER — BEVACIZUMAB 2.5 MG/0.1ML IZ SOSY
2.5000 mg | PREFILLED_SYRINGE | INTRAVITREAL | Status: AC | PRN
  Administered 2020-08-08: 2.5 mg via INTRAVITREAL

## 2020-08-08 NOTE — Assessment & Plan Note (Signed)
No signs of CNVM today OD on OCT

## 2020-08-08 NOTE — Progress Notes (Signed)
08/08/2020     CHIEF COMPLAINT Patient presents for Retina Follow Up (8 WK FU OS, POSS AVASTIN OS///Pt reports "distortion" in OD (w/Amsler grid use), OS stable, flashes w/ bright light, no pain or pressure, some nausea w/ eyestrain OU. **Pt states that pain gets worse each time he gets an injection, requesting more anesthesia**.)   HISTORY OF PRESENT ILLNESS: [name] is a 69 y.o. male who presents to the clinic today for:   HPI    Retina Follow Up    Patient presents with  Wet AMD.  In left eye.  This started 8 weeks ago.  Duration of 8 weeks. Additional comments: 8 WK FU OS, POSS AVASTIN OS   Pt reports "distortion" in OD (w/Amsler grid use), OS stable, flashes w/ bright light, no pain or pressure, some nausea w/ eyestrain OU. **Pt states that pain gets worse each time he gets an injection, requesting more anesthesia**.          Comments    Patient also notes OD of a distortion in his far peripheral vision not paracentral noted particularly when he looks a checkerboard type patterns in the inferotemporal quadrant of the right eye when he is not looking directly at the item.       Last edited by Hurman Horn, MD on 08/08/2020  3:40 PM. (History)      Referring physician: Tonia Ghent, MD [place],  [place] [address]  HISTORICAL INFORMATION:   Selected notes from the [facility]: No current outpatient medications on file. (Ophthalmic Drugs)   No current facility-administered medications for this visit. (Ophthalmic Drugs)   Current Outpatient Medications (Other)  Medication Sig  . acetaminophen (TYLENOL) 500 MG tablet Take 500 mg by mouth every 6 (six) hours as needed for moderate pain.  . clorazepate (TRANXENE) 7.5 MG tablet Take 1 tablet (7.5 mg total) by mouth daily.  Marland Kitchen ibuprofen (ADVIL,MOTRIN) 200 MG tablet Take 400 mg by mouth daily as needed for pain.   Marland Kitchen levothyroxine (SYNTHROID) 25 MCG tablet  Take 1 tablet (25 mcg total) by mouth daily before breakfast.  . Multiple Vitamin (MULTIVITAMIN WITH MINERALS) TABS tablet Take 1 tablet by mouth daily.  . Na Sulfate-K Sulfate-Mg Sulf 17.5-3.13-1.6 GM/177ML SOLN Suprep-Use as directed  . pantoprazole (PROTONIX) 40 MG tablet Take 1 tablet (40 mg total) by mouth every morning. Take  30 minutes before breakfast  . sertraline (ZOLOFT) 100 MG tablet Take 1 tablet (100 mg total) by mouth daily.  Alveda Reasons 20 MG TABS tablet TAKE 1 TABLET BY MOUTH ONCE DAILY.  Marland Kitchen zolpidem (AMBIEN CR) 12.5 MG CR tablet Take 1 tablet (12.5 mg total) by mouth at bedtime.   No current facility-administered medications for this visit. (Other)      REVIEW OF SYSTEMS:    ALLERGIES Allergies  Allergen Reactions  . Crestor [Rosuvastatin]     Mood changes  . Escitalopram Oxalate     Mood changes, irritablity  . Latex Other (See Comments)    Blood in urine with latex catheter- but this was likely from mechanical irritation, not from the latex itself.    . Sulfonamide Derivatives Rash    PAST MEDICAL HISTORY Past Medical History:  Diagnosis Date  . Alcohol abuse, unspecified    history of  . Anxiety   . Arthritis    all over  . Bladder cancer [facility]) 2011   Dr. Janice Norrie  .  CAD (coronary artery disease) 2012   MI  . Depression   . Dyslipidemia   . History of DVT (deep vein thrombosis) 11/08 and again in 2014   previously on coumadin  . Hypertension   . Inguinal hernia without mention of obstruction or gangrene, unilateral or unspecified, (not specified as recurrent)   . Internal hemorrhoids without mention of complication   . Other abnormal glucose   . Personal history of other disorder of urinary system   . Stroke ([place]) 05/25/2005   TIA  . Thrombocytopenia, unspecified ([place])   . TIA (transient ischemic attack) 2008   Past Surgical History:  Procedure Laterality Date  . CARDIAC CATHETERIZATION  05/2011   mild atherosclerosis  . CYSTOSCOPY  07/12/2011    Procedure: CYSTOSCOPY;  Surgeon: Hanley Ben, MD;  Location: [facility];  Service: Urology;  Laterality: N/A;  one hour being requested for this case  . HERNIA REPAIR  8/11   repair of right indirect and direct hernias; incarerated umbilical hernia containing preperitoneal fat  . SHOULDER SURGERY  03/08/08   Left; arthroscopy  . TONSILLECTOMY     as child  . TRANSURETHRAL RESECTION OF BLADDER TUMOR  07/12/2011   Procedure: TRANSURETHRAL RESECTION OF BLADDER TUMOR (TURBT);  Surgeon: Hanley Ben, MD;  Location: [facility];  Service: Urology;  Laterality: N/A;    FAMILY HISTORY Family History  Problem Relation Age of Onset  . Other Father        Hypotension from medication causing GI bleed  . Heart failure Father   . Emphysema Mother   . Prostate cancer Neg Hx     SOCIAL HISTORY Social History   Tobacco Use  . Smoking status: Former Smoker    Packs/day: 1.50    Years: 25.00    Pack years: 37.50    Types: Cigarettes    Quit date: 08/26/2004    Years since quitting: 15.9  . Smokeless tobacco: Former Network engineer  . Vaping Use: Never used  Substance Use Topics  . Alcohol use: No    Alcohol/week: 0.0 standard drinks    Comment: Quit 2009  . Drug use: Yes    Frequency: 2.0 times per week    Types: Marijuana    Comment: MJ-regular use  10/2013 no longer uses         OPHTHALMIC EXAM:  Base Eye Exam    Visual Acuity (ETDRS)      Right Left   Dist [place] 20/40 +1 20/100 +2   Dist ph [place] 20/30 +1 20/60 -3       Tonometry (Tonopen, 2:48 PM)      Right Left   Pressure 22 27  2  Attempts OU       Pupils      Pupils Dark Light Shape React APD   Right PERRL 4 3 Round Brisk None   Left PERRL 4 3 Round Brisk None       Visual Fields (Counting fingers)      Left Right     Full   Restrictions Partial outer inferior nasal deficiency        Extraocular Movement      Right Left    Full Full       Neuro/Psych    Oriented x3: Yes   Mood/Affect: Agitated        Dilation    Left eye: 1.0% Mydriacyl, 2.5% Phenylephrine @ 2:46 PM        Slit Lamp and Fundus Exam  External Exam      Right Left   External Normal Normal       Slit Lamp Exam      Right Left   Lids/Lashes Normal Normal   Conjunctiva/Sclera White and quiet White and quiet   Cornea Clear Clear   Anterior Chamber Deep and quiet Deep and quiet   Iris Round and reactive Round and reactive   Lens 2+ Nuclear sclerosis 2+ Nuclear sclerosis   Anterior Vitreous Normal Normal       Fundus Exam      Right Left   Posterior Vitreous Posterior vitreous detachment Posterior vitreous detachment   Disc Normal Normal   C/D Ratio  0.5   Macula  attached Geographic atrophy, Macular thickening, Hard drusen, Disciform scar   Vessels  Normal   Periphery  attached, glimpses retina superonasal and quadrant of the "distortion" normal Normal          IMAGING AND PROCEDURES  Imaging and Procedures for 08/08/20  OCT, Retina - OU - Both Eyes       Right Eye Quality was good. Scan locations included subfoveal. Central Foveal Thickness: 284. Findings include subretinal scarring, retinal drusen .   Left Eye Quality was good. Scan locations included subfoveal. Central Foveal Thickness: 297. Progression has been stable. Findings include abnormal foveal contour, subretinal scarring, retinal drusen , no IRF, no SRF.   Notes No intraretinal or subretinal fluid.  There continues to be significant subfoveal scarring atrophy as well as perimacular  Scarring accounting for acuity OS  OD, with accumulated subretinal drusen in the macular region diffuse Bruch's membrane thickening, no signs of CNVM       Intravitreal Injection, Pharmacologic Agent - OS - Left Eye       Time Out 08/08/2020. 3:39 PM. Confirmed correct patient, procedure, site, and patient consented.   Anesthesia Topical anesthesia was used. Anesthetic medications included Akten 3.5%.   Procedure Preparation included 10%  betadine to eyelids, 5% betadine to ocular surface, Ofloxacin . A 30 gauge needle was used.   Injection:  2.5 mg Bevacizumab (AVASTIN) 2.5mg /0.28mL SOSY   NDC: 06301-601-09, Lot: [id number]   Route: Intravitreal, Site: Left Eye  Post-op Post injection exam found visual acuity of at least counting fingers. The patient tolerated the procedure well. There were no complications. The patient received written and verbal post procedure care education. Post injection medications were not given.        Aspiration of Aqueous, Diagnostic - OS - Left Eye       I was still anesthetized, redrop with topical ofloxacin applied and with lid speculum and sterile in place, 0.1 cc of aqueous fluid from the anterior chamber removed to equalize the pressure and to regain vision which had gone dark post injection intravitreal                ASSESSMENT/PLAN:  Advanced nonexudative age-related macular degeneration of right eye without subfoveal involvement No signs of CNVM today OD on OCT  Nuclear sclerotic cataract of both eyes Cataract(s) account for the patient's complaint. I discussed the risks and benefits of cataract surgery. Options were explained to the patient. The patient understands that new glasses may not improve their vision and desires to have cataract surgery. I have recommended follow up with their general eye care doctor for evaluation and consideration of cataract extraction with new intraocular lens insertion.      ICD-10-CM   1. Exudative age-related macular degeneration of left eye with active choroidal  neovascularization (HCC)  H35.3221 OCT, Retina - OU - Both Eyes    Intravitreal Injection, Pharmacologic Agent - OS - Left Eye    bevacizumab (AVASTIN) SOSY 2.5 mg  2. Advanced nonexudative age-related macular degeneration of right eye without subfoveal involvement  H35.3113   3. Nuclear sclerotic cataract of both eyes  H25.13   4. Secondary glaucoma, left, severe stage  H40.52X3  Aspiration of Aqueous, Diagnostic - OS - Left Eye    1.  OS, significant foveal atrophy remains as a consequence of previous subfoveal CNVM and now subretinal scarring With drusenoid atrophy.  Pete intravitreal Avastin OS today at 8-week follow-up interval, will monitor and dilate OU next in 8 weeks    2.  Moderate cataract in each eye, most likely does affect his nighttime vision particularly while driving on a [employer] with oncoming lights darkening the road ahead.  I explained to the patient that maximization of his current visual acuity would do nothing but in prove his functioning and activities of daily life enjoyment while macular degeneration allows him to still function  3.  Post intravitreal injection left eye today, transient elevation intraocular pressure necessitated aqueous therapeutic tap left eye performed without difficulty in recovery of vision promptly.  Vision was dark for approximately 2 and half to 3 minutes prior to the [facility] tap  Ophthalmic Meds Ordered this visit:  Meds ordered this encounter  Medications  . bevacizumab (AVASTIN) SOSY 2.5 mg       Return in about 8 weeks (around 10/03/2020) for DILATE OU, AVASTIN OCT, OS.  There are no Patient Instructions on file for this visit.   Explained the diagnoses, plan, and follow up with the patient and they expressed understanding.  Patient expressed understanding of the importance of proper follow up care.   Clent Demark Kaelynne Christley M.D. Diseases & Surgery of the Retina and Vitreous [facility]ina [facility] Diabetic [place] 08/08/20     Abbreviations: M myopia (nearsighted); A astigmatism; H hyperopia (farsighted); P presbyopia; Mrx spectacle prescription;  CTL contact lenses; OD right eye; OS left eye; OU both eyes  XT exotropia; ET esotropia; PEK punctate epithelial keratitis; PEE punctate epithelial erosions; DES dry eye syndrome; MGD meibomian gland dysfunction; ATs artificial tears; PFAT's preservative free artificial tears; [place]  nuclear sclerotic cataract; PSC posterior subcapsular cataract; ERM epi-retinal membrane; PVD posterior vitreous detachment; RD retinal detachment; DM diabetes mellitus; DR diabetic retinopathy; NPDR non-proliferative diabetic retinopathy; PDR proliferative diabetic retinopathy; CSME clinically significant macular edema; DME diabetic macular edema; dbh dot blot hemorrhages; CWS cotton wool spot; POAG primary open angle glaucoma; C/D cup-to-disc ratio; HVF humphrey visual field; GVF goldmann visual field; OCT optical coherence tomography; IOP intraocular pressure; BRVO Branch retinal vein occlusion; CRVO central retinal vein occlusion; CRAO central retinal artery occlusion; BRAO branch retinal artery occlusion; RT retinal tear; SB scleral buckle; PPV pars plana vitrectomy; VH Vitreous hemorrhage; PRP panretinal laser photocoagulation; IVK intravitreal kenalog; VMT vitreomacular traction; MH Macular hole;  NVD neovascularization of the disc; NVE neovascularization elsewhere; AREDS age related eye disease study; ARMD age related macular degeneration; POAG primary open angle glaucoma; EBMD epithelial/anterior basement membrane dystrophy; ACIOL anterior chamber intraocular lens; IOL intraocular lens; PCIOL posterior chamber intraocular lens; Phaco/IOL phacoemulsification with intraocular lens placement; [place] photorefractive keratectomy; LASIK laser assisted in situ keratomileusis; HTN hypertension; DM diabetes mellitus; COPD chronic obstructive pulmonary disease

## 2020-08-08 NOTE — Assessment & Plan Note (Signed)
Cataract(s) account for the patient's complaint. I discussed the risks and benefits of cataract surgery. Options were explained to the patient. The patient understands that new glasses may not improve their vision and desires to have cataract surgery. I have recommended follow up with their general eye care doctor for evaluation and consideration of cataract extraction with new intraocular lens insertion. 

## 2020-08-09 ENCOUNTER — Encounter: Payer: Self-pay | Admitting: Internal Medicine

## 2020-08-09 ENCOUNTER — Ambulatory Visit (INDEPENDENT_AMBULATORY_CARE_PROVIDER_SITE_OTHER): Payer: Medicare Other | Admitting: Internal Medicine

## 2020-08-09 VITALS — BP 136/70 | HR 63 | Ht 74.0 in | Wt 195.0 lb

## 2020-08-09 DIAGNOSIS — Z7901 Long term (current) use of anticoagulants: Secondary | ICD-10-CM

## 2020-08-09 DIAGNOSIS — S3991XA Unspecified injury of abdomen, initial encounter: Secondary | ICD-10-CM

## 2020-08-09 DIAGNOSIS — K219 Gastro-esophageal reflux disease without esophagitis: Secondary | ICD-10-CM

## 2020-08-09 DIAGNOSIS — Z1211 Encounter for screening for malignant neoplasm of colon: Secondary | ICD-10-CM

## 2020-08-09 NOTE — Patient Instructions (Signed)
If you are age 69 or older, your body mass index should be between 23-30. Your Body mass index is 25.04 kg/m. If this is out of the aforementioned range listed, please consider follow up with your Primary Care Provider.  If you are age 6 or younger, your body mass index should be between 19-25. Your Body mass index is 25.04 kg/m. If this is out of the aformentioned range listed, please consider follow up with your Primary Care Provider.   You have been scheduled for an endoscopy and colonoscopy. Please follow the written instructions given to you at your visit today.  If you use inhalers (even only as needed), please bring them with you on the day of your procedure.

## 2020-08-09 NOTE — Progress Notes (Signed)
HISTORY OF PRESENT ILLNESS:  [name] is a 70 y.o. male with multiple medical problems who presents today regarding GERD, colon cancer screening, the need for colonoscopy and upper endoscopy, abdominal pain secondary to trauma.  Patient was evaluated December 14, 2019 regarding chronic GERD and the need for colon cancer screening.  He is on chronic anticoagulation in the form of Xarelto for remote history of DVT with PE and remote history of TIA.  Previous approval from his cardiologist to hold medication 1 day prior to the procedure.  He subsequently canceled his scheduled colonoscopy and upper endoscopy due to the development of pneumonia.  Had loss of taste and smell at his initial evaluation.  Subsequently seen in the emergency room and at that point was negative for Covid.  He has been vaccinated previously with the [employer] vaccine.  His plans were to reschedule colonoscopy when feeling better.  3 weeks ago he reports falling off of a ladder.  He hurt his right hand.  Hurt his lower abdomen soreness.  Saw his PCP.  No external bruising.  No abdominal films.  Fortunately, his pain is improving with the passage of time.  No new complaints.  He was started on pantoprazole at the time of his last visit.  This has helped his reflux symptoms significantly.  He is also altered his diet and lost about 15 to 20 pounds in weight.  REVIEW OF SYSTEMS:  All non-GI ROS negative as otherwise stated in the HPI except for right hand pain, anxiety, arthritis, visual change, hearing problems  Past Medical History:  Diagnosis Date  . Alcohol abuse, unspecified    history of  . Anxiety   . Arthritis    all over  . Bladder cancer [facility]) 2011   Dr. Janice Norrie  . CAD (coronary artery disease) 2012   MI  . Depression   . Dyslipidemia   . History of DVT (deep vein thrombosis) 11/08 and again in 2014   previously on coumadin  . Hypertension   . Inguinal hernia without mention of obstruction or gangrene,  unilateral or unspecified, (not specified as recurrent)   . Internal hemorrhoids without mention of complication   . Other abnormal glucose   . Personal history of other disorder of urinary system   . Stroke ([place]) 05/25/2005   TIA  . Thrombocytopenia, unspecified ([place])   . TIA (transient ischemic attack) 2008    Past Surgical History:  Procedure Laterality Date  . CARDIAC CATHETERIZATION  05/2011   mild atherosclerosis  . CYSTOSCOPY  07/12/2011   Procedure: CYSTOSCOPY;  Surgeon: Hanley Ben, MD;  Location: [facility];  Service: Urology;  Laterality: N/A;  one hour being requested for this case  . EYE SURGERY Left    injection from wet AMD (age related macular degeneration)  . HERNIA REPAIR  8/11   repair of right indirect and direct hernias; incarerated umbilical hernia containing preperitoneal fat  . SHOULDER SURGERY  03/08/08   Left; arthroscopy  . TONSILLECTOMY     as child  . TRANSURETHRAL RESECTION OF BLADDER TUMOR  07/12/2011   Procedure: TRANSURETHRAL RESECTION OF BLADDER TUMOR (TURBT);  Surgeon: Hanley Ben, MD;  Location: [facility];  Service: Urology;  Laterality: N/A;    Social History Elzia Hott  reports that he quit smoking about 15 years ago. His smoking use included cigarettes. He has a 37.50 pack-year smoking history. He has quit using smokeless tobacco. He reports current drug use. Frequency: 2.00 times per week. Drug:  Marijuana. He reports that he does not drink alcohol.  family history includes Emphysema in his mother; Heart failure in his father; Other in his father.  Allergies  Allergen Reactions  . Crestor [Rosuvastatin]     Mood changes  . Escitalopram Oxalate     Mood changes, irritablity  . Latex Other (See Comments)    Blood in urine with latex catheter- but this was likely from mechanical irritation, not from the latex itself.    . Sulfonamide Derivatives Rash       PHYSICAL EXAMINATION: Vital signs: BP 136/70   Pulse 63   Ht 6\' 2"  (1.88  m)   Wt 195 lb (88.5 kg)   BMI 25.04 kg/m   Constitutional: generally well-appearing, no acute distress Psychiatric: alert and oriented x3, cooperative Eyes: extraocular movements intact, anicteric, conjunctiva pink Mouth: oral pharynx moist, no lesions Neck: supple no lymphadenopathy Cardiovascular: heart regular rate and rhythm, no murmur Lungs: clear to auscultation bilaterally Abdomen: soft, nontender, nondistended, no obvious ascites, no peritoneal signs, normal bowel sounds, no organomegaly Rectal: Deferred to colonoscopy Extremities: no clubbing, cyanosis or lower extremity edema bilaterally Skin: no lesions on visible extremities Neuro: No focal deficits.  Cranial nerves  ASSESSMENT:  1.  Chronic GERD.  Symptoms improved on PPI 2.  Colon cancer screening.  Appropriate candidate without contraindication.  Last examination 2005 was normal. 3.  Abdominal trauma, blunt, after fall.  Improving 4.  Multiple medical problems 5.  Chronic anticoagulation and Xarelto   PLAN:  1.  Reflux precautions 2.  Continue pantoprazole 40 mg daily 3.  Schedule colonoscopy for colon cancer screening.  The patient is high risk given his comorbidities and the need to address anticoagulation.The nature of the procedure, as well as the risks, benefits, and alternatives were carefully and thoroughly reviewed with the patient. Ample time for discussion and questions allowed. The patient understood, was satisfied, and agreed to proceed. 4.  Schedule upper endoscopy in a patient with chronic GERD and risk factors for Barrett's esophagus.  Rule out Barrett's.The nature of the procedure, as well as the risks, benefits, and alternatives were carefully and thoroughly reviewed with the patient. Ample time for discussion and questions allowed. The patient understood, was satisfied, and agreed to proceed. 5.  Hold Xarelto the day prior to the procedure and the day of the procedure as previously approved by his  cardiologist.  We discussed the risks and benefits of this strategy. 6.  Tincture of time regarding his abdominal trauma, as it is improving.

## 2020-08-24 ENCOUNTER — Other Ambulatory Visit: Payer: Self-pay | Admitting: Pharmacist Clinician (PhC)/ Clinical Pharmacy Specialist

## 2020-08-24 DIAGNOSIS — H40053 Ocular hypertension, bilateral: Secondary | ICD-10-CM | POA: Diagnosis not present

## 2020-08-24 MED ORDER — RIVAROXABAN 20 MG PO TABS: 20.0000 mg | ORAL_TABLET | Freq: Every day | ORAL | 1 refills | Status: DC

## 2020-09-01 DIAGNOSIS — M1812 Unilateral primary osteoarthritis of first carpometacarpal joint, left hand: Secondary | ICD-10-CM | POA: Diagnosis not present

## 2020-09-01 DIAGNOSIS — S63635A Sprain of interphalangeal joint of left ring finger, initial encounter: Secondary | ICD-10-CM | POA: Diagnosis not present

## 2020-09-01 DIAGNOSIS — M1811 Unilateral primary osteoarthritis of first carpometacarpal joint, right hand: Secondary | ICD-10-CM | POA: Diagnosis not present

## 2020-09-01 DIAGNOSIS — M180 Bilateral primary osteoarthritis of first carpometacarpal joints: Secondary | ICD-10-CM | POA: Diagnosis not present

## 2020-09-26 ENCOUNTER — Telehealth: Payer: Self-pay | Admitting: Cardiovascular Disease

## 2020-09-26 DIAGNOSIS — I82531 Chronic embolism and thrombosis of right popliteal vein: Secondary | ICD-10-CM

## 2020-09-26 DIAGNOSIS — Z86718 Personal history of other venous thrombosis and embolism: Secondary | ICD-10-CM

## 2020-09-26 NOTE — Telephone Encounter (Signed)
Pt c/o swelling: STAT is pt has developed SOB within 24 hours  1) How much weight have you gained and in what time span? Not sure  2) If swelling, where is the swelling located? Between the knee and ankle   3) Are you currently taking a fluid pill?   4) Are you currently SOB? no  5) Do you have a log of your daily weights (if so, list)? no  6) Have you gained 3 pounds in a day or 5 pounds in a week? 1 poud  7) Have you traveled recently? No   Patient also has a rash on the ankle

## 2020-09-26 NOTE — Telephone Encounter (Signed)
Returned the call to the patient. He stated that since his hammer toe surgery in 2008 that he has had trouble with that leg. From the knee down to the ankle his leg is turning purple with a rash around it. He denies a fever, pain and temperature change.   He went to an Urgent Care in September when a ladder fell on this foot and it became infected. His PCP had prescribed him Keflex which helped with the swelling and rash.   He is currently taking Xarelto 20 mg once daily.   He has been advised that he needs to have the leg seen and should call his PCP for an update since they have been following it or go to an Urgent care.   He was offered an appointment here next week with an APP since he has not been seen since August 2020. He declined an appointment.

## 2020-09-27 NOTE — Telephone Encounter (Signed)
Patient made aware. Orders placed. Message sent to scheduling.

## 2020-09-27 NOTE — Telephone Encounter (Signed)
Even if taking xarelto. It sounds appropriate to check a lower extremity venous Doppler for possible DVT

## 2020-09-28 ENCOUNTER — Inpatient Hospital Stay (HOSPITAL_COMMUNITY): Admission: RE | Admit: 2020-09-28 | Payer: Medicare Other | Source: Ambulatory Visit

## 2020-09-29 ENCOUNTER — Ambulatory Visit (HOSPITAL_COMMUNITY)
Admission: RE | Admit: 2020-09-29 | Discharge: 2020-09-29 | Disposition: A | Discharge status: Home / Self Care | Payer: Medicare Other | Source: Ambulatory Visit | Attending: Internal Medicine | Admitting: Internal Medicine

## 2020-09-29 ENCOUNTER — Encounter: Payer: Self-pay | Admitting: Internal Medicine

## 2020-09-29 ENCOUNTER — Other Ambulatory Visit: Payer: Self-pay

## 2020-09-29 DIAGNOSIS — Z86718 Personal history of other venous thrombosis and embolism: Secondary | ICD-10-CM | POA: Insufficient documentation

## 2020-10-02 ENCOUNTER — Encounter: Payer: Self-pay | Admitting: Family Medicine

## 2020-10-02 ENCOUNTER — Other Ambulatory Visit: Payer: Self-pay

## 2020-10-02 ENCOUNTER — Ambulatory Visit: Payer: Medicare Other | Admitting: Family Medicine

## 2020-10-02 VITALS — BP 120/72 | HR 62 | Temp 97.6°F | Ht 74.0 in | Wt 200.0 lb

## 2020-10-02 DIAGNOSIS — K4090 Unilateral inguinal hernia, without obstruction or gangrene, not specified as recurrent: Secondary | ICD-10-CM

## 2020-10-02 DIAGNOSIS — R21 Rash and other nonspecific skin eruption: Secondary | ICD-10-CM | POA: Diagnosis not present

## 2020-10-02 DIAGNOSIS — H353124 Nonexudative age-related macular degeneration, left eye, advanced atrophic with subfoveal involvement: Secondary | ICD-10-CM | POA: Diagnosis not present

## 2020-10-02 MED ORDER — FLUOCINONIDE 0.05 % EX CREA: 1.0000 "application " | TOPICAL_CREAM | Freq: Two times a day (BID) | CUTANEOUS | 1 refills | Status: DC | PRN

## 2020-10-02 NOTE — Progress Notes (Signed)
This visit occurred during the SARS-CoV-2 public health emergency.  Safety protocols were in place, including screening questions prior to the visit, additional usage of staff PPE, and extensive cleaning of exam room while observing appropriate contact time as indicated for disinfecting solutions.  No new meds.  He has chronic recurrent fluctuating rash on the R anterior shin after prev injury.  It never totally goes away.  TAC ointment helps some, temporarily.  Better if used for a few days in a row but then the sx will still return.    He is still putting up with a L sided hernia.  He can push the mass back in locally, more prominent with straining.  Dr. Hassell Done did prev hernia surgery. Referral placed to general surgery.  Recent u/s done- Old organized thrombus is seen in the veins of the right leg, but there is no evidence of fresh clot.  He has vision changes in the R eye, lateral and inferior.  I asked him to f/u with the eye clinic.    Meds, vitals, and allergies reviewed.   ROS: Per HPI unless specifically indicated in ROS section   nad ncat Neck supple, no LA rrr ctab Tender bulge L groin with a cough. Easily reduced. 5x8cm irritation on the R anterior shin. No ulceration. No streaking erythema.

## 2020-10-02 NOTE — Patient Instructions (Addendum)
We'll call about seeing general surgery.   Stop triamcinolone and try lidex instead.  You may still have to use it intermittently.  Take care.  Glad to see you.

## 2020-10-03 ENCOUNTER — Other Ambulatory Visit: Payer: Self-pay

## 2020-10-03 ENCOUNTER — Ambulatory Visit (INDEPENDENT_AMBULATORY_CARE_PROVIDER_SITE_OTHER): Payer: Medicare Other | Admitting: Ophthalmology

## 2020-10-03 ENCOUNTER — Encounter (INDEPENDENT_AMBULATORY_CARE_PROVIDER_SITE_OTHER): Payer: Self-pay | Admitting: Ophthalmology

## 2020-10-03 DIAGNOSIS — H2513 Age-related nuclear cataract, bilateral: Secondary | ICD-10-CM | POA: Diagnosis not present

## 2020-10-03 DIAGNOSIS — H40003 Preglaucoma, unspecified, bilateral: Secondary | ICD-10-CM | POA: Diagnosis not present

## 2020-10-03 DIAGNOSIS — H353124 Nonexudative age-related macular degeneration, left eye, advanced atrophic with subfoveal involvement: Secondary | ICD-10-CM

## 2020-10-03 DIAGNOSIS — H353221 Exudative age-related macular degeneration, left eye, with active choroidal neovascularization: Secondary | ICD-10-CM | POA: Diagnosis not present

## 2020-10-03 NOTE — Assessment & Plan Note (Signed)
We will asked patient to follow-up with Dr. Delton Prairie prior to his planned cataract extraction and intraocular displacement upcoming in the right eye on October 20, 2020

## 2020-10-03 NOTE — Assessment & Plan Note (Signed)
OS, stable today at 8-week follow-up post Avastin yet elevated intraocular pressure in each eye does suggest there may be no underlying open angle glaucoma.  Possibly worsened by use of intravitreal antivegF OS.  In the phakic state his condition is likely to improve with cataract extraction intraocular displacement nonetheless I would like Dr. Delton Prairie to reevaluate will understand this prior to planned cataract extraction with intraocular lens placement upcoming in the near future

## 2020-10-03 NOTE — Assessment & Plan Note (Signed)
Plan CE IOL OD upcoming soon with Dr. Delton Prairie

## 2020-10-03 NOTE — Progress Notes (Signed)
10/03/2020     CHIEF COMPLAINT Patient presents for Retina Follow Up (60 WK FU OU, POSS AVASTIN OS///Pt reports stable vision OU. Pt denies any new F/F, pain, or pressure OU. )   HISTORY OF PRESENT ILLNESS: [name] is a 70 y.o. male who presents to the clinic today for:   HPI    Retina Follow Up    Patient presents with  Wet AMD.  In left eye.  This started 8 weeks ago.  Duration of 8 weeks. Additional comments: 8 WK FU OU, POSS AVASTIN OS   Pt reports stable vision OU. Pt denies any new F/F, pain, or pressure OU.        Last edited by Nichola Sizer D on 10/03/2020  2:39 PM. (History)      Referring physician: Tonia Ghent, MD [place],  [place] [id number]  HISTORICAL INFORMATION:   Selected notes from the [facility]: No current outpatient medications on file. (Ophthalmic Drugs)   No current facility-administered medications for this visit. (Ophthalmic Drugs)   Current Outpatient Medications (Other)  Medication Sig  . acetaminophen (TYLENOL) 500 MG tablet Take 500 mg by mouth every 6 (six) hours as needed for moderate pain.  . clorazepate (TRANXENE) 7.5 MG tablet Take 1 tablet (7.5 mg total) by mouth daily.  . fluocinonide cream (LIDEX) 3.66 % Apply 1 application topically 2 (two) times daily as needed. On the leg rash  . ibuprofen (ADVIL,MOTRIN) 200 MG tablet Take 400 mg by mouth daily as needed for pain.   Marland Kitchen levothyroxine (SYNTHROID) 25 MCG tablet Take 1 tablet (25 mcg total) by mouth daily before breakfast.  . Multiple Vitamin (MULTIVITAMIN WITH MINERALS) TABS tablet Take 1 tablet by mouth daily.  . Na Sulfate-K Sulfate-Mg Sulf 17.5-3.13-1.6 GM/177ML SOLN Suprep-Use as directed  . pantoprazole (PROTONIX) 40 MG tablet Take 1 tablet (40 mg total) by mouth every morning. Take  30 minutes before breakfast  . rivaroxaban (XARELTO) 20 MG TABS tablet Take 1 tablet (20 mg total) by mouth daily with supper.   . sertraline (ZOLOFT) 100 MG tablet Take 1 tablet (100 mg total) by mouth daily.  Marland Kitchen zolpidem (AMBIEN CR) 12.5 MG CR tablet Take 1 tablet (12.5 mg total) by mouth at bedtime.   No current facility-administered medications for this visit. (Other)      REVIEW OF SYSTEMS:    ALLERGIES Allergies  Allergen Reactions  . Crestor [Rosuvastatin]     Mood changes  . Escitalopram Oxalate     Mood changes, irritablity  . Latex Other (See Comments)    Blood in urine with latex catheter- but this was likely from mechanical irritation, not from the latex itself.    . Sulfonamide Derivatives Rash    PAST MEDICAL HISTORY Past Medical History:  Diagnosis Date  . Alcohol abuse, unspecified    history of  . Anxiety   . Arthritis    all over  . Bladder cancer [facility]) 2011   Dr. Janice Norrie  . CAD (coronary artery disease) 2012   MI  . Depression   . Dyslipidemia   . History of DVT (deep vein thrombosis) 11/08 and again in 2014   previously on coumadin  . Hypertension   . Inguinal hernia without mention of obstruction or gangrene, unilateral or unspecified, (not specified as recurrent)   . Internal hemorrhoids without mention of complication   . Other abnormal glucose   .  Personal history of other disorder of urinary system   . Stroke ([place]) 05/25/2005   TIA  . Thrombocytopenia, unspecified ([place])   . TIA (transient ischemic attack) 2008   Past Surgical History:  Procedure Laterality Date  . CARDIAC CATHETERIZATION  05/2011   mild atherosclerosis  . CYSTOSCOPY  07/12/2011   Procedure: CYSTOSCOPY;  Surgeon: Hanley Ben, MD;  Location: [facility];  Service: Urology;  Laterality: N/A;  one hour being requested for this case  . EYE SURGERY Left    injection from wet AMD (age related macular degeneration)  . HERNIA REPAIR  8/11   repair of right indirect and direct hernias; incarerated umbilical hernia containing preperitoneal fat  . SHOULDER SURGERY  03/08/08   Left; arthroscopy  .  TONSILLECTOMY     as child  . TRANSURETHRAL RESECTION OF BLADDER TUMOR  07/12/2011   Procedure: TRANSURETHRAL RESECTION OF BLADDER TUMOR (TURBT);  Surgeon: Hanley Ben, MD;  Location: [facility];  Service: Urology;  Laterality: N/A;    FAMILY HISTORY Family History  Problem Relation Age of Onset  . Other Father        Hypotension from medication causing GI bleed  . Heart failure Father   . Emphysema Mother   . Prostate cancer Neg Hx     SOCIAL HISTORY Social History   Tobacco Use  . Smoking status: Former Smoker    Packs/day: 1.50    Years: 25.00    Pack years: 37.50    Types: Cigarettes    Quit date: 08/26/2004    Years since quitting: 16.1  . Smokeless tobacco: Former Network engineer  . Vaping Use: Never used  Substance Use Topics  . Alcohol use: No    Alcohol/week: 0.0 standard drinks    Comment: Quit 2009  . Drug use: Yes    Frequency: 2.0 times per week    Types: Marijuana    Comment: MJ-regular use  10/2013 no longer uses         OPHTHALMIC EXAM: Base Eye Exam    Visual Acuity (ETDRS)      Right Left   Dist [place] 20/50 20/100 +1   Dist ph [place] 20/25 -2 20/70 -2       Tonometry (Tonopen, 2:48 PM)      Right Left   Pressure 26 34       Tonometry #2 (Tonopen, 2:49 PM)      Right Left   Pressure  36       Tonometry #3 (Applanation, 3:11 PM)      Right Left   Pressure 26 34       Pupils      Pupils Dark Light Shape React APD   Right PERRL 4 3 Round Brisk None   Left PERRL 4 3 Round Brisk None       Visual Fields (Counting fingers)      Left Right     Full   Restrictions Partial outer inferior nasal deficiency        Extraocular Movement      Right Left    Full Full       Neuro/Psych    Oriented x3: Yes   Mood/Affect: Agitated        Slit Lamp and Fundus Exam    External Exam      Right Left   External Normal Normal       Slit Lamp Exam      Right Left   Lids/Lashes Normal Normal  Conjunctiva/Sclera White and quiet White and  quiet   Cornea Clear Clear   Anterior Chamber Deep and quiet, by Rito Ehrlich Deep and quiet, Rito Ehrlich   Iris Round and reactive Round and reactive   Lens 2+ Nuclear sclerosis 2+ Nuclear sclerosis   Anterior Vitreous Normal Normal          IMAGING AND PROCEDURES  Imaging and Procedures for 10/03/20  OCT, Retina - OU - Both Eyes       Right Eye Quality was good. Scan locations included subfoveal. Central Foveal Thickness: 0.31. Progression has been stable. Findings include no IRF, no SRF, subretinal hyper-reflective material.   Left Eye Quality was good. Scan locations included subfoveal. Central Foveal Thickness: 291. Progression has been stable. Findings include abnormal foveal contour, central retinal atrophy, outer retinal atrophy, no IRF, no SRF, subretinal scarring, disciform scar.   Notes OD with subretinal hyper reflective material, yet no active intraretinal fluid nor CME.  OS with foveal atrophy secondary to resolution of CNVM with residual scarring from disciform scar and subretinal Hyper reflective material                  ASSESSMENT/PLAN:  Advanced nonexudative age-related macular degeneration of left eye with subfoveal involvement Accounts for acuity OS in addition to active scar  Exudative age-related macular degeneration of left eye with active choroidal neovascularization (HCC) OS, stable today at 8-week follow-up post Avastin yet elevated intraocular pressure in each eye does suggest there may be no underlying open angle glaucoma.  Possibly worsened by use of intravitreal antivegF OS.  In the phakic state his condition is likely to improve with cataract extraction intraocular displacement nonetheless I would like Dr. Delton Prairie to reevaluate will understand this prior to planned cataract extraction with intraocular lens placement upcoming in the near future  Glaucoma suspect of both eyes We will asked patient to follow-up with Dr. Delton Prairie  prior to his planned cataract extraction and intraocular displacement upcoming in the right eye on October 20, 2020  Nuclear sclerotic cataract of both eyes Plan CE IOL OD upcoming soon with Dr. Delton Prairie      ICD-10-CM   1. Exudative age-related macular degeneration of left eye with active choroidal neovascularization (HCC)  H35.3221 OCT, Retina - OU - Both Eyes  2. Advanced nonexudative age-related macular degeneration of left eye with subfoveal involvement  H35.3124   3. Glaucoma suspect of both eyes  H40.003   4. Nuclear sclerotic cataract of both eyes  H25.13     1.  We will delay planned injection the left eye today of intravitreal Avastin, as scar is quiescent yet edges have been active in the past, currently at 8-week follow-up.  Instructed pressure elevation we will ask him to seek reevaluation with Dr. Delton Prairie in the near future  2.  3.  Ophthalmic Meds Ordered this visit:  No orders of the defined types were placed in this encounter.      Return in about 3 weeks (around 10/24/2020) for AVASTIN OCT, OS, DILATE OU.  There are no Patient Instructions on file for this visit.   Explained the diagnoses, plan, and follow up with the patient and they expressed understanding.  Patient expressed understanding of the importance of proper follow up care.   Clent Demark Tansy Lorek M.D. Diseases & Surgery of the Retina and Vitreous [facility] [place] 10/03/20     Abbreviations: M myopia (nearsighted); A astigmatism; H hyperopia (farsighted); P presbyopia; Mrx spectacle  prescription;  CTL contact lenses; OD right eye; OS left eye; OU both eyes  XT exotropia; ET esotropia; PEK punctate epithelial keratitis; PEE punctate epithelial erosions; DES dry eye syndrome; MGD meibomian gland dysfunction; ATs artificial tears; PFAT's preservative free artificial tears; [place] nuclear sclerotic cataract; PSC posterior subcapsular cataract; ERM epi-retinal membrane; PVD posterior vitreous  detachment; RD retinal detachment; DM diabetes mellitus; DR diabetic retinopathy; NPDR non-proliferative diabetic retinopathy; PDR proliferative diabetic retinopathy; CSME clinically significant macular edema; DME diabetic macular edema; dbh dot blot hemorrhages; CWS cotton wool spot; POAG primary open angle glaucoma; C/D cup-to-disc ratio; HVF humphrey visual field; GVF goldmann visual field; OCT optical coherence tomography; IOP intraocular pressure; BRVO Branch retinal vein occlusion; CRVO central retinal vein occlusion; CRAO central retinal artery occlusion; BRAO branch retinal artery occlusion; RT retinal tear; SB scleral buckle; PPV pars plana vitrectomy; VH Vitreous hemorrhage; PRP panretinal laser photocoagulation; IVK intravitreal kenalog; VMT vitreomacular traction; MH Macular hole;  NVD neovascularization of the disc; NVE neovascularization elsewhere; AREDS age related eye disease study; ARMD age related macular degeneration; POAG primary open angle glaucoma; EBMD epithelial/anterior basement membrane dystrophy; ACIOL anterior chamber intraocular lens; IOL intraocular lens; PCIOL posterior chamber intraocular lens; Phaco/IOL phacoemulsification with intraocular lens placement; [place] photorefractive keratectomy; LASIK laser assisted in situ keratomileusis; HTN hypertension; DM diabetes mellitus; COPD chronic obstructive pulmonary disease

## 2020-10-03 NOTE — Assessment & Plan Note (Signed)
Accounts for acuity OS in addition to active scar

## 2020-10-04 ENCOUNTER — Other Ambulatory Visit: Payer: Self-pay

## 2020-10-04 ENCOUNTER — Telehealth (INDEPENDENT_AMBULATORY_CARE_PROVIDER_SITE_OTHER): Payer: Medicare Other | Admitting: Adult Health

## 2020-10-04 ENCOUNTER — Encounter: Payer: Self-pay | Admitting: Adult Health

## 2020-10-04 DIAGNOSIS — R21 Rash and other nonspecific skin eruption: Secondary | ICD-10-CM | POA: Insufficient documentation

## 2020-10-04 DIAGNOSIS — G4700 Insomnia, unspecified: Secondary | ICD-10-CM | POA: Diagnosis not present

## 2020-10-04 DIAGNOSIS — F331 Major depressive disorder, recurrent, moderate: Secondary | ICD-10-CM

## 2020-10-04 DIAGNOSIS — F411 Generalized anxiety disorder: Secondary | ICD-10-CM | POA: Diagnosis not present

## 2020-10-04 MED ORDER — ZOLPIDEM TARTRATE ER 12.5 MG PO TBCR: 12.5000 mg | EXTENDED_RELEASE_TABLET | Freq: Every day | ORAL | 2 refills | Status: DC

## 2020-10-04 MED ORDER — CLORAZEPATE DIPOTASSIUM 7.5 MG PO TABS: 7.5000 mg | ORAL_TABLET | Freq: Every day | ORAL | 2 refills | Status: DC

## 2020-10-04 NOTE — Assessment & Plan Note (Signed)
I will await ophthalmology input.

## 2020-10-04 NOTE — Assessment & Plan Note (Signed)
This looks like irritation not infection. Stop triamcinolone. Change to Lidex. He will update me as needed. Routine steroid cream cautions given to patient.

## 2020-10-04 NOTE — Assessment & Plan Note (Signed)
Cautions given to patient. Refer to general surgery.

## 2020-10-04 NOTE — Progress Notes (Signed)
[name] [id number] Sep 02, 1950 70 y.o.  Virtual Visit via Telephone Note  I connected with pt on 10/04/20 at  9:40 AM EST by telephone and verified that I am speaking with the correct person using two identifiers.   I discussed the limitations, risks, security and privacy concerns of performing an evaluation and management service by telephone and the availability of in person appointments. I also discussed with the patient that there may be a patient responsible charge related to this service. The patient expressed understanding and agreed to proceed.   I discussed the assessment and treatment plan with the patient. The patient was provided an opportunity to ask questions and all were answered. The patient agreed with the plan and demonstrated an understanding of the instructions.   The patient was advised to call back or seek an in-person evaluation if the symptoms worsen or if the condition fails to improve as anticipated.  I provided 20 minutes of non-face-to-face time during this encounter.  The patient was located at home.  The provider was located at [place].   Aloha Gell, NP   Subjective:   Patient ID:  [name] is a 70 y.o. (DOB [birth year]) male.  Chief Complaint: No chief complaint on file.   HPI [name] presents for follow-up of insomnia, anxiety, and depression.   Describes mood today as "ok". Pleasant. Denies tearfulness. Mood symptoms - denies depression, anxiety, and irritability. Stating "I'm doing 100% better". Feels like medications are working well for him. [place] off a ladder and has a left inguinal hernia. Working with Optamologist - macular degeneration - Glaucoma. Has not been tuning pianos. Feels like current medications are working for him. Stable interest and motivation. Taking medications as prescribed.  Energy levels stable. Active, has a regular exercise routine.  Enjoys some usual interests and activities. Single. Lives  alone. Staining outside of his house. Mostly staying home.  Appetite adequate.Has not regained sense of taste and smell. Weight loss 185 from 210 pounds. Sleeps better some nights than others. Averages 5 to 6 hours. Focus and concentration stable. Completing tasks. Managing aspects of household. 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 Denies SI or HI.  Denies AH or VH.  Previous medication trials: Zoloft, Ambien, Tranxene  Review of Systems:  Review of Systems  Musculoskeletal: Negative for gait problem.  Neurological: Negative for tremors.  Psychiatric/Behavioral:       Please refer to HPI    Medications: I have reviewed the patient's current medications.  Current Outpatient Medications  Medication Sig Dispense Refill  . acetaminophen (TYLENOL) 500 MG tablet Take 500 mg by mouth every 6 (six) hours as needed for moderate pain.    . clorazepate (TRANXENE) 7.5 MG tablet Take 1 tablet (7.5 mg total) by mouth daily. 30 tablet 2  . fluocinonide cream (LIDEX) 8.46 % Apply 1 application topically 2 (two) times daily as needed. On the leg rash 30 g 1  . ibuprofen (ADVIL,MOTRIN) 200 MG tablet Take 400 mg by mouth daily as needed for pain.     Marland Kitchen levothyroxine (SYNTHROID) 25 MCG tablet Take 1 tablet (25 mcg total) by mouth daily before breakfast. 90 tablet 1  . Multiple Vitamin (MULTIVITAMIN WITH MINERALS) TABS tablet Take 1 tablet by mouth daily.    . Na Sulfate-K Sulfate-Mg Sulf 17.5-3.13-1.6 GM/177ML SOLN Suprep-Use as directed 354 mL 0  . pantoprazole (PROTONIX) 40 MG tablet Take 1 tablet (40 mg total) by mouth every morning. Take  30 minutes before breakfast  30 tablet 11  . rivaroxaban (XARELTO) 20 MG TABS tablet Take 1 tablet (20 mg total) by mouth daily with supper. 90 tablet 1  . sertraline (ZOLOFT) 100 MG tablet Take 1 tablet (100 mg total) by mouth daily.    Marland Kitchen zolpidem (AMBIEN CR) 12.5 MG CR tablet Take 1 tablet (12.5 mg total) by mouth at bedtime. 30 tablet 2   No current  facility-administered medications for this visit.    Medication Side Effects: None  Allergies:  Allergies  Allergen Reactions  . Crestor [Rosuvastatin]     Mood changes  . Escitalopram Oxalate     Mood changes, irritablity  . Latex Other (See Comments)    Blood in urine with latex catheter- but this was likely from mechanical irritation, not from the latex itself.    . Sulfonamide Derivatives Rash    Past Medical History:  Diagnosis Date  . Alcohol abuse, unspecified    history of  . Anxiety   . Arthritis    all over  . Bladder cancer [facility]) 2011   Dr. Janice Norrie  . CAD (coronary artery disease) 2012   MI  . Depression   . Dyslipidemia   . History of DVT (deep vein thrombosis) 11/08 and again in 2014   previously on coumadin  . Hypertension   . Inguinal hernia without mention of obstruction or gangrene, unilateral or unspecified, (not specified as recurrent)   . Internal hemorrhoids without mention of complication   . Other abnormal glucose   . Personal history of other disorder of urinary system   . Stroke ([place]) 05/25/2005   TIA  . Thrombocytopenia, unspecified ([place])   . TIA (transient ischemic attack) 2008    Family History  Problem Relation Age of Onset  . Other Father        Hypotension from medication causing GI bleed  . Heart failure Father   . Emphysema Mother   . Prostate cancer Neg Hx     Social History   Socioeconomic History  . Marital status: Single    Spouse name: Not on file  . Number of children: 0  . Years of education: Not on file  . Highest education level: Not on file  Occupational History  . Occupation: Environmental manager  Tobacco Use  . Smoking status: Former Smoker    Packs/day: 1.50    Years: 25.00    Pack years: 37.50    Types: Cigarettes    Quit date: 08/26/2004    Years since quitting: 16.1  . Smokeless tobacco: Former Network engineer  . Vaping Use: Never used  Substance and Sexual Activity  . Alcohol use: No    Alcohol/week: 0.0  standard drinks    Comment: Quit 2009  . Drug use: Yes    Frequency: 2.0 times per week    Types: Marijuana    Comment: MJ-regular use  10/2013 no longer uses  . Sexual activity: Yes  Other Topics Concern  . Not on file  Social History Narrative   Environmental manager   Single; 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   Social Determinants of Health   Financial Resource Strain: Low Risk   . Difficulty of Paying Living Expenses: Not hard at all  Food Insecurity: No Food Insecurity  . Worried About Charity fundraiser in the Last Year: Never true  . Ran Out of Food in the Last Year: Never true  Transportation Needs: No Transportation Needs  . Lack of Transportation (Medical): No  .  Lack of Transportation (Non-Medical): No  Physical Activity: Sufficiently Active  . Days of Exercise per Week: 7 days  . Minutes of Exercise per Session: 60 min  Stress: No Stress Concern Present  . Feeling of Stress : Not at all  Social Connections: Not on file  Intimate Partner Violence: Not At Risk  . Fear of Current or Ex-Partner: No  . Emotionally Abused: No  . Physically Abused: No  . Sexually Abused: No    Past Medical History, Surgical history, Social history, and Family history were reviewed and updated as appropriate.   Please see review of systems for further details on the patient's review from today.   Objective:   Physical Exam:  There were no vitals taken for this visit.  Physical Exam Constitutional:      General: He is not in acute distress. Musculoskeletal:        General: No deformity.  Neurological:     Mental Status: He is alert and oriented to person, place, and time.     Coordination: Coordination normal.  Psychiatric:        Attention and Perception: Attention and perception normal. He does not perceive auditory or visual hallucinations.        Mood and Affect: Mood normal. Mood is not anxious or depressed. Affect is not labile, blunt, angry or inappropriate.        Speech:  Speech normal.        Behavior: Behavior normal.        Thought Content: Thought content normal. Thought content is not paranoid or delusional. Thought content does not include homicidal or suicidal ideation. Thought content does not include homicidal or suicidal plan.        Cognition and Memory: Cognition and memory normal.        Judgment: Judgment normal.     Comments: Insight intact     Lab Review:     Component Value Date/Time   NA 134 (L) 02/25/2020 1254   NA 137 02/18/2019 1549   K 4.0 02/25/2020 1254   CL 104 02/25/2020 1254   CO2 23 02/25/2020 1254   GLUCOSE 96 02/25/2020 1254   BUN 15 02/25/2020 1254   BUN 21 02/18/2019 1549   CREATININE 0.87 02/25/2020 1254   CREATININE 0.88 01/02/2017 0953   CALCIUM 9.0 02/25/2020 1254   PROT 7.5 01/03/2020 0910   ALBUMIN 4.0 01/03/2020 0910   AST 28 01/03/2020 0910   ALT 27 01/03/2020 0910   ALKPHOS 40 01/03/2020 0910   BILITOT 0.8 01/03/2020 0910   GFRNONAA >60 02/25/2020 1254   GFRAA >60 02/25/2020 1254       Component Value Date/Time   WBC 5.9 02/25/2020 1254   RBC 5.28 02/25/2020 1254   HGB 17.2 (H) 02/25/2020 1254   HGB 17.4 02/18/2019 1549   HCT 50.8 02/25/2020 1254   HCT 48.6 02/18/2019 1549   PLT 160 02/25/2020 1254   PLT 170 02/18/2019 1549   MCV 96.2 02/25/2020 1254   MCV 90 02/18/2019 1549   MCH 32.6 02/25/2020 1254   MCHC 33.9 02/25/2020 1254   RDW 13.2 02/25/2020 1254   RDW 12.5 02/18/2019 1549   LYMPHSABS 1.3 01/03/2020 0910   MONOABS 0.5 01/03/2020 0910   EOSABS 0.1 01/03/2020 0910   BASOSABS 0.0 01/03/2020 0910    No results found for: POCLITH, LITHIUM   No results found for: PHENYTOIN, PHENOBARB, VALPROATE, CBMZ   .res Assessment: Plan:    Plan:  PDMP reviewed  1.  Zoloft 100mg  daily - none needed 2. Ambien CR 12.5mg  daily 3. Chlorazepate 7.5mg  daily  RTC 3 months  Patient advised to contact office with any questions, adverse effects, or acute worsening in signs and  symptoms.  Discussed potential benefits, risk, and side effects of benzodiazepines to include potential risk of tolerance and dependence, as well as possible drowsiness.  Advised patient not to drive if experiencing drowsiness and to take lowest possible effective dose to minimize risk of dependence and tolerance.    Diagnoses and all orders for this visit:  Generalized anxiety disorder -     clorazepate (TRANXENE) 7.5 MG tablet; Take 1 tablet (7.5 mg total) by mouth daily.  Major depressive disorder, recurrent episode, moderate (HCC)  Insomnia, unspecified type -     zolpidem (AMBIEN CR) 12.5 MG CR tablet; Take 1 tablet (12.5 mg total) by mouth at bedtime.    Please see After Visit Summary for patient specific instructions.  Future Appointments  Date Time Provider [place]  10/10/2020  9:00 AM Irene Shipper, MD [facility]  10/24/2020  3:15 PM Rankin, Clent Demark, MD [facility] None    No orders of the defined types were placed in this encounter.     -------------------------------

## 2020-10-05 DIAGNOSIS — H40053 Ocular hypertension, bilateral: Secondary | ICD-10-CM | POA: Diagnosis not present

## 2020-10-09 ENCOUNTER — Encounter: Payer: Self-pay | Admitting: *Deleted

## 2020-10-10 ENCOUNTER — Other Ambulatory Visit: Payer: Self-pay

## 2020-10-10 ENCOUNTER — Telehealth: Payer: Self-pay | Admitting: Internal Medicine

## 2020-10-10 ENCOUNTER — Encounter: Payer: Self-pay | Admitting: Internal Medicine

## 2020-10-10 ENCOUNTER — Ambulatory Visit (AMBULATORY_SURGERY_CENTER): Payer: Medicare Other | Admitting: Internal Medicine

## 2020-10-10 VITALS — BP 112/63 | HR 52 | Temp 97.3°F | Resp 19 | Ht 74.0 in | Wt 195.0 lb

## 2020-10-10 DIAGNOSIS — Z1211 Encounter for screening for malignant neoplasm of colon: Secondary | ICD-10-CM | POA: Diagnosis not present

## 2020-10-10 DIAGNOSIS — K219 Gastro-esophageal reflux disease without esophagitis: Secondary | ICD-10-CM

## 2020-10-10 MED ORDER — SODIUM CHLORIDE 0.9 % IV SOLN: 500.0000 mL | Freq: Once | INTRAVENOUS | Status: DC

## 2020-10-10 NOTE — Op Note (Signed)
[facility] Patient Name: [name] Procedure Date: 10/10/2020 9:57 AM MRN: [id number] Endoscopist: Docia Chuck. Henrene Pastor , MD Age: 70 Referring MD:  Date of Birth: 05-03-1951 Gender: Male Account #: [id number] Procedure:                Colonoscopy Indications:              Screening for colorectal malignant neoplasm.                            Negative index exam 2005 Medicines:                Monitored Anesthesia Care Procedure:                Pre-Anesthesia Assessment:                           - Prior to the procedure, a History and Physical                            was performed, and patient medications and                            allergies were reviewed. The patient's tolerance of                            previous anesthesia was also reviewed. The risks                            and benefits of the procedure and the sedation                            options and risks were discussed with the patient.                            All questions were answered, and informed consent                            was obtained. Prior Anticoagulants: The patient has                            taken Xarelto (rivaroxaban), last dose was 1 day                            prior to procedure. ASA Grade Assessment: III - A                            patient with severe systemic disease. After                            reviewing the risks and benefits, the patient was                            deemed in satisfactory condition to undergo the  procedure.                           After obtaining informed consent, the colonoscope                            was passed under direct vision. Throughout the                            procedure, the patient's blood pressure, pulse, and                            oxygen saturations were monitored continuously. The                            Colonoscope was introduced through the anus and                             advanced to the the cecum, identified by                            appendiceal orifice and ileocecal valve. The                            ileocecal valve, appendiceal orifice, and rectum                            were photographed. The quality of the bowel                            preparation was excellent. The colonoscopy was                            performed without difficulty. The patient tolerated                            the procedure well. The bowel preparation used was                            SUPREP via split dose instruction. Scope In: 10:16:33 AM Scope Out: 10:31:35 AM Scope Withdrawal Time: 0 hours 12 minutes 0 seconds  Total Procedure Duration: 0 hours 15 minutes 2 seconds  Findings:                 Multiple diverticula were found in the left colon                            and right colon.                           Internal hemorrhoids were found during                            retroflexion. The hemorrhoids were moderate.  The exam was otherwise without abnormality on                            direct and retroflexion views. Complications:            No immediate complications. Estimated blood loss:                            None. Estimated Blood Loss:     Estimated blood loss: none. Impression:               - Diverticulosis in the left colon and in the right                            colon.                           - Internal hemorrhoids.                           - The examination was otherwise normal on direct                            and retroflexion views.                           - No specimens collected. Recommendation:           - Repeat colonoscopy in 10 years for screening                            purposes.                           - Resume Xarelto (rivaroxaban) today at prior dose.                           - Patient has a contact number available for                            emergencies. The signs and symptoms  of potential                            delayed complications were discussed with the                            patient. Return to normal activities tomorrow.                            Written discharge instructions were provided to the                            patient.                           - Resume previous diet.                           -  Continue present medications. Docia Chuck. Henrene Pastor, MD 10/10/2020 10:35:40 AM This report has been signed electronically.

## 2020-10-10 NOTE — Patient Instructions (Signed)
Discharge instructions given. Handouts on diverticulosis and hemorrhoids. Resume Xarelto today. Resume previous medications. YOU HAD AN ENDOSCOPIC PROCEDURE TODAY AT [facility]:   Refer to the procedure report that was given to you for any specific questions about what was found during the examination.  If the procedure report does not answer your questions, please call your gastroenterologist to clarify.  If you requested that your care partner not be given the details of your procedure findings, then the procedure report has been included in a sealed envelope for you to review at your convenience later.  YOU SHOULD EXPECT: Some feelings of bloating in the abdomen. Passage of more gas than usual.  Walking can help get rid of the air that was put into your GI tract during the procedure and reduce the bloating. If you had a lower endoscopy (such as a colonoscopy or flexible sigmoidoscopy) you may notice spotting of blood in your stool or on the toilet paper. If you underwent a bowel prep for your procedure, you may not have a normal bowel movement for a few days.  Please Note:  You might notice some irritation and congestion in your nose or some drainage.  This is from the oxygen used during your procedure.  There is no need for concern and it should clear up in a day or so.  SYMPTOMS TO REPORT IMMEDIATELY:   Following lower endoscopy (colonoscopy or flexible sigmoidoscopy):  Excessive amounts of blood in the stool  Significant tenderness or worsening of abdominal pains  Swelling of the abdomen that is new, acute  Fever of 100F or higher   Following upper endoscopy (EGD)  Vomiting of blood or coffee ground material  New chest pain or pain under the shoulder blades  Painful or persistently difficult swallowing  New shortness of breath  Fever of 100F or higher  Black, tarry-looking stools  For urgent or emergent issues, a gastroenterologist can be reached at any hour by  calling [phone]. Do not use MyChart messaging for urgent concerns.    DIET:  We do recommend a small meal at first, but then you may proceed to your regular diet.  Drink plenty of fluids but you should avoid alcoholic beverages for 24 hours.  ACTIVITY:  You should plan to take it easy for the rest of today and you should NOT DRIVE or use heavy machinery until tomorrow (because of the sedation medicines used during the test).    FOLLOW UP: Our staff will call the number listed on your records 48-72 hours following your procedure to check on you and address any questions or concerns that you may have regarding the information given to you following your procedure. If we do not reach you, we will leave a message.  We will attempt to reach you two times.  During this call, we will ask if you have developed any symptoms of COVID 19. If you develop any symptoms (ie: fever, flu-like symptoms, shortness of breath, cough etc.) before then, please call [phone].  If you test positive for Covid 19 in the 2 weeks post procedure, please call and report this information to [place].    If any biopsies were taken you will be contacted by phone or by letter within the next 1-3 weeks.  Please call us at [phone] if you have not heard about the biopsies in 3 weeks.    SIGNATURES/CONFIDENTIALITY: You and/or your care partner have signed paperwork which will be entered into your electronic medical  record.  These signatures attest to the fact that that the information above on your After Visit Summary has been reviewed and is understood.  Full responsibility of the confidentiality of this discharge information lies with you and/or your care-partner. 

## 2020-10-10 NOTE — Telephone Encounter (Signed)
Inbound call from patient stating that one of his tooth is chipped and it was not chipped before procedure.

## 2020-10-10 NOTE — Progress Notes (Signed)
pt tolerated well. VSS. awake and to recovery. Report given to RN. Oral bite block removed with ease. No trauma.

## 2020-10-10 NOTE — Op Note (Signed)
[facility] Patient Name: [name] Procedure Date: 10/10/2020 9:53 AM MRN: [id number] Endoscopist: Docia Chuck. Henrene Pastor , MD Age: 70 Referring MD:  Date of Birth: 03-06-1951 Gender: Male Account #: [id number] Procedure:                Upper GI endoscopy Indications:              Abdominal pain, Gastro-esophageal reflux disease,                            Screening for Barrett's esophagus Medicines:                Monitored Anesthesia Care Procedure:                Pre-Anesthesia Assessment:                           - Prior to the procedure, a History and Physical                            was performed, and patient medications and                            allergies were reviewed. The patient's tolerance of                            previous anesthesia was also reviewed. The risks                            and benefits of the procedure and the sedation                            options and risks were discussed with the patient.                            All questions were answered, and informed consent                            was obtained. Prior Anticoagulants: The patient has                            taken Xarelto (rivaroxaban), last dose was 1 day                            prior to procedure. ASA Grade Assessment: III - A                            patient with severe systemic disease. After                            reviewing the risks and benefits, the patient was                            deemed in satisfactory condition to undergo the  procedure.                           After obtaining informed consent, the endoscope was                            passed under direct vision. Throughout the                            procedure, the patient's blood pressure, pulse, and                            oxygen saturations were monitored continuously. The                            Endoscope was introduced through the mouth, and                             advanced to the second part of duodenum. The upper                            GI endoscopy was accomplished without difficulty.                            The patient tolerated the procedure well. Scope In: Scope Out: Findings:                 The esophagus revealed distal esophagitis as                            manifested by erythema and superficial erosions. No                            Barrett's. No obvious stricture.                           The stomach was normal save a sliding hiatal hernia.                           The examined duodenum was normal.                           The cardia and gastric fundus were normal on                            retroflexion. Complications:            No immediate complications. Estimated Blood Loss:     Estimated blood loss: none. Impression:               1. GERD with esophagitis and hiatal hernia.                           2. Otherwise normal EGD. Recommendation:           - Patient has a contact number available for  emergencies. The signs and symptoms of potential                            delayed complications were discussed with the                            patient. Return to normal activities tomorrow.                            Written discharge instructions were provided to the                            patient.                           - Resume previous diet.                           - Continue present medications. Make sure that you                            are taking your pantoprazole 40 mg daily for your                            acid reflux                           - Resume Xarelto (rivaroxaban) at prior dose today.                           - Return to the care of your primary provider Docia Chuck. Henrene Pastor, MD 10/10/2020 10:44:35 AM This report has been signed electronically.

## 2020-10-10 NOTE — Progress Notes (Signed)
1013: Oral bite block placed with ease.

## 2020-10-10 NOTE — Telephone Encounter (Signed)
Pt called back.  He states his left upper top tooth is "chipped.  It was intact when I came in and chipped now.

## 2020-10-10 NOTE — Progress Notes (Signed)
VS by CW  I have reviewed the patient's medical history in detail and updated the computerized patient record.  Pt states he has a left inguinal hernia and is experiencing pain in both the groin and abdomen from it. Is asking to make staff in procedure room aware and to be cautious of positional changes.

## 2020-10-10 NOTE — Telephone Encounter (Signed)
Thank you for the notification Kristen.  Yonis Carreon, my CRNA today was Santiago Glad. Please reach out to the patient on behalf of anesthesia. Thank you. JP

## 2020-10-10 NOTE — Telephone Encounter (Signed)
Continued from previous note.  Pt had an ECL today. Pt states left upper tooth is "chipped, the bottom edge."  He states it is about "2/3rds chipped" He states that "I have terrible teeth from smoking for many years and drinking large amts of coffee." Pt states, " I am not upset, I am just letting you know.  My care today was excellent and couldn't be happier with my care.  I just wanted you to know."  Dr. Henrene Pastor, Langley Gauss and Jenny Reichmann,  Just for your information.  Thanks, [employer]

## 2020-10-11 NOTE — Telephone Encounter (Signed)
Dr. Henrene Pastor,  I spoke to Santiago Glad yesterday concerning this matter.  The pt was well informed about possible dental injury due to his poor dentition, and Santiago Glad an excellent description in her pre-procedure eval.  Thanks,  Osvaldo Angst

## 2020-10-12 ENCOUNTER — Telehealth: Payer: Self-pay | Admitting: *Deleted

## 2020-10-12 DIAGNOSIS — K4090 Unilateral inguinal hernia, without obstruction or gangrene, not specified as recurrent: Secondary | ICD-10-CM | POA: Diagnosis not present

## 2020-10-12 NOTE — Telephone Encounter (Signed)
  Follow up Call-  Call back number 10/10/2020  Post procedure Call Back phone  # [phone]  Permission to leave phone message Yes  Some recent data might be hidden     No answer at number given. Mailbox full, unable to leave a message.

## 2020-10-12 NOTE — Telephone Encounter (Signed)
Attempted to return patient's call.  Voicemail box is full and I was unable to leave a message.

## 2020-10-12 NOTE — Telephone Encounter (Signed)
Patient is returning the call and would like to get a call back regarding his tooth and post procedure care.

## 2020-10-12 NOTE — Telephone Encounter (Signed)
Follow up call made. 

## 2020-10-13 ENCOUNTER — Telehealth: Payer: Self-pay | Admitting: *Deleted

## 2020-10-13 NOTE — Telephone Encounter (Signed)
Anesthesia to address dentition issues with patient

## 2020-10-13 NOTE — Telephone Encounter (Signed)
Called the patient back this morning to follow up on his call he placed on 2/16 to "discuss what to do about my chipped tooth." He states that there was no mention of the possible dental trauma to him prior to the procedure just that they would put the "bite block in and there was a hole that the scope goes through and there was no discussion about the quality of my teeth." He has an appointment with the dentist on [address] to consult on fixing the left upper tooth and the cost. Will notify Osvaldo Angst and Dr. Henrene Pastor.

## 2020-10-13 NOTE — Telephone Encounter (Signed)
Judson Roch,  Attempted to call Mr. Doolittle, no answer left msg and encouraged him to return my call.  Thanks,  Osvaldo Angst

## 2020-10-17 NOTE — Telephone Encounter (Signed)
[name],  After three attempts, two last week,  I was just able to contact [name].  During the conversation he said, "I know I have very poor teeth, many are decayed, my right front is chipped as well as many others.  The left front  was before, but after the procedure now is slightly more chipped, maybe 2 mms more."  We then discussed the consent including the possibility of dental injury, as well as Earnestine Mealing CRNA pre-procedure assessment, documentation of his poor dentition, and her post procedure note indicating no dental trauma had occurred.   The conversation ended with him saying  Earnestine Mealing had taken excellent care of him and he understood he had accepted the dental risk and would not be calling again.  Thanks,  Osvaldo Angst

## 2020-10-17 NOTE — Telephone Encounter (Signed)
Patient calling back to discuss tooth issue.  Can be reached at [phone].

## 2020-10-24 ENCOUNTER — Encounter (INDEPENDENT_AMBULATORY_CARE_PROVIDER_SITE_OTHER): Payer: Self-pay | Admitting: Ophthalmology

## 2020-10-24 ENCOUNTER — Other Ambulatory Visit: Payer: Self-pay

## 2020-10-24 ENCOUNTER — Ambulatory Visit (INDEPENDENT_AMBULATORY_CARE_PROVIDER_SITE_OTHER): Payer: Medicare Other | Admitting: Ophthalmology

## 2020-10-24 DIAGNOSIS — H2513 Age-related nuclear cataract, bilateral: Secondary | ICD-10-CM

## 2020-10-24 DIAGNOSIS — H353113 Nonexudative age-related macular degeneration, right eye, advanced atrophic without subfoveal involvement: Secondary | ICD-10-CM | POA: Diagnosis not present

## 2020-10-24 DIAGNOSIS — H353221 Exudative age-related macular degeneration, left eye, with active choroidal neovascularization: Secondary | ICD-10-CM | POA: Diagnosis not present

## 2020-10-24 DIAGNOSIS — H40003 Preglaucoma, unspecified, bilateral: Secondary | ICD-10-CM | POA: Diagnosis not present

## 2020-10-24 MED ORDER — BEVACIZUMAB 2.5 MG/0.1ML IZ SOSY
2.5000 mg | PREFILLED_SYRINGE | INTRAVITREAL | Status: AC | PRN
  Administered 2020-10-24: 2.5 mg via INTRAVITREAL

## 2020-10-24 NOTE — Assessment & Plan Note (Signed)
Follow-up with Dr. Carolynn Sayers has resulted in commencement of therapy currently using topical therapy in each eye

## 2020-10-24 NOTE — Assessment & Plan Note (Signed)
Patient is scheduled for sequential cataract surgery in the coming weeks

## 2020-10-24 NOTE — Assessment & Plan Note (Signed)
The nature of dry age related macular degeneration was discussed with the patient as well as its possible conversion to wet. The results of the AREDS 2 study was discussed with the patient. A diet rich in dark leafy green vegetables was advised and specific recommendations were made regarding supplements with AREDS 2 formulation . Control of hypertension and serum cholesterol may slow the disease. Smoking cessation is mandatory to slow the disease and diminish the risk of progressing to wet age related macular degeneration. The patient was instructed in the use of an [place] and was told to return immediately for any changes in the Grid. Stressed to the patient do not rub eyes No signs of CNVM

## 2020-10-24 NOTE — Progress Notes (Signed)
10/24/2020     CHIEF COMPLAINT Patient presents for Retina Follow Up (3 Week f\u OU. Possible Avastin OS. OCT/Pt states no changes in vision. Pt has been using gtts as directed. 11 week s/p Avastin inj OS )   HISTORY OF PRESENT ILLNESS: [name] is a 70 y.o. male who presents to the clinic today for:   HPI    Retina Follow Up    Patient presents with  Wet AMD.  In left eye.  Severity is moderate.  Duration of 3 weeks.  Since onset it is stable.  I, the attending physician,  performed the HPI with the patient and updated documentation appropriately. Additional comments: 3 Week f\u OU. Possible Avastin OS. OCT Pt states no changes in vision. Pt has been using gtts as directed. 11 week s/p Avastin inj OS        Last edited by Tilda Franco on 10/24/2020  3:29 PM. (History)      Referring physician: Tonia Ghent, MD [place],  [place] [id number]  HISTORICAL INFORMATION:   Selected notes from the [facility]: No current outpatient medications on file. (Ophthalmic Drugs)   No current facility-administered medications for this visit. (Ophthalmic Drugs)   Current Outpatient Medications (Other)  Medication Sig  . acetaminophen (TYLENOL) 500 MG tablet Take 500 mg by mouth every 6 (six) hours as needed for moderate pain.  . clorazepate (TRANXENE) 7.5 MG tablet Take 1 tablet (7.5 mg total) by mouth daily.  . fluocinonide cream (LIDEX) 1.51 % Apply 1 application topically 2 (two) times daily as needed. On the leg rash  . ibuprofen (ADVIL,MOTRIN) 200 MG tablet Take 400 mg by mouth daily as needed for pain.   Marland Kitchen levothyroxine (SYNTHROID) 25 MCG tablet Take 1 tablet (25 mcg total) by mouth daily before breakfast.  . Multiple Vitamin (MULTIVITAMIN WITH MINERALS) TABS tablet Take 1 tablet by mouth daily.  . pantoprazole (PROTONIX) 40 MG tablet Take 1 tablet (40 mg total) by mouth every morning. Take  30 minutes before breakfast   . rivaroxaban (XARELTO) 20 MG TABS tablet Take 1 tablet (20 mg total) by mouth daily with supper.  . sertraline (ZOLOFT) 100 MG tablet Take 1 tablet (100 mg total) by mouth daily. (Patient not taking: Reported on 10/10/2020)  . zolpidem (AMBIEN CR) 12.5 MG CR tablet Take 1 tablet (12.5 mg total) by mouth at bedtime.   Current Facility-Administered Medications (Other)  Medication Route  . 0.9 %  sodium chloride infusion Intravenous      REVIEW OF SYSTEMS:    ALLERGIES Allergies  Allergen Reactions  . Crestor [Rosuvastatin]     Mood changes  . Escitalopram Oxalate     Mood changes, irritablity  . Latex Other (See Comments)    Blood in urine with latex catheter- but this was likely from mechanical irritation, not from the latex itself.    . Sulfonamide Derivatives Rash    PAST MEDICAL HISTORY Past Medical History:  Diagnosis Date  . Alcohol abuse, unspecified    history of  . Anxiety   . Arthritis    all over  . Bladder cancer [facility]) 2011   Dr. Janice Norrie  . CAD (coronary artery disease) 2012   MI  . Depression   . Dyslipidemia   . History of DVT (deep vein thrombosis) 11/08 and again in 2014   previously on coumadin  . Hypertension   . Inguinal  hernia without mention of obstruction or gangrene, unilateral or unspecified, (not specified as recurrent)   . Internal hemorrhoids without mention of complication   . Other abnormal glucose   . Personal history of other disorder of urinary system   . Stroke ([place]) 05/25/2005   TIA  . Thrombocytopenia, unspecified ([place])   . TIA (transient ischemic attack) 2008   Past Surgical History:  Procedure Laterality Date  . CARDIAC CATHETERIZATION  05/2011   mild atherosclerosis  . CYSTOSCOPY  07/12/2011   Procedure: CYSTOSCOPY;  Surgeon: Hanley Ben, MD;  Location: [facility];  Service: Urology;  Laterality: N/A;  one hour being requested for this case  . EYE SURGERY Left    injection from wet AMD (age related macular degeneration)   . HERNIA REPAIR  8/11   repair of right indirect and direct hernias; incarerated umbilical hernia containing preperitoneal fat  . SHOULDER SURGERY  03/08/08   Left; arthroscopy  . TONSILLECTOMY     as child  . TRANSURETHRAL RESECTION OF BLADDER TUMOR  07/12/2011   Procedure: TRANSURETHRAL RESECTION OF BLADDER TUMOR (TURBT);  Surgeon: Hanley Ben, MD;  Location: [facility];  Service: Urology;  Laterality: N/A;    FAMILY HISTORY Family History  Problem Relation Age of Onset  . Other Father        Hypotension from medication causing GI bleed  . Heart failure Father   . Emphysema Mother   . Prostate cancer Neg Hx     SOCIAL HISTORY Social History   Tobacco Use  . Smoking status: Former Smoker    Packs/day: 1.50    Years: 25.00    Pack years: 37.50    Types: Cigarettes    Quit date: 08/26/2004    Years since quitting: 16.1  . Smokeless tobacco: Former Network engineer  . Vaping Use: Never used  Substance Use Topics  . Alcohol use: No    Alcohol/week: 0.0 standard drinks    Comment: Quit 2009  . Drug use: Yes    Frequency: 2.0 times per week    Types: Marijuana    Comment: MJ-regular use  10/2013 no longer uses         OPHTHALMIC EXAM:  Base Eye Exam    Visual Acuity (Snellen - Linear)      Right Left   Dist [place] 20/50 20/400   Dist ph [place] 20/25 -2 NI       Tonometry (Tonopen, 3:33 PM)      Right Left   Pressure 22 27       Pupils      Pupils Dark Light Shape React APD   Right PERRL 4 3 Round Brisk None   Left PERRL 4 3 Round Brisk None       Neuro/Psych    Oriented x3: Yes   Mood/Affect: Agitated       Dilation    Both eyes: 1.0% Mydriacyl, 2.5% Phenylephrine @ 3:35 PM        Slit Lamp and Fundus Exam    External Exam      Right Left   External Normal Normal       Slit Lamp Exam      Right Left   Lids/Lashes Normal Normal   Conjunctiva/Sclera White and quiet White and quiet   Cornea Clear Clear   Anterior Chamber Deep and quiet, by Rito Ehrlich Deep and quiet, Rito Ehrlich   Iris Round and reactive Round and reactive   Lens 2+ Nuclear sclerosis  2+ Nuclear sclerosis   Anterior Vitreous Normal Normal       Fundus Exam      Right Left   Posterior Vitreous Posterior vitreous detachment Posterior vitreous detachment   Disc Normal Normal   C/D Ratio 0.65 0.5   Macula  attached, para macular geographic atrophy not in the FAZ Geographic atrophy, Macular thickening, Hard drusen, Disciform scar   Vessels Normal Normal   Periphery  attached, Normal          IMAGING AND PROCEDURES  Imaging and Procedures for 10/24/20  OCT, Retina - OU - Both Eyes       Right Eye Quality was good. Scan locations included subfoveal. Central Foveal Thickness: 291. Progression has been stable. Findings include no IRF, no SRF, subretinal hyper-reflective material, outer retinal atrophy, central retinal atrophy.   Left Eye Quality was good. Scan locations included subfoveal. Central Foveal Thickness: 306. Progression has been stable. Findings include abnormal foveal contour, central retinal atrophy, outer retinal atrophy, no IRF, no SRF, subretinal scarring, disciform scar.   Notes OD with subretinal hyper reflective material, yet no active intraretinal fluid nor CME.  OS with foveal atrophy secondary to resolution of CNVM with residual scarring from disciform scar and subretinal Hyper reflective material         Intravitreal Injection, Pharmacologic Agent - OD - Right Eye       Time Out 10/24/2020. 4:27 PM. Confirmed correct patient, procedure, site, and patient consented.   Anesthesia Topical anesthesia was used. Anesthetic medications included Akten 3.5%.   Procedure Preparation included Tobramycin 0.3%, 10% betadine to eyelids, 5% betadine to ocular surface. A 30 gauge needle was used.   Injection:  2.5 mg Bevacizumab (AVASTIN) 2.5mg /0.64mL SOSY   NDC: 75916-384-66, Lot: [id number]   Route: Intravitreal, Site: Right  Eye  Post-op Post injection exam found visual acuity of at least counting fingers. The patient tolerated the procedure well. There were no complications. The patient received written and verbal post procedure care education. Post injection medications were not given.        Intravitreal Injection, Pharmacologic Agent - OS - Left Eye       Time Out 10/24/2020. 4:29 PM. Confirmed correct patient, procedure, site, and patient consented.   Anesthesia Topical anesthesia was used. Anesthetic medications included Akten 3.5%.   Procedure Preparation included 10% betadine to eyelids, 5% betadine to ocular surface, Tobramycin 0.3%. A 30 gauge needle was used.   Injection:  2.5 mg Bevacizumab (AVASTIN) 2.5mg /0.28mL SOSY   NDC: 17793-903-00, Lot: [id number]   Route: Intravitreal, Site: Left Eye  Post-op Post injection exam found visual acuity of at least counting fingers. The patient tolerated the procedure well. There were no complications. The patient received written and verbal post procedure care education. Post injection medications were not given.                 ASSESSMENT/PLAN:  Glaucoma suspect of both eyes Follow-up with Dr. Carolynn Sayers has resulted in commencement of therapy currently using topical therapy in each eye  Nuclear sclerotic cataract of both eyes Patient is scheduled for sequential cataract surgery in the coming weeks  Advanced nonexudative age-related macular degeneration of right eye without subfoveal involvement The nature of dry age related macular degeneration was discussed with the patient as well as its possible conversion to wet. The results of the AREDS 2 study was discussed with the patient. A diet rich in dark leafy green vegetables was advised and specific recommendations were made regarding supplements  with AREDS 2 formulation . Control of hypertension and serum cholesterol may slow the disease. Smoking cessation is mandatory to slow the disease and diminish  the risk of progressing to wet age related macular degeneration. The patient was instructed in the use of an [place] and was told to return immediately for any changes in the Grid. Stressed to the patient do not rub eyes No signs of CNVM      ICD-10-CM   1. Exudative age-related macular degeneration of left eye with active choroidal neovascularization (HCC)  H35.3221 OCT, Retina - OU - Both Eyes    Intravitreal Injection, Pharmacologic Agent - OD - Right Eye    bevacizumab (AVASTIN) SOSY 2.5 mg    Intravitreal Injection, Pharmacologic Agent - OS - Left Eye    bevacizumab (AVASTIN) SOSY 2.5 mg  2. Glaucoma suspect of both eyes  H40.003   3. Nuclear sclerotic cataract of both eyes  H25.13   4. Advanced nonexudative age-related macular degeneration of right eye without subfoveal involvement  H35.3113     1.  Intraocular pressures left eye now much better controlled as well the right eye   intravitreal Avastin carried out only left eye today as there is a clerical error  No injection was carried out in the right eye today  2.  Patient to follow-up dilated funduscopic examination left eye in 3 months  3.  Ophthalmic Meds Ordered this visit:  Meds ordered this encounter  Medications  . bevacizumab (AVASTIN) SOSY 2.5 mg  . bevacizumab (AVASTIN) SOSY 2.5 mg       Return in about 3 months (around 01/24/2021) for DILATE OU, AVASTIN OCT, OS.  There are no Patient Instructions on file for this visit.   Explained the diagnoses, plan, and follow up with the patient and they expressed understanding.  Patient expressed understanding of the importance of proper follow up care.   Clent Demark Kaliegh Willadsen M.D. Diseases & Surgery of the Retina and Vitreous Retina [facility] Diabetic [place] 10/24/20     Abbreviations: M myopia (nearsighted); A astigmatism; H hyperopia (farsighted); P presbyopia; Mrx spectacle prescription;  CTL contact lenses; OD right eye; OS left eye; OU both eyes  XT exotropia; ET  esotropia; PEK punctate epithelial keratitis; PEE punctate epithelial erosions; DES dry eye syndrome; MGD meibomian gland dysfunction; ATs artificial tears; PFAT's preservative free artificial tears; [place] nuclear sclerotic cataract; PSC posterior subcapsular cataract; ERM epi-retinal membrane; PVD posterior vitreous detachment; RD retinal detachment; DM diabetes mellitus; DR diabetic retinopathy; NPDR non-proliferative diabetic retinopathy; PDR proliferative diabetic retinopathy; CSME clinically significant macular edema; DME diabetic macular edema; dbh dot blot hemorrhages; CWS cotton wool spot; POAG primary open angle glaucoma; C/D cup-to-disc ratio; HVF humphrey visual field; GVF goldmann visual field; OCT optical coherence tomography; IOP intraocular pressure; BRVO Branch retinal vein occlusion; CRVO central retinal vein occlusion; CRAO central retinal artery occlusion; BRAO branch retinal artery occlusion; RT retinal tear; SB scleral buckle; PPV pars plana vitrectomy; VH Vitreous hemorrhage; PRP panretinal laser photocoagulation; IVK intravitreal kenalog; VMT vitreomacular traction; MH Macular hole;  NVD neovascularization of the disc; NVE neovascularization elsewhere; AREDS age related eye disease study; ARMD age related macular degeneration; POAG primary open angle glaucoma; EBMD epithelial/anterior basement membrane dystrophy; ACIOL anterior chamber intraocular lens; IOL intraocular lens; PCIOL posterior chamber intraocular lens; Phaco/IOL phacoemulsification with intraocular lens placement; [place] photorefractive keratectomy; LASIK laser assisted in situ keratomileusis; HTN hypertension; DM diabetes mellitus; COPD chronic obstructive pulmonary disease

## 2020-10-31 NOTE — Progress Notes (Signed)
Sent message, via epic in basket, requesting orders in epic from surgeon.  

## 2020-11-01 ENCOUNTER — Encounter (HOSPITAL_COMMUNITY): Payer: Self-pay | Admitting: Emergency Medicine

## 2020-11-01 ENCOUNTER — Ambulatory Visit (HOSPITAL_COMMUNITY)
Admission: EM | Admit: 2020-11-01 | Discharge: 2020-11-01 | Disposition: A | Discharge status: Home / Self Care | Payer: Medicare Other | Attending: Emergency Medicine | Admitting: Emergency Medicine

## 2020-11-01 ENCOUNTER — Telehealth: Payer: Self-pay | Admitting: Internal Medicine

## 2020-11-01 ENCOUNTER — Emergency Department (HOSPITAL_COMMUNITY)
Admission: EM | Admit: 2020-11-01 | Discharge: 2020-11-01 | Disposition: A | Discharge status: Home / Self Care | Payer: Medicare Other | Attending: Emergency Medicine | Admitting: Emergency Medicine

## 2020-11-01 ENCOUNTER — Other Ambulatory Visit: Payer: Self-pay

## 2020-11-01 DIAGNOSIS — I10 Essential (primary) hypertension: Secondary | ICD-10-CM | POA: Diagnosis not present

## 2020-11-01 DIAGNOSIS — Z79899 Other long term (current) drug therapy: Secondary | ICD-10-CM | POA: Diagnosis not present

## 2020-11-01 DIAGNOSIS — Z86718 Personal history of other venous thrombosis and embolism: Secondary | ICD-10-CM | POA: Insufficient documentation

## 2020-11-01 DIAGNOSIS — E039 Hypothyroidism, unspecified: Secondary | ICD-10-CM | POA: Insufficient documentation

## 2020-11-01 DIAGNOSIS — Z91040 Latex allergy status: Secondary | ICD-10-CM | POA: Insufficient documentation

## 2020-11-01 DIAGNOSIS — I2510 Atherosclerotic heart disease of native coronary artery without angina pectoris: Secondary | ICD-10-CM | POA: Diagnosis not present

## 2020-11-01 DIAGNOSIS — Z8673 Personal history of transient ischemic attack (TIA), and cerebral infarction without residual deficits: Secondary | ICD-10-CM | POA: Diagnosis not present

## 2020-11-01 DIAGNOSIS — R002 Palpitations: Secondary | ICD-10-CM | POA: Diagnosis not present

## 2020-11-01 DIAGNOSIS — Z8551 Personal history of malignant neoplasm of bladder: Secondary | ICD-10-CM | POA: Diagnosis not present

## 2020-11-01 DIAGNOSIS — Z87891 Personal history of nicotine dependence: Secondary | ICD-10-CM | POA: Diagnosis not present

## 2020-11-01 DIAGNOSIS — R1013 Epigastric pain: Secondary | ICD-10-CM | POA: Diagnosis not present

## 2020-11-01 DIAGNOSIS — R079 Chest pain, unspecified: Secondary | ICD-10-CM | POA: Diagnosis present

## 2020-11-01 DIAGNOSIS — Z7901 Long term (current) use of anticoagulants: Secondary | ICD-10-CM | POA: Insufficient documentation

## 2020-11-01 LAB — COMPREHENSIVE METABOLIC PANEL
ALT: 42 U/L (ref 0–44)
AST: 34 U/L (ref 15–41)
Albumin: 3.9 g/dL (ref 3.5–5.0)
Alkaline Phosphatase: 33 U/L — ABNORMAL LOW (ref 38–126)
Anion gap: 10 (ref 5–15)
BUN: 21 mg/dL (ref 8–23)
CO2: 23 mmol/L (ref 22–32)
Calcium: 9.3 mg/dL (ref 8.9–10.3)
Chloride: 103 mmol/L (ref 98–111)
Creatinine, Ser: 0.92 mg/dL (ref 0.61–1.24)
GFR, Estimated: >60 mL/min (ref >60)
Glucose, Bld: 113 mg/dL — ABNORMAL HIGH (ref 70–99)
Potassium: 4.1 mmol/L (ref 3.5–5.1)
Sodium: 136 mmol/L (ref 135–145)
Total Bilirubin: 1.4 mg/dL — ABNORMAL HIGH (ref 0.3–1.2)
Total Protein: 7.3 g/dL (ref 6.5–8.1)

## 2020-11-01 LAB — URINALYSIS, ROUTINE W REFLEX MICROSCOPIC
Bilirubin Urine: NEGATIVE
Glucose, UA: NEGATIVE mg/dL
Hgb urine dipstick: NEGATIVE
Ketones, ur: 5 mg/dL — AB
Leukocytes,Ua: NEGATIVE
Nitrite: NEGATIVE
Protein, ur: NEGATIVE mg/dL
Specific Gravity, Urine: 1.023 (ref 1.005–1.030)
pH: 6 (ref 5.0–8.0)

## 2020-11-01 LAB — CBC
HCT: 52.8 % — ABNORMAL HIGH (ref 39.0–52.0)
Hemoglobin: 18.2 g/dL — ABNORMAL HIGH (ref 13.0–17.0)
MCH: 33.1 pg (ref 26.0–34.0)
MCHC: 34.5 g/dL (ref 30.0–36.0)
MCV: 96 fL (ref 80.0–100.0)
Platelets: 184 10*3/uL (ref 150–400)
RBC: 5.5 MIL/uL (ref 4.22–5.81)
RDW: 13.2 % (ref 11.5–15.5)
WBC: 9.4 10*3/uL (ref 4.0–10.5)
nRBC: 0 % (ref 0.0–0.2)

## 2020-11-01 LAB — MAGNESIUM: Magnesium: 2.1 mg/dL (ref 1.7–2.4)

## 2020-11-01 LAB — TROPONIN I (HIGH SENSITIVITY)
Troponin I (High Sensitivity): 9 ng/L (ref <18)
Troponin I (High Sensitivity): 4 ng/L (ref <18)

## 2020-11-01 LAB — TSH: TSH: 1.635 u[IU]/mL (ref 0.350–4.500)

## 2020-11-01 LAB — LIPASE, BLOOD: Lipase: 36 U/L (ref 11–51)

## 2020-11-01 NOTE — ED Triage Notes (Signed)
Patient complains of heart racing, abdominal pain, and tinnitus that started suddenly last night. States he went to urgent care this morning and they measured his heart rate at 140 but decreased before staff was able to perform EKG. Patient only reports headache and tinnitus right now, states he has a history of tinnitus before the intensity of it increased ten fold last night. Patient alert, oriented, and in no apparent distress at this time.

## 2020-11-01 NOTE — ED Notes (Signed)
Provided pt water to help with urine sample. Pt verbalizes understanding of needed sample.

## 2020-11-01 NOTE — ED Provider Notes (Signed)
[facility] EMERGENCY DEPARTMENT Provider Note   CSN: [id number] Arrival date & time: 11/01/20  1029     History Chief Complaint  Patient presents with  . Abdominal Pain    [name] is a 70 y.o. male.  HPI    70 year old with history of CAD, remote history of DVT/PE comes in a chief complaint of epigastric chest pain.  Patient reports that he started having epigastric chest pain last night.  The pain was constant and described as throbbing pain that was nonradiating.  Throughout the night the pain persisted.  Patient started having palpitations, sweats and feeling little short of breath.  He has known history of chronic tinnitus and his tinnitus also got severe.  He went to urgent care but was advised to come to the ER.  They noted that his heart rate was in the 140s, but were unable to capture the rhythm.  Patient reports he was still feeling unwell when he went to the urgent care, but feels great once he arrived to the ER.  He is not having any chest pain at this time.  Patient had upper and lower scope done recently.  Past Medical History:  Diagnosis Date  . Alcohol abuse, unspecified    history of  . Anxiety   . Arthritis    all over  . Bladder cancer [facility]) 2011   Dr. Janice Norrie  . CAD (coronary artery disease) 2012   MI  . Depression   . Dyslipidemia   . History of DVT (deep vein thrombosis) 11/08 and again in 2014   previously on coumadin  . Hypertension   . Inguinal hernia without mention of obstruction or gangrene, unilateral or unspecified, (not specified as recurrent)   . Internal hemorrhoids without mention of complication   . Other abnormal glucose   . Personal history of other disorder of urinary system   . Stroke ([place]) 05/25/2005   TIA  . Thrombocytopenia, unspecified ([place])   . TIA (transient ischemic attack) 2008    Patient Active Problem List   Diagnosis Date Noted  . Rash 10/04/2020  . Glaucoma suspect of both eyes 10/03/2020  .  Nuclear sclerotic cataract of both eyes 08/08/2020  . Pain of finger 08/06/2020  . Abdominal wall pain 08/06/2020  . Exudative age-related macular degeneration of left eye with active choroidal neovascularization ([place]) 03/14/2020  . Advanced nonexudative age-related macular degeneration of right eye without subfoveal involvement 03/14/2020  . Advanced nonexudative age-related macular degeneration of left eye with subfoveal involvement 03/14/2020  . Healthcare maintenance 01/24/2020  . Advance care planning 01/24/2020  . Hypothyroidism 01/24/2020  . Community acquired pneumonia 01/24/2020  . Vasovagal syncope 05/16/2019  . Other social stressor 02/27/2019  . FH: CAD (coronary artery disease) 02/27/2019  . Polycythemia 01/22/2019  . [place] headache 12/25/2018  . Ocular migraine 12/25/2018  . Rhinorrhea 12/25/2018  . Diarrhea 12/25/2018  . Postphlebitic syndrome 03/15/2018  . Long term (current) use of anticoagulants 03/15/2018  . Varicose veins 03/29/2015  . Back pain 11/15/2014  . CAD - minor - 30-40% LAD, 30% RCA 10/12 06/24/2013  . Essential tremor 01/17/2012  . Pure hypercholesterolemia 06/29/2011  . History of bladder cancer 12/24/2010  . TRANSIENT ISCHEMIC ATTACKS, HX OF 05/01/2010  . HEMATURIA, HX OF 05/01/2010  . Thrombocytopenia ([place]) 01/08/2010  . Inguinal hernia without obstruction or gangrene 02/01/2009  . HYPERGLYCEMIA 08/30/2008  . SHOULDER PAIN, LEFT 02/08/2008  . History of DVT (deep vein thrombosis) 08/08/2007  . Acute thromboembolism  of deep veins of lower extremity ([place]) 08/08/2007  . Anxiety state 12/30/2006  . DEPRESSION 12/30/2006    Past Surgical History:  Procedure Laterality Date  . CARDIAC CATHETERIZATION  05/2011   mild atherosclerosis  . CYSTOSCOPY  07/12/2011   Procedure: CYSTOSCOPY;  Surgeon: Hanley Ben, MD;  Location: [facility];  Service: Urology;  Laterality: N/A;  one hour being requested for this case  . EYE SURGERY Left    injection  from wet AMD (age related macular degeneration)  . HERNIA REPAIR  8/11   repair of right indirect and direct hernias; incarerated umbilical hernia containing preperitoneal fat  . SHOULDER SURGERY  03/08/08   Left; arthroscopy  . TONSILLECTOMY     as child  . TRANSURETHRAL RESECTION OF BLADDER TUMOR  07/12/2011   Procedure: TRANSURETHRAL RESECTION OF BLADDER TUMOR (TURBT);  Surgeon: Hanley Ben, MD;  Location: [facility];  Service: Urology;  Laterality: N/A;       Family History  Problem Relation Age of Onset  . Other Father        Hypotension from medication causing GI bleed  . Heart failure Father   . Emphysema Mother   . Prostate cancer Neg Hx     Social History   Tobacco Use  . Smoking status: Former Smoker    Packs/day: 1.50    Years: 25.00    Pack years: 37.50    Types: Cigarettes    Quit date: 08/26/2004    Years since quitting: 16.1  . Smokeless tobacco: Former Network engineer  . Vaping Use: Never used  Substance Use Topics  . Alcohol use: No    Alcohol/week: 0.0 standard drinks    Comment: Quit 2009  . Drug use: Yes    Frequency: 2.0 times per week    Types: Marijuana    Comment: MJ-regular use  10/2013 no longer uses    Home Medications Prior to Admission medications   Medication Sig Start Date End Date Taking? Authorizing Provider  clorazepate (TRANXENE) 7.5 MG tablet Take 1 tablet (7.5 mg total) by mouth daily. 10/04/20  Yes Mozingo, Berdie Ogren, NP  ibuprofen (ADVIL,MOTRIN) 200 MG tablet Take 100-200 mg by mouth daily as needed for mild pain.   Yes [provider]  levothyroxine (SYNTHROID) 25 MCG tablet Take 1 tablet (25 mcg total) by mouth daily before breakfast. 02/04/20  Yes Lamptey, Myrene Galas, MD  Multiple Vitamin (MULTIVITAMIN WITH MINERALS) TABS tablet Take 1 tablet by mouth daily.   Yes [provider]  pantoprazole (PROTONIX) 40 MG tablet Take 1 tablet (40 mg total) by mouth every morning. Take  30 minutes before  breakfast Patient taking differently: Take 40 mg by mouth daily before breakfast. 12/14/19  Yes Esterwood, Amy S, PA-C  rivaroxaban (XARELTO) 20 MG TABS tablet Take 1 tablet (20 mg total) by mouth daily with supper. Patient taking differently: Take 20 mg by mouth daily after breakfast. 08/24/20  Yes Croitoru, Mihai, MD  sertraline (ZOLOFT) 100 MG tablet Take 1 tablet (100 mg total) by mouth daily. Patient taking differently: Take 100 mg by mouth daily as needed (for mood). 12/24/18  Yes Tonia Ghent, MD  SIMBRINZA 1-0.2 % SUSP Place 1 drop into both eyes 3 (three) times daily. 10/14/20  Yes [provider]  zolpidem (AMBIEN CR) 12.5 MG CR tablet Take 1 tablet (12.5 mg total) by mouth at bedtime. Patient taking differently: Take 6.25-12.5 mg by mouth at bedtime. 10/04/20  Yes Mozingo, Berdie Ogren, NP  fluocinonide cream (LIDEX) 1.57 % Apply 1 application topically 2 (two) times daily as needed. On the leg rash Patient not taking: No sig reported 10/02/20   Tonia Ghent, MD  PROLENSA 0.07 % SOLN Place 1 drop into the right eye See admin instructions. 10/24/20   [provider]    Allergies    Crestor [rosuvastatin], Escitalopram oxalate, Latex, and Sulfonamide derivatives  Review of Systems   Review of Systems  Constitutional: Positive for activity change.  Cardiovascular: Positive for chest pain and palpitations.  All other systems reviewed and are negative.   Physical Exam Updated Vital Signs BP 119/71   Pulse (!) 51   Temp 98 F (36.7 C) (Oral)   Resp 19   Ht 6\' 2"  (1.88 m)   Wt 90.7 kg   SpO2 94%   BMI 25.68 kg/m   Physical Exam Vitals and nursing note reviewed.  Constitutional:      Appearance: He is well-developed.  HENT:     Head: Atraumatic.  Cardiovascular:     Rate and Rhythm: Normal rate.  Pulmonary:     Effort: Pulmonary effort is normal.  Abdominal:     Tenderness: There is no abdominal tenderness.  Musculoskeletal:     Cervical back:  Neck supple.  Skin:    General: Skin is warm.  Neurological:     Mental Status: He is alert and oriented to person, place, and time.     ED Results / Procedures / Treatments   Labs (all labs ordered are listed, but only abnormal results are displayed) Labs Reviewed  COMPREHENSIVE METABOLIC PANEL - Abnormal; Notable for the following components:      Result Value   Glucose, Bld 113 (*)    Alkaline Phosphatase 33 (*)    Total Bilirubin 1.4 (*)    All other components within normal limits  CBC - Abnormal; Notable for the following components:   Hemoglobin 18.2 (*)    HCT 52.8 (*)    All other components within normal limits  LIPASE, BLOOD  MAGNESIUM  TSH  URINALYSIS, ROUTINE W REFLEX MICROSCOPIC  TROPONIN I (HIGH SENSITIVITY)  TROPONIN I (HIGH SENSITIVITY)    EKG EKG Interpretation  Date/Time:  Wednesday November 01 2020 10:37:18 EST Ventricular Rate:  68 PR Interval:  150 QRS Duration: 106 QT Interval:  398 QTC Calculation: 423 R Axis:   -67 Text Interpretation: Normal sinus rhythm with sinus arrhythmia Left axis deviation Incomplete right bundle branch block Anterior infarct , age undetermined Abnormal ECG No acute changes No significant change since last tracing Confirmed by Varney Biles [phone]) on 11/01/2020 1:00:23 PM   Radiology No results found.  Procedures Procedures   Medications Ordered in ED Medications - No data to display  ED Course  I have reviewed the triage vital signs and the nursing notes.  Pertinent labs & imaging results that were available during my care of the patient were reviewed by me and considered in my medical decision making (see chart for details).    MDM Rules/Calculators/A&P                          70 year old male comes in a chief complaint of palpitations, epigastric pain, diaphoresis.  Symptoms started last night.  He also has worsening tinnitus.  No associated dizziness.  Neuro exam is nonfocal.  Ocular exam is normal.   Cardiovascular exam is also reassuring.  Abdominal exam is normal.  We considered ACS, dissection,  esophagitis/PUD, pancreatitis, perforated viscus in the differential diagnosis.  For his palpitations, it is possible that he had PSVT versus A. Fib.  Delta troponins ordered and they are negative.  Patient monitored in the ER for brief period of time and there was no evidence of any arrhythmia.  Advised outpatient follow-up for event monitor.  Strict ER return precautions also discussed with the patient and he will return to the ER if he starts having worsening symptoms. Final Clinical Impression(s) / ED Diagnoses Final diagnoses:  Palpitations  Epigastric abdominal pain    Rx / DC Orders ED Discharge Orders    None       Varney Biles, MD 11/01/20 1635

## 2020-11-01 NOTE — Telephone Encounter (Signed)
Attempted to call pt back, no answer. Pt is currently at Urgent care being seen.

## 2020-11-01 NOTE — Discharge Instructions (Addendum)
Saw the ER for palpitations, chest discomfort, sweating, worsening tinnitus.  The work-up in the ER is reassuring.  We did not pick up any cardiac arrhythmia and do not see elevation in your cardiac enzymes.  We recommend that you follow-up with your primary care doctor or cardiologist for evaluation for an event monitor.  It is possible that you had arrhythmia that might have caused you to have chest discomfort, sweats and having palpitations.  Return to the ER immediately if your symptoms recur.

## 2020-11-01 NOTE — Telephone Encounter (Signed)
Pt  Is requesting a call back from a nurse to advice what he should to, pt states he woke up in the middle of the night to vomit and now has a rapid pulse and abdominal pain. Pt is scheduled for hiatal hernia surgery on  3/17.  CB [phone]

## 2020-11-01 NOTE — ED Triage Notes (Signed)
Pt c/o mid-epigastric pain acute onset last night and characterizes it as "feeling like I couldn't swallow", feelings of rapid heart rate/palpitations, pt had 4 episodes of emesis, reports diaphoresis, HA,ringing in ears and some lightheadedness, low energy. Pt reports intermittent episodes of rapid heart rate/palpitations throughout the night along with significant acute fatigue.  Pt states his abdominal pain resolved this morning. Last emesis at approx 0300.   Pt has h/o [place] Spotted Fever, was bitten by a tick this past week  Denies sternal CP, pain to back/arms, or jaw, SOB, cough, sore throat.  While obtaining vital signs, pt's heart rate accelerated and felt subjectively by pt, with rate approx 150. S1S2 irregular rhythm at the time.  EKG performed, left pt connected to EKG in attempt to capture rapid heart rate.  Notified K. Hall Busing of pt status. Per K. Hall Busing, if pt is concerned about heart/rapid rate, headache, pt should go to ED 2/2 limited availability of Urgent Care evaluation resources.  Pt voiced concern about palpitations throughout the night and while present at [facility] and elected to go to Ed.

## 2020-11-01 NOTE — ED Notes (Signed)
Patient is being discharged from the Urgent Care and sent to the Emergency Department via POV . Per K. Hall Busing, NP, patient is in need of higher level of care due to palpitations, diaphoresis, n/v, HA, fatigue. Patient is aware and verbalizes understanding of plan of care.  Vitals:   11/01/20 0949 11/01/20 1009  BP: 134/82   Pulse: (S) (!) 150 72  Resp: 18   Temp: 98.9 F (37.2 C)   SpO2: 95%

## 2020-11-07 NOTE — Progress Notes (Signed)
DUE TO COVID-19 ONLY ONE VISITOR IS ALLOWED TO COME WITH YOU AND STAY IN THE WAITING ROOM ONLY DURING PRE OP AND PROCEDURE DAY OF SURGERY. THE 1 VISITOR  MAY VISIT WITH YOU AFTER SURGERY IN YOUR PRIVATE ROOM DURING VISITING HOURS ONLY!  YOU NEED TO HAVE A COVID 19 TEST ON___3/18/2022 ____ @_______ , THIS TEST MUST BE DONE BEFORE SURGERY,  COVID TESTING SITE [address] [place] [address], IT IS ON THE RIGHT GOING OUT [address] APPROXIMATELY  2 MINUTES PAST [employer] ON THE RIGHT. ONCE YOUR COVID TEST IS COMPLETED,  PLEASE BEGIN THE QUARANTINE INSTRUCTIONS AS OUTLINED IN YOUR HANDOUT.                [name]  11/07/2020   Your procedure is scheduled on:11/14/20    Report to [facility] Main  Entrance   Report to admitting at     [place] AM     Call this number if you have problems the morning of surgery [phone]    REMEMBER: NO  SOLID FOOD CANDY OR GUM AFTER MIDNIGHT. CLEAR LIQUIDS UNTIL  0645am        . NOTHING BY MOUTH EXCEPT CLEAR LIQUIDS UNTIL 0645am    . PLEASE FINISH ENSURE DRINK PER SURGEON ORDER  WHICH NEEDS TO BE COMPLETED AT  0645am    .      CLEAR LIQUID DIET   Foods Allowed                                                                    Coffee and tea, regular and decaf                            Fruit ices (not with fruit pulp)                                      Iced Popsicles                                    Carbonated beverages, regular and diet                                    Cranberry, grape and apple juices Sports drinks like Gatorade Lightly seasoned clear broth or consume(fat free) Sugar, honey syrup ___________________________________________________________________      BRUSH YOUR TEETH MORNING OF SURGERY AND RINSE YOUR MOUTH OUT, NO CHEWING GUM CANDY OR MINTS.     Take these medicines the morning of surgery with A SIP OF WATER: synthroid, protonix, eye drops as usual   DO NOT TAKE ANY DIABETIC  MEDICATIONS DAY OF YOUR SURGERY                               You may not have any metal on your body including hair pins and  piercings  Do not wear jewelry, make-up, lotions, powders or perfumes, deodorant             Do not wear nail polish on your fingernails.  Do not shave  48 hours prior to surgery.              Men may shave face and neck.   Do not bring valuables to the hospital. [place].  Contacts, dentures or bridgework may not be worn into surgery.  Leave suitcase in the car. After surgery it may be brought to your room.     Patients discharged the day of surgery will not be allowed to drive home. IF YOU ARE HAVING SURGERY AND GOING HOME THE SAME DAY, YOU MUST HAVE AN ADULT TO DRIVE YOU HOME AND BE WITH YOU FOR 24 HOURS. YOU MAY GO HOME BY TAXI OR UBER OR ORTHERWISE, BUT AN ADULT MUST ACCOMPANY YOU HOME AND STAY WITH YOU FOR 24 HOURS.  Name and phone number of your driver:  Special Instructions: N/A              Please read over the following fact sheets you were given: _____________________________________________________________________  [facility] - Preparing for Surgery Before surgery, you can play an important role.  Because skin is not sterile, your skin needs to be as free of germs as possible.  You can reduce the number of germs on your skin by washing with CHG (chlorahexidine gluconate) soap before surgery.  CHG is an antiseptic cleaner which kills germs and bonds with the skin to continue killing germs even after washing. Please DO NOT use if you have an allergy to CHG or antibacterial soaps.  If your skin becomes reddened/irritated stop using the CHG and inform your nurse when you arrive at Short Stay. Do not shave (including legs and underarms) for at least 48 hours prior to the first CHG shower.  You may shave your face/neck. Please follow these instructions carefully:  1.  Shower with CHG Soap the night  before surgery and the  morning of Surgery.  2.  If you choose to wash your hair, wash your hair first as usual with your  normal  shampoo.  3.  After you shampoo, rinse your hair and body thoroughly to remove the  shampoo.                           4.  Use CHG as you would any other liquid soap.  You can apply chg directly  to the skin and wash                       Gently with a scrungie or clean washcloth.  5.  Apply the CHG Soap to your body ONLY FROM THE NECK DOWN.   Do not use on face/ open                           Wound or open sores. Avoid contact with eyes, ears mouth and genitals (private parts).                       Wash face,  Genitals (private parts) with your normal soap.             6.  Wash thoroughly, paying special attention to the area where your surgery  will be performed.  7.  Thoroughly rinse your body with warm water from the neck down.  8.  DO NOT shower/wash with your normal soap after using and rinsing off  the CHG Soap.                9.  Pat yourself dry with a clean towel.            10.  Wear clean pajamas.            11.  Place clean sheets on your bed the night of your first shower and do not  sleep with pets. Day of Surgery : Do not apply any lotions/deodorants the morning of surgery.  Please wear clean clothes to the hospital/surgery center.  FAILURE TO FOLLOW THESE INSTRUCTIONS MAY RESULT IN THE CANCELLATION OF YOUR SURGERY PATIENT SIGNATURE_________________________________  NURSE SIGNATURE__________________________________  ________________________________________________________________________

## 2020-11-09 ENCOUNTER — Encounter (HOSPITAL_COMMUNITY)
Admission: RE | Admit: 2020-11-09 | Discharge: 2020-11-09 | Disposition: A | Discharge status: Home / Self Care | Payer: Medicare Other | Source: Ambulatory Visit | Attending: Surgery | Admitting: Surgery

## 2020-11-09 ENCOUNTER — Encounter (HOSPITAL_COMMUNITY): Payer: Self-pay | Admitting: Surgery

## 2020-11-09 ENCOUNTER — Other Ambulatory Visit: Payer: Self-pay

## 2020-11-09 DIAGNOSIS — Z01812 Encounter for preprocedural laboratory examination: Secondary | ICD-10-CM | POA: Diagnosis not present

## 2020-11-09 NOTE — Progress Notes (Addendum)
Anesthesia Review:  PCP: DR Elsie Stain - L[facility] 08/04/2020 Cardiologist : DR Croituri- OOV 04/26/2019 Telemedicine visit  Chest x-ray :02/25/20  EKG :11/02/2020  Echo : Stress test: Cardiac Cath :  Activity level: can do a flight of stairs without difficulty  Sleep Study/ CPAP :no  Fasting Blood Sugar :      / Checks Blood Sugar -- times a day:   Blood Thinner/ Instructions /Last Dose: ASA / Instructions/ Last Dose :  Xarelto- To stop one day prior per pt  Vascular Studies- 09/29/20 CBC and CMP done 11/26/2020 10/04/20- Behavioral Health Visit  Pt to have cataract surgery on 11/10/2020 at 2am at [place] noon on 11/10/2020 followup with eye surgeon .  PT to have Covid TEST on 11/10/2020 at 1000am  Pt in ED on 11-26-2020- for palpitations pt stated it was determined to be Dehydration.   Pt states had colonoscopy and endoscopy on 10/10/20- in epic and 2 teeth were chipped at that time ( upper front teeth) Pt states he had to have 2 new crowns placed and he will not sign a release regarding endo tube being placed.  Pt is aware he is having general anesthesia on 11/14/20.  CBC from 11/26/2020 routed to Dr Johnathan Hausen.

## 2020-11-09 NOTE — Progress Notes (Signed)
Completed medical history and preop instructions via phone for surgery on 11/14/20.  PT forgot preop appt on 11/09/20.

## 2020-11-10 ENCOUNTER — Other Ambulatory Visit: Payer: Self-pay

## 2020-11-10 ENCOUNTER — Other Ambulatory Visit (HOSPITAL_COMMUNITY)
Admission: RE | Admit: 2020-11-10 | Discharge: 2020-11-10 | Disposition: A | Discharge status: Home / Self Care | Payer: Medicare Other | Source: Ambulatory Visit | Attending: Surgery | Admitting: Surgery

## 2020-11-10 DIAGNOSIS — Z20822 Contact with and (suspected) exposure to covid-19: Secondary | ICD-10-CM | POA: Insufficient documentation

## 2020-11-10 DIAGNOSIS — H25811 Combined forms of age-related cataract, right eye: Secondary | ICD-10-CM | POA: Diagnosis not present

## 2020-11-10 DIAGNOSIS — Z01812 Encounter for preprocedural laboratory examination: Secondary | ICD-10-CM | POA: Insufficient documentation

## 2020-11-10 LAB — SARS CORONAVIRUS 2 (TAT 6-24 HRS): SARS Coronavirus 2: NEGATIVE

## 2020-11-10 MED ORDER — RIVAROXABAN 20 MG PO TABS: 20.0000 mg | ORAL_TABLET | Freq: Every day | ORAL | 0 refills | Status: DC

## 2020-11-10 NOTE — Telephone Encounter (Signed)
Prescription refill request for Xarelto received.  Indication:DVT Last office visit: needs appt Weight:90.7 kg Age:70 Scr:0.92 3/22 CrCl: 97.22 ml/min  Prescription refilled

## 2020-11-13 ENCOUNTER — Encounter (HOSPITAL_COMMUNITY): Payer: Self-pay | Admitting: Surgery

## 2020-11-13 MED ORDER — BUPIVACAINE LIPOSOME 1.3 % IJ SUSP
20.0000 mL | Freq: Once | INTRAMUSCULAR | Status: DC
  Filled 2020-11-13: qty 20

## 2020-11-13 NOTE — H&P (Signed)
Chief Complaint:  Left inguinal hernia  History of Present Illness:  [name] is an 70 y.o. male with a symptomatic left inguinal hernia.   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 he did very well from that surgery. He was lifting a heavy ladder and it started to fall. HEENT gravity grapple with the end really strained his lower abdomen. He began noticing a large bulge in his left inguinal region. This was consistent with a count hernia he had before affixed is on the right side. There is no recurrent right inguinal hernia. I discussed left inguinal hernia repair with mesh. It is been bothering him. He is having other issues in his high with glaucoma and is seeing Dr. Katy Brown for that. He has a history of DVT and he takes Xarelto.   He had an respiratory tract infection with pneumonia coring hospitalization and despite testing negative for COVID he has anosmia and he has very limited sense of taste. he would like to get this rodding will hernia repaired so we will schedule this at worse Brown as an outpatient. Physical Exam Philip Key B. Hassell Done MD; 10/12/2020 11:30 AM) General Note: family white male no acute distress HEENT exam he has acute angle glaucoma although I did not check him for that.  Neck no bruits Chest clear Heart sinus rhythm Abdomen prominent left inguinal hernia bulging and is reducible. Extremities full range of motion but with prior history of DVT in his right leg after hammertoe surgery     Assessment & Plan Philip Key B. Hassell Done MD; 10/12/2020 11:31 AM) LEFT INGUINAL HERNIA (K40.90) Impression: we'll schedule left inguinal hernia repair at [name]. He'll need to come off several to at least a couple of days prior to the surgery. He had colonoscopy and endoscopy 2 days ago by [name]. He scheduled to get a cataract repaired as soon as he can arrange someone to drive him.    Past Medical History:  Diagnosis Date  . Alcohol abuse, unspecified    history of  . Anxiety   . Arthritis    all over  . Bladder cancer [name]) 2011   Dr. Janice Norrie  . CAD (coronary artery disease) 2012   MI  . Depression   . Dyslipidemia   . GERD (gastroesophageal reflux disease)   . History of DVT (deep vein thrombosis) 11/08 and again in 2014   previously on coumadin  . Hypertension   . Hypothyroidism   . Inguinal hernia without mention of obstruction or gangrene, unilateral or unspecified, (not specified as recurrent)   . Internal hemorrhoids without mention of complication   . Myocardial infarction ([place])    2011   . Other abnormal glucose   . Personal history of other disorder of urinary system   . Pneumonia    hx of 12/2019   . Pulmonary embolism (HCC)    hx of   . Stroke ([place]) 05/25/2005   TIA  . Thrombocytopenia, unspecified ([place])   . TIA (transient ischemic attack) 2008    Past Surgical History:  Procedure Laterality Date  . CARDIAC CATHETERIZATION  05/2011   mild atherosclerosis  . CYSTOSCOPY  07/12/2011   Procedure: CYSTOSCOPY;  Surgeon: Philip Ben, MD;  Location: [facility];  Service: Urology;  Laterality: N/A;  one hour being requested for this case  . EYE SURGERY Left    injection from wet AMD (age related macular  degeneration)  . HERNIA REPAIR  8/11   repair of right indirect and direct hernias; incarerated umbilical hernia containing preperitoneal fat  . SHOULDER SURGERY  03/08/08   Left; arthroscopy  . TONSILLECTOMY     as child  . TRANSURETHRAL RESECTION OF BLADDER TUMOR  07/12/2011   Procedure: TRANSURETHRAL RESECTION OF BLADDER TUMOR (TURBT);  Surgeon: Philip Ben, MD;  Location: [facility];  Service: Urology;  Laterality: N/A;    Current Facility-Administered Medications  Medication Dose Route Frequency Provider Last Rate Last Admin  . [START ON 11/14/2020] bupivacaine liposome (EXPAREL) 1.3 % injection 266 mg  20 mL Infiltration Once Philip Hausen, MD       Current Outpatient Medications  Medication Sig Dispense Refill  . clorazepate (TRANXENE) 7.5 MG tablet Take 1 tablet (7.5 mg total) by mouth daily. 30 tablet 2  . ibuprofen (ADVIL,MOTRIN) 200 MG tablet Take 100-200 mg by mouth daily as needed for mild pain.    Marland Kitchen levothyroxine (SYNTHROID) 25 MCG tablet Take 1 tablet (25 mcg total) by mouth daily before breakfast. 90 tablet 1  . Multiple Vitamin (MULTIVITAMIN WITH MINERALS) TABS tablet Take 1 tablet by mouth daily.    . pantoprazole (PROTONIX) 40 MG tablet Take 1 tablet (40 mg total) by mouth every morning. Take  30 minutes before breakfast (Patient taking differently: Take 40 mg by mouth daily before breakfast.) 30 tablet 11  . sertraline (ZOLOFT) 100 MG tablet Take 1 tablet (100 mg total) by mouth daily. (Patient taking differently: Take 100 mg by mouth daily as needed (for mood).)    . SIMBRINZA 1-0.2 % SUSP Place 1 drop into both eyes 3 (three) times daily.    Marland Kitchen zolpidem (AMBIEN CR) 12.5 MG CR tablet Take 1 tablet (12.5 mg total) by mouth at bedtime. (Patient taking differently: Take 6.25-12.5 mg by mouth at bedtime.) 30 tablet 2  . fluocinonide cream (LIDEX) 0.76 % Apply 1 application topically 2 (two) times daily as needed. On the leg rash (Patient not taking: No sig reported) 30 g 1  . PROLENSA 0.07 % SOLN Place 1 drop into the right eye See admin instructions.    . rivaroxaban (XARELTO) 20 MG TABS tablet Take 1 tablet (20 mg total) by mouth daily with supper. 30 tablet 0   Crestor [rosuvastatin], Escitalopram oxalate, Latex, and Sulfonamide derivatives Family History  Problem Relation Age of Onset  . Other Father        Hypotension from medication causing GI bleed  . Heart failure Father   . Emphysema Mother   . Prostate cancer Neg Hx    Social History:   reports that he quit smoking about 16 years ago. His smoking use included cigarettes. He has a 37.50 pack-year smoking history. He has never used smokeless tobacco.  He reports previous drug use. Frequency: 2.00 times per week. Drug: Marijuana. He reports that he does not drink alcohol.   REVIEW OF SYSTEMS : Negative except for see above  Physical Exam:   There were no vitals taken for this visit. There is no height or weight on file to calculate BMI.  Gen:  WDWN WM NAD  Neurological: Alert and oriented to person, place, and time. Motor and sensory function is grossly intact  Head: Normocephalic and atraumatic.  Eyes: Conjunctivae are normal. Pupils are equal, round, and reactive to light. No scleral icterus.  Neck: Normal range of motion. Neck supple. No tracheal deviation or thyromegaly present.  Cardiovascular:  SR without murmurs or gallops.  No carotid bruits Breast:  Not examined Respiratory: Effort normal.  No respiratory distress. No chest wall tenderness. Breath sounds normal.  No wheezes, rales or rhonchi.  Abdomen:  nontender GU:  LIH-prior RIH by me Musculoskeletal: Normal range of motion. Extremities are nontender. No cyanosis, edema or clubbing noted Lymphadenopathy: No cervical, preauricular, postauricular or axillary adenopathy is present Skin: Skin is warm and dry. No rash noted. No diaphoresis. No erythema. No pallor. Pscyh: Normal mood and affect. Behavior is normal. Judgment and thought content normal.   LABORATORY RESULTS: No results found for this or any previous visit (from the past 48 hour(s)).   RADIOLOGY RESULTS: No results found.  Problem List: Patient Active Problem List   Diagnosis Date Noted  . Rash 10/04/2020  . Glaucoma suspect of both eyes 10/03/2020  . Nuclear sclerotic cataract of both eyes 08/08/2020  . Pain of finger 08/06/2020  . Abdominal wall pain 08/06/2020  . Exudative age-related macular degeneration of left eye with active choroidal neovascularization ([place]) 03/14/2020  . Advanced nonexudative age-related macular degeneration of right eye without subfoveal involvement 03/14/2020  . Advanced  nonexudative age-related macular degeneration of left eye with subfoveal involvement 03/14/2020  . Healthcare maintenance 01/24/2020  . Advance care planning 01/24/2020  . Hypothyroidism 01/24/2020  . Community acquired pneumonia 01/24/2020  . Vasovagal syncope 05/16/2019  . Other social stressor 02/27/2019  . FH: CAD (coronary artery disease) 02/27/2019  . Polycythemia 01/22/2019  . [place] headache 12/25/2018  . Ocular migraine 12/25/2018  . Rhinorrhea 12/25/2018  . Diarrhea 12/25/2018  . Postphlebitic syndrome 03/15/2018  . Brown term (current) use of anticoagulants 03/15/2018  . Varicose veins 03/29/2015  . Back pain 11/15/2014  . CAD - minor - 30-40% LAD, 30% RCA 10/12 06/24/2013  . Essential tremor 01/17/2012  . Pure hypercholesterolemia 06/29/2011  . History of bladder cancer 12/24/2010  . TRANSIENT ISCHEMIC ATTACKS, HX OF 05/01/2010  . HEMATURIA, HX OF 05/01/2010  . Thrombocytopenia ([place]) 01/08/2010  . Inguinal hernia without obstruction or gangrene 02/01/2009  . HYPERGLYCEMIA 08/30/2008  . SHOULDER PAIN, LEFT 02/08/2008  . History of DVT (deep vein thrombosis) 08/08/2007  . Acute thromboembolism of deep veins of lower extremity ([place]) 08/08/2007  . Anxiety state 12/30/2006  . DEPRESSION 12/30/2006    Assessment & Plan: LIH.  He took 10 mg of Xarelto on Sunday.  I told him to be off for two days but the nurse [facility]) told him to be off Xarelto for 1 day.  He understands that there is an increased risk of bleeding.      Matt B. Hassell Done, MD, [facility] [phone] beeper [phone]  11/13/2020 10:55 AM

## 2020-11-14 ENCOUNTER — Ambulatory Visit (HOSPITAL_COMMUNITY): Payer: Medicare Other | Admitting: Anesthesiology

## 2020-11-14 ENCOUNTER — Ambulatory Visit (HOSPITAL_COMMUNITY): Payer: Medicare Other | Admitting: Physician Assistant

## 2020-11-14 ENCOUNTER — Ambulatory Visit (HOSPITAL_COMMUNITY)
Admission: RE | Admit: 2020-11-14 | Discharge: 2020-11-14 | Disposition: A | Discharge status: Home / Self Care | Payer: Medicare Other | Source: Ambulatory Visit | Attending: Surgery | Admitting: Surgery

## 2020-11-14 ENCOUNTER — Encounter (HOSPITAL_COMMUNITY): Payer: Self-pay | Admitting: Surgery

## 2020-11-14 ENCOUNTER — Encounter (HOSPITAL_COMMUNITY)
Admission: RE | Disposition: A | Discharge status: Home / Self Care | Payer: Self-pay | Source: Ambulatory Visit | Attending: Surgery

## 2020-11-14 DIAGNOSIS — E785 Hyperlipidemia, unspecified: Secondary | ICD-10-CM | POA: Insufficient documentation

## 2020-11-14 DIAGNOSIS — Z79899 Other long term (current) drug therapy: Secondary | ICD-10-CM | POA: Insufficient documentation

## 2020-11-14 DIAGNOSIS — Z7901 Long term (current) use of anticoagulants: Secondary | ICD-10-CM | POA: Diagnosis not present

## 2020-11-14 DIAGNOSIS — Z86711 Personal history of pulmonary embolism: Secondary | ICD-10-CM | POA: Insufficient documentation

## 2020-11-14 DIAGNOSIS — Z8673 Personal history of transient ischemic attack (TIA), and cerebral infarction without residual deficits: Secondary | ICD-10-CM | POA: Insufficient documentation

## 2020-11-14 DIAGNOSIS — Z8249 Family history of ischemic heart disease and other diseases of the circulatory system: Secondary | ICD-10-CM | POA: Insufficient documentation

## 2020-11-14 DIAGNOSIS — K4090 Unilateral inguinal hernia, without obstruction or gangrene, not specified as recurrent: Secondary | ICD-10-CM | POA: Diagnosis not present

## 2020-11-14 DIAGNOSIS — Z8719 Personal history of other diseases of the digestive system: Secondary | ICD-10-CM | POA: Diagnosis not present

## 2020-11-14 DIAGNOSIS — Z8551 Personal history of malignant neoplasm of bladder: Secondary | ICD-10-CM | POA: Diagnosis not present

## 2020-11-14 DIAGNOSIS — Z86718 Personal history of other venous thrombosis and embolism: Secondary | ICD-10-CM | POA: Insufficient documentation

## 2020-11-14 DIAGNOSIS — I2510 Atherosclerotic heart disease of native coronary artery without angina pectoris: Secondary | ICD-10-CM | POA: Insufficient documentation

## 2020-11-14 DIAGNOSIS — I10 Essential (primary) hypertension: Secondary | ICD-10-CM | POA: Insufficient documentation

## 2020-11-14 DIAGNOSIS — I252 Old myocardial infarction: Secondary | ICD-10-CM | POA: Insufficient documentation

## 2020-11-14 DIAGNOSIS — Z87891 Personal history of nicotine dependence: Secondary | ICD-10-CM | POA: Insufficient documentation

## 2020-11-14 DIAGNOSIS — E7800 Pure hypercholesterolemia, unspecified: Secondary | ICD-10-CM | POA: Diagnosis not present

## 2020-11-14 HISTORY — PX: INGUINAL HERNIA REPAIR: SHX194

## 2020-11-14 HISTORY — DX: Pneumonia, unspecified organism: J18.9

## 2020-11-14 HISTORY — DX: Gastro-esophageal reflux disease without esophagitis: K21.9

## 2020-11-14 HISTORY — DX: Acute myocardial infarction, unspecified: I21.9

## 2020-11-14 HISTORY — DX: Hypothyroidism, unspecified: E03.9

## 2020-11-14 HISTORY — DX: Other pulmonary embolism without acute cor pulmonale: I26.99

## 2020-11-14 SURGERY — REPAIR, HERNIA, INGUINAL, ADULT
Anesthesia: General | Site: Inguinal | Laterality: Left

## 2020-11-14 MED ORDER — BUPIVACAINE-MELOXICAM ER 200-6 MG/7ML IJ SOLN
INTRAMUSCULAR | Status: DC | PRN
  Administered 2020-11-14: 7 mL

## 2020-11-14 MED ORDER — ONDANSETRON HCL 4 MG/2ML IJ SOLN
INTRAMUSCULAR | Status: DC | PRN
  Administered 2020-11-14: 4 mg via INTRAVENOUS

## 2020-11-14 MED ORDER — PROPOFOL 10 MG/ML IV BOLUS
INTRAVENOUS | Status: DC | PRN
  Administered 2020-11-14: 120 mg via INTRAVENOUS

## 2020-11-14 MED ORDER — MEPERIDINE HCL 50 MG/ML IJ SOLN
6.2500 mg | INTRAMUSCULAR | Status: DC | PRN
  Administered 2020-11-14: 12.5 mg via INTRAVENOUS

## 2020-11-14 MED ORDER — SUGAMMADEX SODIUM 200 MG/2ML IV SOLN
INTRAVENOUS | Status: DC | PRN
  Administered 2020-11-14: 300 mg via INTRAVENOUS

## 2020-11-14 MED ORDER — FENTANYL CITRATE (PF) 100 MCG/2ML IJ SOLN
INTRAMUSCULAR | Status: AC
  Administered 2020-11-14: 50 ug via INTRAVENOUS
  Filled 2020-11-14: qty 2

## 2020-11-14 MED ORDER — ONDANSETRON HCL 4 MG/2ML IJ SOLN
INTRAMUSCULAR | Status: AC
  Filled 2020-11-14: qty 2

## 2020-11-14 MED ORDER — DEXMEDETOMIDINE (PRECEDEX) IN NS 20 MCG/5ML (4 MCG/ML) IV SYRINGE
PREFILLED_SYRINGE | INTRAVENOUS | Status: AC
  Filled 2020-11-14: qty 5

## 2020-11-14 MED ORDER — ONDANSETRON HCL 4 MG/2ML IJ SOLN
4.0000 mg | Freq: Once | INTRAMUSCULAR | Status: AC | PRN
  Administered 2020-11-14: 4 mg via INTRAVENOUS

## 2020-11-14 MED ORDER — FENTANYL CITRATE (PF) 100 MCG/2ML IJ SOLN
INTRAMUSCULAR | Status: AC
  Filled 2020-11-14: qty 2

## 2020-11-14 MED ORDER — ACETAMINOPHEN 500 MG PO TABS
1000.0000 mg | ORAL_TABLET | ORAL | Status: AC
  Administered 2020-11-14: 1000 mg via ORAL
  Filled 2020-11-14: qty 2

## 2020-11-14 MED ORDER — BUPIVACAINE-MELOXICAM ER 200-6 MG/7ML IJ SOLN
INTRAMUSCULAR | Status: AC
  Filled 2020-11-14: qty 2

## 2020-11-14 MED ORDER — CHLORHEXIDINE GLUCONATE CLOTH 2 % EX PADS: 6.0000 | MEDICATED_PAD | Freq: Once | CUTANEOUS | Status: DC

## 2020-11-14 MED ORDER — EPHEDRINE 5 MG/ML INJ
INTRAVENOUS | Status: AC
  Filled 2020-11-14: qty 10

## 2020-11-14 MED ORDER — MIDAZOLAM HCL 5 MG/5ML IJ SOLN
INTRAMUSCULAR | Status: DC | PRN
  Administered 2020-11-14: 1 mg via INTRAVENOUS

## 2020-11-14 MED ORDER — FENTANYL CITRATE (PF) 100 MCG/2ML IJ SOLN
25.0000 ug | INTRAMUSCULAR | Status: DC | PRN
  Administered 2020-11-14 (×2): 50 ug via INTRAVENOUS

## 2020-11-14 MED ORDER — 0.9 % SODIUM CHLORIDE (POUR BTL) OPTIME
TOPICAL | Status: DC | PRN
  Administered 2020-11-14: 1000 mL

## 2020-11-14 MED ORDER — CEFAZOLIN SODIUM-DEXTROSE 2-4 GM/100ML-% IV SOLN
2.0000 g | INTRAVENOUS | Status: AC
  Administered 2020-11-14: 2 g via INTRAVENOUS
  Filled 2020-11-14: qty 100

## 2020-11-14 MED ORDER — ROCURONIUM BROMIDE 10 MG/ML (PF) SYRINGE
PREFILLED_SYRINGE | INTRAVENOUS | Status: AC
  Filled 2020-11-14: qty 10

## 2020-11-14 MED ORDER — MIDAZOLAM HCL 2 MG/2ML IJ SOLN
INTRAMUSCULAR | Status: AC
  Filled 2020-11-14: qty 2

## 2020-11-14 MED ORDER — EPHEDRINE SULFATE-NACL 50-0.9 MG/10ML-% IV SOSY
PREFILLED_SYRINGE | INTRAVENOUS | Status: DC | PRN
  Administered 2020-11-14: 10 mg via INTRAVENOUS

## 2020-11-14 MED ORDER — LIDOCAINE 2% (20 MG/ML) 5 ML SYRINGE
INTRAMUSCULAR | Status: AC
  Filled 2020-11-14: qty 5

## 2020-11-14 MED ORDER — OXYCODONE HCL 5 MG PO TABS
5.0000 mg | ORAL_TABLET | Freq: Once | ORAL | Status: AC
  Administered 2020-11-14: 5 mg via ORAL

## 2020-11-14 MED ORDER — OXYCODONE HCL 5 MG PO TABS
ORAL_TABLET | ORAL | Status: AC
  Filled 2020-11-14: qty 1

## 2020-11-14 MED ORDER — LACTATED RINGERS IV SOLN: INTRAVENOUS | Status: DC

## 2020-11-14 MED ORDER — CHLORHEXIDINE GLUCONATE 0.12 % MT SOLN: 15.0000 mL | Freq: Once | OROMUCOSAL | Status: AC

## 2020-11-14 MED ORDER — HYDROCODONE-ACETAMINOPHEN 5-325 MG PO TABS: 1.0000 | ORAL_TABLET | Freq: Four times a day (QID) | ORAL | 0 refills | Status: DC | PRN

## 2020-11-14 MED ORDER — KETOROLAC TROMETHAMINE 15 MG/ML IJ SOLN
15.0000 mg | Freq: Once | INTRAMUSCULAR | Status: AC | PRN
  Administered 2020-11-14: 15 mg via INTRAVENOUS

## 2020-11-14 MED ORDER — ORAL CARE MOUTH RINSE
15.0000 mL | Freq: Once | OROMUCOSAL | Status: AC
  Administered 2020-11-14: 15 mL via OROMUCOSAL

## 2020-11-14 MED ORDER — KETOROLAC TROMETHAMINE 15 MG/ML IJ SOLN
INTRAMUSCULAR | Status: AC
  Filled 2020-11-14: qty 1

## 2020-11-14 MED ORDER — MEPERIDINE HCL 50 MG/ML IJ SOLN
INTRAMUSCULAR | Status: AC
  Filled 2020-11-14: qty 1

## 2020-11-14 MED ORDER — LIDOCAINE HCL (CARDIAC) PF 100 MG/5ML IV SOSY
PREFILLED_SYRINGE | INTRAVENOUS | Status: DC | PRN
  Administered 2020-11-14: 60 mg via INTRAVENOUS

## 2020-11-14 MED ORDER — ROCURONIUM BROMIDE 100 MG/10ML IV SOLN
INTRAVENOUS | Status: DC | PRN
  Administered 2020-11-14: 60 mg via INTRAVENOUS

## 2020-11-14 MED ORDER — DEXAMETHASONE SODIUM PHOSPHATE 10 MG/ML IJ SOLN
INTRAMUSCULAR | Status: DC | PRN
  Administered 2020-11-14: 10 mg via INTRAVENOUS

## 2020-11-14 MED ORDER — SCOPOLAMINE 1 MG/3DAYS TD PT72
1.0000 | MEDICATED_PATCH | TRANSDERMAL | Status: DC
  Administered 2020-11-14: 1.5 mg via TRANSDERMAL
  Filled 2020-11-14: qty 1

## 2020-11-14 MED ORDER — FENTANYL CITRATE (PF) 100 MCG/2ML IJ SOLN
INTRAMUSCULAR | Status: DC | PRN
  Administered 2020-11-14: 50 ug via INTRAVENOUS
  Administered 2020-11-14: 100 ug via INTRAVENOUS
  Administered 2020-11-14: 50 ug via INTRAVENOUS

## 2020-11-14 SURGICAL SUPPLY — 33 items
ADH SKN CLS APL DERMABOND .7 (GAUZE/BANDAGES/DRESSINGS) ×1
BLADE SURG 15 STRL LF DISP TIS (BLADE) ×1 IMPLANT
BLADE SURG 15 STRL SS (BLADE) ×2
COVER SURGICAL LIGHT HANDLE (MISCELLANEOUS) ×2 IMPLANT
COVER WAND RF STERILE (DRAPES) IMPLANT
DECANTER SPIKE VIAL GLASS SM (MISCELLANEOUS) ×2 IMPLANT
DERMABOND ADVANCED (GAUZE/BANDAGES/DRESSINGS) ×1
DERMABOND ADVANCED .7 DNX12 (GAUZE/BANDAGES/DRESSINGS) ×1 IMPLANT
DISSECTOR ROUND CHERRY 3/8 STR (MISCELLANEOUS) IMPLANT
DRAIN PENROSE 0.5X18 (DRAIN) ×2 IMPLANT
DRAPE LAPAROTOMY TRNSV 102X78 (DRAPES) ×2 IMPLANT
ELECT REM PT RETURN 15FT ADLT (MISCELLANEOUS) ×2 IMPLANT
GAUZE SPONGE 4X4 12PLY STRL (GAUZE/BANDAGES/DRESSINGS) ×1 IMPLANT
GLOVE BIOGEL M 8.0 STRL (GLOVE) ×2 IMPLANT
GOWN STRL REUS W/TWL XL LVL3 (GOWN DISPOSABLE) ×4 IMPLANT
KIT BASIN OR (CUSTOM PROCEDURE TRAY) ×2 IMPLANT
KIT TURNOVER KIT A (KITS) ×2 IMPLANT
MESH HERNIA 3X6 (Mesh General) ×1 IMPLANT
NEEDLE HYPO 22GX1.5 SAFETY (NEEDLE) ×2 IMPLANT
PACK BASIC VI WITH GOWN DISP (CUSTOM PROCEDURE TRAY) ×2 IMPLANT
PENCIL SMOKE EVACUATOR (MISCELLANEOUS) IMPLANT
SPONGE LAP 4X18 RFD (DISPOSABLE) ×3 IMPLANT
STAPLER VISISTAT 35W (STAPLE) IMPLANT
SUT PROLENE 2 0 CT2 30 (SUTURE) ×6 IMPLANT
SUT SILK 2 0 SH (SUTURE) IMPLANT
SUT VIC AB 2-0 SH 27 (SUTURE) ×2
SUT VIC AB 2-0 SH 27X BRD (SUTURE) ×1 IMPLANT
SUT VIC AB 4-0 SH 18 (SUTURE) ×2 IMPLANT
SUT VICRYL 4-0 (SUTURE) ×2 IMPLANT
SYR 20ML LL LF (SYRINGE) ×2 IMPLANT
SYR BULB IRRIG 60ML STRL (SYRINGE) ×2 IMPLANT
TOWEL OR 17X26 10 PK STRL BLUE (TOWEL DISPOSABLE) ×2 IMPLANT
TOWEL OR NON WOVEN STRL DISP B (DISPOSABLE) ×2 IMPLANT

## 2020-11-14 NOTE — Anesthesia Postprocedure Evaluation (Signed)
Anesthesia Post Note  Patient: [name]  Procedure(s) Performed: OPEN LEFT INGUINAL HERNIA REPAIR WITH MESH (Left Inguinal)     Patient location during evaluation: PACU Anesthesia Type: General Level of consciousness: awake and alert and oriented Pain management: pain level controlled Vital Signs Assessment: post-procedure vital signs reviewed and stable Respiratory status: spontaneous breathing, nonlabored ventilation and respiratory function stable Cardiovascular status: blood pressure returned to baseline and stable Postop Assessment: no apparent nausea or vomiting Anesthetic complications: no   No complications documented.  Last Vitals:  Vitals:   11/14/20 1300 11/14/20 1315  BP: 139/84 131/79  Pulse: (!) 56 (!) 51  Resp: 16 11  Temp:    SpO2: 98% 96%    Last Pain:  Vitals:   11/14/20 1300  TempSrc:   PainSc: 4                  Kastin Cerda A.

## 2020-11-14 NOTE — Anesthesia Preprocedure Evaluation (Addendum)
Anesthesia Evaluation  Patient identified by MRN, date of birth, ID band Patient awake    Reviewed: Allergy & Precautions, NPO status , Patient's Chart, lab work & pertinent test results  Airway Mallampati: I  TM Distance: >3 FB Neck ROM: Full    Dental no notable dental hx. (+) Poor Dentition   Pulmonary pneumonia, former smoker, PE   Pulmonary exam normal breath sounds clear to auscultation       Cardiovascular hypertension, + CAD, + Past MI and + DVT  Normal cardiovascular exam Rhythm:Regular Rate:Normal  ECG: NSR, rate 68   Neuro/Psych  Headaches, PSYCHIATRIC DISORDERS Anxiety Depression TIACVA, No Residual Symptoms    GI/Hepatic GERD  Medicated and Controlled,(+)     substance abuse  ,   Endo/Other  Hypothyroidism   Renal/GU negative Renal ROS     Musculoskeletal  (+) Arthritis ,   Abdominal   Peds  Hematology negative hematology ROS (+)   Anesthesia Other Findings left inguinal hernia  Reproductive/Obstetrics                           Anesthesia Physical Anesthesia Plan  ASA: III  Anesthesia Plan: General   Post-op Pain Management:    Induction: Intravenous  PONV Risk Score and Plan: 3 and Ondansetron, Dexamethasone, Midazolam and Treatment may vary due to age or medical condition  Airway Management Planned: Oral ETT  Additional Equipment:   Intra-op Plan:   Post-operative Plan: Extubation in OR  Informed Consent: I have reviewed the patients History and Physical, chart, labs and discussed the procedure including the risks, benefits and alternatives for the proposed anesthesia with the patient or authorized representative who has indicated his/her understanding and acceptance.     Dental advisory given  Plan Discussed with: CRNA  Anesthesia Plan Comments:         Anesthesia Quick Evaluation

## 2020-11-14 NOTE — Discharge Instructions (Signed)
Stay off Xarelto for ~3 days.     General Anesthesia, Adult, Care After This sheet gives you information about how to care for yourself after your procedure. Your health care provider may also give you more specific instructions. If you have problems or questions, contact your health care provider. What can I expect after the procedure? After the procedure, the following side effects are common:  Pain or discomfort at the IV site.  Nausea.  Vomiting.  Sore throat.  Trouble concentrating.  Feeling cold or chills.  Feeling weak or tired.  Sleepiness and fatigue.  Soreness and body aches. These side effects can affect parts of the body that were not involved in surgery. Follow these instructions at home: For the time period you were told by your health care provider:  Rest.  Do not participate in activities where you could fall or become injured.  Do not drive or use machinery.  Do not drink alcohol.  Do not take sleeping pills or medicines that cause drowsiness.  Do not make important decisions or sign legal documents.  Do not take care of children on your own.   Eating and drinking  Follow any instructions from your health care provider about eating or drinking restrictions.  When you feel hungry, start by eating small amounts of foods that are soft and easy to digest (bland), such as toast. Gradually return to your regular diet.  Drink enough fluid to keep your urine pale yellow.  If you vomit, rehydrate by drinking water, juice, or clear broth. General instructions  If you have sleep apnea, surgery and certain medicines can increase your risk for breathing problems. Follow instructions from your health care provider about wearing your sleep device: ? Anytime you are sleeping, including during daytime naps. ? While taking prescription pain medicines, sleeping medicines, or medicines that make you drowsy.  Have a responsible adult stay with you for the time you  are told. It is important to have someone help care for you until you are awake and alert.  Return to your normal activities as told by your health care provider. Ask your health care provider what activities are safe for you.  Take over-the-counter and prescription medicines only as told by your health care provider.  If you smoke, do not smoke without supervision.  Keep all follow-up visits as told by your health care provider. This is important. Contact a health care provider if:  You have nausea or vomiting that does not get better with medicine.  You cannot eat or drink without vomiting.  You have pain that does not get better with medicine.  You are unable to pass urine.  You develop a skin rash.  You have a fever.  You have redness around your IV site that gets worse. Get help right away if:  You have difficulty breathing.  You have chest pain.  You have blood in your urine or stool, or you vomit blood. Summary  After the procedure, it is common to have a sore throat or nausea. It is also common to feel tired.  Have a responsible adult stay with you for the time you are told. It is important to have someone help care for you until you are awake and alert.  When you feel hungry, start by eating small amounts of foods that are soft and easy to digest (bland), such as toast. Gradually return to your regular diet.  Drink enough fluid to keep your urine pale yellow.  Return to  your normal activities as told by your health care provider. Ask your health care provider what activities are safe for you. This information is not intended to replace advice given to you by your health care provider. Make sure you discuss any questions you have with your health care provider. Document Revised: 04/27/2020 Document Reviewed: 11/25/2019 Elsevier Patient Education  2021 [employer].

## 2020-11-14 NOTE — Anesthesia Procedure Notes (Signed)
Procedure Name: Intubation Date/Time: 11/14/2020 10:18 AM Performed by: [place] Cassey Hurrell C, CRNA Pre-anesthesia Checklist: Patient identified, Emergency Drugs available, Suction available and Patient being monitored Patient Re-evaluated:Patient Re-evaluated prior to induction Oxygen Delivery Method: Circle system utilized Preoxygenation: Pre-oxygenation with 100% oxygen Induction Type: IV induction Ventilation: Mask ventilation without difficulty Laryngoscope Size: Glidescope, Mac and 4 Grade View: Grade I Tube type: Oral Tube size: 7.5 mm Number of attempts: 1 Airway Equipment and Method: Oral airway and Rigid stylet Placement Confirmation: ETT inserted through vocal cords under direct vision,  positive ETCO2 and breath sounds checked- equal and bilateral Secured at: 21 cm Tube secured with: Tape Dental Injury: Teeth and Oropharynx as per pre-operative assessment  Comments: Elective glidescope due to history of previous dental injury with endoscopy.  Teeth and oral pharynx as per pre-op after intubation

## 2020-11-14 NOTE — Op Note (Signed)
NAVIN DOGAN  10/11/1950   11/14/2020    PCP:  Tonia Ghent, MD   Surgeon: Kaylyn Lim, MD, FACS  Asst:  Lonia Skinner MD  Anes:  general  Preop Dx: [facility] Postop Dx: Left indirect inguinal hernia  Procedure: Open LIH with mesh and Zynrelf Location Surgery: [facility] 1 Complications: none  EBL:   10 cc  Drains: none  Description of Procedure:  The patient was taken to OR 1 .  After anesthesia was administered and the patient was prepped  with chloroprep  and a timeout was performed.  An inguinal oblique incision was made and carried down to the external oblique which were incised along the fibers.  The patient had been off his Xarelto for 1 day and he was a little more moist than usual.  Mobilized his cord and put Penrose around it.  There was a count of a lax direct floor but no obvious defect.  He did have a thickened cord proximally with a lipoma and we opened and got into some oozing which we controlled with heme Bovie and allowed that to be reduced.  The internal ring was approximated with a single Prolene suture 2 oh laterally through the muscle and through the inguinal ligament.  The floor the canal was then repaired with Bard mesh cut to fit sutured along the inguinal ligament with running 2-0 Prolene.  Medially it was sewn with a running 2-0 Prolene in the the tails overlapped and were tucked laterally.  One of the tails was sutured to the inguinal ligament.  The external oblique fascia were then closed with a running 2-0 Vicryl.  Zan relief was infused using approximately 5 to 6 cc both beneath the external oblique and above.  The 4-0 Vicryl was used approximate sub-Scarpa's fascia and then the wound was closed with a running subcuticular 4-0 Monocryl and Dermabond.  The patient tolerated the procedure well and was taken to the PACU in stable condition.     Matt B. Hassell Done, [place], [facility], [place]

## 2020-11-14 NOTE — Transfer of Care (Signed)
Immediate Anesthesia Transfer of Care Note  Patient: [name]  Procedure(s) Performed: OPEN LEFT INGUINAL HERNIA REPAIR WITH MESH (Left Inguinal)  Patient Location: PACU  Anesthesia Type:General  Level of Consciousness: awake, alert  and oriented  Airway & Oxygen Therapy: Patient Spontanous Breathing and Patient connected to face mask oxygen  Post-op Assessment: Report given to RN and Post -op Vital signs reviewed and stable  Post vital signs: Reviewed and stable  Last Vitals:  Vitals Value Taken Time  BP 126/92 11/14/20 1230  Temp    Pulse 78 11/14/20 1233  Resp 15 11/14/20 1233  SpO2 95 % 11/14/20 1233  Vitals shown include unvalidated device data.  Last Pain:  Vitals:   11/14/20 0740  TempSrc: Oral         Complications: No complications documented.

## 2020-11-14 NOTE — Interval H&P Note (Signed)
History and Physical Interval Note:  11/14/2020 8:46 AM  [name]  has presented today for surgery, with the diagnosis of left inguinal hernia.  The various methods of treatment have been discussed with the patient and family. After consideration of risks, benefits and other options for treatment, the patient has consented to  Procedure(s) with comments: OPEN LEFT INGUINAL HERNIA REPAIR (Left) - 90 MIN as a surgical intervention.  The patient's history has been reviewed, patient examined, no change in status, stable for surgery.  I have reviewed the patient's chart and labs.  Questions were answered to the patient's satisfaction.     [name]

## 2020-11-15 ENCOUNTER — Encounter (HOSPITAL_COMMUNITY): Payer: Self-pay | Admitting: Surgery

## 2020-11-16 ENCOUNTER — Ambulatory Visit: Payer: Medicare Other | Admitting: Family Medicine

## 2020-11-27 ENCOUNTER — Other Ambulatory Visit: Payer: Self-pay

## 2020-11-27 ENCOUNTER — Encounter: Payer: Self-pay | Admitting: Family Medicine

## 2020-11-27 ENCOUNTER — Ambulatory Visit: Payer: Medicare Other | Admitting: Family Medicine

## 2020-11-27 ENCOUNTER — Ambulatory Visit (INDEPENDENT_AMBULATORY_CARE_PROVIDER_SITE_OTHER)
Admission: RE | Admit: 2020-11-27 | Discharge: 2020-11-27 | Disposition: A | Discharge status: Home / Self Care | Payer: Medicare Other | Source: Ambulatory Visit | Attending: Family Medicine | Admitting: Family Medicine

## 2020-11-27 VITALS — BP 128/66 | HR 66 | Temp 97.9°F | Ht 74.0 in | Wt 199.5 lb

## 2020-11-27 DIAGNOSIS — M19121 Post-traumatic osteoarthritis, right elbow: Secondary | ICD-10-CM

## 2020-11-27 DIAGNOSIS — G5621 Lesion of ulnar nerve, right upper limb: Secondary | ICD-10-CM

## 2020-11-27 DIAGNOSIS — M19021 Primary osteoarthritis, right elbow: Secondary | ICD-10-CM | POA: Diagnosis not present

## 2020-11-27 NOTE — Progress Notes (Signed)
Philip Lapre T. Valencia Kassa, MD, [place] at [facility] [place], [address]  Phone: [phone]  FAX: [place] - 70 y.o. male  MRN [id number]  Date of Birth: June 23, 1951  Date: 11/27/2020  PCP: Tonia Ghent, MD  Referral: Tonia Ghent, MD  Chief Complaint  Patient presents with  . Elbow Pain    Right    This visit occurred during the SARS-CoV-2 public health emergency.  Safety protocols were in place, including screening questions prior to the visit, additional usage of staff PPE, and extensive cleaning of exam room while observing appropriate contact time as indicated for disinfecting solutions.   Subjective:   [name] is a 70 y.o. very pleasant male patient with Body mass index is 25.61 kg/m. who presents with the following:  New consult, R elbow pain: He has significant pain and numbness in his right elbow, radiating from the ulnar groove to the distal fourth and fifth digits including the forearm.  His significant history of 4 years ago he had a proximal radius and ulna fracture.  He does tendon pianos for living.  This past summer he did a very large amount of manual labor staining his home and doing a great deal of pressure washing. He hears a mechanical grinding with movement at the elbow.  He also lacks some degrees of extension.  He has no pain with supination pronation, and he does not have any pain in the lateral epicondyle region.  He also has no pain with shoulder movement.  He denies neck pain.  R grip is decreased  Review of Systems is noted in the HPI, as appropriate   Objective:   BP 128/66   Pulse 66   Temp 97.9 F (36.6 C) (Temporal)   Ht 6\' 2"  (1.88 m)   Wt 199 lb 8 oz (90.5 kg)   SpO2 96%   BMI 25.61 kg/m    R elbow Ecchymosis or edema: neg ROM: full flexion, pronation, supination.  He lacks approximately 7 degrees  of extension. Shoulder ROM: Full Flexion: 5/5 Extension: 5/5 Supination: 5/5  Pronation: 5/5 Wrist ext: 5/5 Wrist flexion: 5/5 No gross bony abnormality Varus and Valgus stress: stable ECRB tenderness: neg Medial epicondyle: NT Lateral epicondyle, resisted wrist extension from wrist full pronation and flexion: NT grip: 4/5, notably decreased compared to the contralateral side  sensation decreased in an ulnar distribution from the ulnar groove to the fourth and fifth digits.   Tinel's, Elbow: Markedly positive  Radiology: DG Elbow Complete Right  Result Date: 11/29/2020 CLINICAL DATA:  Prior history of severe fracture. EXAM: RIGHT ELBOW - COMPLETE 3+ VIEW COMPARISON:  No recent prior. FINDINGS: Prominent diffuse degenerative change with prominent osteophyte formations. Loose bodies most likely present. Tiny corticated bony density noted adjacent to the proximal radius most likely old fracture fragment. Probable old healed fracture coronoid process of ulna. No evidence of acute fracture. No dislocation. Elbow joint effusion cannot be completely excluded. IMPRESSION: 1. Prominent diffuse degenerative change. Loose bodies may be present. 2. Evidence of old fractures. No evidence of acute fracture or dislocation. Elbow joint effusion cannot be excluded. Electronically Signed   By: Marcello Moores  Register   On: 11/29/2020 05:18     Assessment and Plan:     ICD-10-CM   1. Cubital tunnel syndrome on right  G56.21 DG Elbow Complete Right  2. Post-traumatic osteoarthritis of right elbow  M19.121 DG Elbow Complete Right   He has a classic examination for cubital tunnel syndrome that has been present and worsening over a number of years.  It did get worse after quite a bit of upper extremity labor over the summer.  He also has significant posttraumatic arthritis after a ulna and radius fracture 40 years ago.  This is likely playing a role.  I reviewed with him some classic ulnar neuropathy work  including a nighttime splint, and using a elbow pad when resting his elbow.  He has no interest in any operative intervention.  He has had some decreased fine motor activity after he had 2 different trigger finger releases.  He will very likely have the symptoms off and on indefinitely.  Medications Discontinued During This Encounter  Medication Reason  . fluocinonide cream (LIDEX) 0.05 % Completed Course   Orders Placed This Encounter  Procedures  . DG Elbow Complete Right    Follow-up: No follow-ups on file.  Signed,  Maud Deed. Burdell Peed, MD   Outpatient Encounter Medications as of 11/27/2020  Medication Sig  . clorazepate (TRANXENE) 7.5 MG tablet Take 1 tablet (7.5 mg total) by mouth daily.  Marland Kitchen HYDROcodone-acetaminophen (NORCO/VICODIN) 5-325 MG tablet Take 1 tablet by mouth every 6 (six) hours as needed for moderate pain.  Marland Kitchen ibuprofen (ADVIL,MOTRIN) 200 MG tablet Take 100-200 mg by mouth daily as needed for mild pain.  Marland Kitchen levothyroxine (SYNTHROID) 25 MCG tablet Take 1 tablet (25 mcg total) by mouth daily before breakfast.  . Multiple Vitamin (MULTIVITAMIN WITH MINERALS) TABS tablet Take 1 tablet by mouth daily.  . pantoprazole (PROTONIX) 40 MG tablet Take 1 tablet (40 mg total) by mouth every morning. Take  30 minutes before breakfast (Patient taking differently: Take 40 mg by mouth every morning.)  . PROLENSA 0.07 % SOLN Place 1 drop into the right eye See admin instructions.  . rivaroxaban (XARELTO) 20 MG TABS tablet Take 1 tablet (20 mg total) by mouth daily with supper.  . sertraline (ZOLOFT) 100 MG tablet Take 1 tablet (100 mg total) by mouth daily. (Patient taking differently: Take 100 mg by mouth daily as needed (for mood).)  . SIMBRINZA 1-0.2 % SUSP Place 1 drop into both eyes 3 (three) times daily.  Marland Kitchen zolpidem (AMBIEN CR) 12.5 MG CR tablet Take 1 tablet (12.5 mg total) by mouth at bedtime. (Patient taking differently: Take 6.25-12.5 mg by mouth at bedtime.)  . [DISCONTINUED]  fluocinonide cream (LIDEX) 8.82 % Apply 1 application topically 2 (two) times daily as needed. On the leg rash (Patient not taking: No sig reported)   No facility-administered encounter medications on file as of 11/27/2020.

## 2020-12-09 DIAGNOSIS — G5621 Lesion of ulnar nerve, right upper limb: Secondary | ICD-10-CM | POA: Diagnosis not present

## 2021-01-17 ENCOUNTER — Telehealth: Payer: Self-pay | Admitting: Family Medicine

## 2021-01-17 NOTE — Telephone Encounter (Signed)
LVM for pt to rtn my call to schedule AWV with [facility].  

## 2021-01-18 ENCOUNTER — Other Ambulatory Visit: Payer: Self-pay | Admitting: Adult Health

## 2021-01-18 DIAGNOSIS — F411 Generalized anxiety disorder: Secondary | ICD-10-CM

## 2021-01-20 NOTE — Telephone Encounter (Signed)
Last apt 09/2020 due back 3 months

## 2021-01-25 ENCOUNTER — Encounter (INDEPENDENT_AMBULATORY_CARE_PROVIDER_SITE_OTHER): Payer: Medicare Other | Admitting: Ophthalmology

## 2021-02-04 ENCOUNTER — Other Ambulatory Visit: Payer: Self-pay | Admitting: Cardiovascular Disease

## 2021-02-05 NOTE — Telephone Encounter (Signed)
Prescription refill request for Xarelto received.  Indication:DVT Last office visit:Needs visit Weight:90.5 kg Age:70 Scr:0.9 CrCl:99.16 ml/min  Prescription refilled

## 2021-02-05 NOTE — Telephone Encounter (Signed)
19m, 90.5kg, scr 0.92 11/01/20, lovw/[facility] 04/26/19(OVERDUE), ccr 97. Will send in a one month supply with msg stating need a appt

## 2021-02-08 ENCOUNTER — Encounter: Payer: Self-pay | Admitting: Adult Health

## 2021-02-08 ENCOUNTER — Other Ambulatory Visit: Payer: Self-pay | Admitting: Physician Assistant

## 2021-02-08 ENCOUNTER — Ambulatory Visit (INDEPENDENT_AMBULATORY_CARE_PROVIDER_SITE_OTHER): Payer: Medicare Other | Admitting: Adult Health

## 2021-02-08 ENCOUNTER — Other Ambulatory Visit: Payer: Self-pay

## 2021-02-08 DIAGNOSIS — G4700 Insomnia, unspecified: Secondary | ICD-10-CM | POA: Diagnosis not present

## 2021-02-08 DIAGNOSIS — F411 Generalized anxiety disorder: Secondary | ICD-10-CM

## 2021-02-08 DIAGNOSIS — F331 Major depressive disorder, recurrent, moderate: Secondary | ICD-10-CM

## 2021-02-08 MED ORDER — CLORAZEPATE DIPOTASSIUM 7.5 MG PO TABS
ORAL_TABLET | ORAL | 2 refills | Status: DC
Start: 2021-02-08 — End: 2021-06-13

## 2021-02-08 MED ORDER — ZOLPIDEM TARTRATE ER 12.5 MG PO TBCR: 12.5000 mg | EXTENDED_RELEASE_TABLET | Freq: Every day | ORAL | 2 refills | Status: DC

## 2021-02-08 NOTE — Progress Notes (Signed)
[name] [id number] October 06, 1950 70 y.o.  Subjective:   Patient ID:  [name] is a 70 y.o. (DOB June 30, 1951) male.  Chief Complaint: No chief complaint on file.   HPI [name] presents to the office today for follow-up of insomnia, anxiety, and depression.   Describes mood today as "ok". Pleasant. Denies tearfulness. Mood symptoms - denies depression, anxiety, and irritability. Stating "I'm doing good". Feels like medications are working well for him. Recent endoscopy and colonoscopy - normal. Teeth chipped during procedure and had to spend $7,000 on 6 crowns. Hernia repair - "down for 3 weeks". Getting eye injections - macular degeneration. Elbow pain related to an injury years ago - hurts more when working. 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 Feels like current medications are working for him. Stable interest and motivation. Taking medications as prescribed.  Energy levels stable. Active, has a regular exercise routine - taking time off with recent surgery.  Enjoys some usual interests and activities. Single. Lives with a roommate. Mostly staying home.  Appetite adequate. Has not regained sense of taste and smell. Weight fluctuates 185 from 210 pounds. Sleeps better some nights than others. Averages 8 hours. Focus and concentration stable. Completing tasks. Managing aspects of household. Works as a Sports coach. Denies SI or HI.  Denies AH or VH.  Previous medication trials: Zoloft, Ambien, Tranxene    GAD-7    Flowsheet Row Office Visit from 08/04/2020 in [place] at [facility]  Total GAD-7 Score [place] Office Visit from 08/04/2020 in [place] at [facility] from 12/24/2019 in [place] at [facility]  PHQ-2 Total Score 0 0  PHQ-9 Total Score -- 0      Flowsheet Row ED from 11/01/2020 in [place] Error: Question 6 not populated         Review of Systems:  Review of Systems  Musculoskeletal:  Negative for gait problem.  Neurological:  Negative for tremors.  Psychiatric/Behavioral:         Please refer to HPI   Medications: I have reviewed the patient's current medications.  Current Outpatient Medications  Medication Sig Dispense Refill   clorazepate (TRANXENE) 7.5 MG tablet TAKE 1 TABLET(7.5 MG) BY MOUTH DAILY 30 tablet 2   HYDROcodone-acetaminophen (NORCO/VICODIN) 5-325 MG tablet Take 1 tablet by mouth every 6 (six) hours as needed for moderate pain. 15 tablet 0   ibuprofen (ADVIL,MOTRIN) 200 MG tablet Take 100-200 mg by mouth daily as needed for mild pain.     levothyroxine (SYNTHROID) 25 MCG tablet Take 1 tablet (25 mcg total) by mouth daily before breakfast. 90 tablet 1   Multiple Vitamin (MULTIVITAMIN WITH MINERALS) TABS tablet Take 1 tablet by mouth daily.     pantoprazole (PROTONIX) 40 MG tablet Take 1 tablet (40 mg total) by mouth every morning. Take  30 minutes before breakfast (Patient taking differently: Take 40 mg by mouth every morning.) 30 tablet 11   PROLENSA 0.07 % SOLN Place 1 drop into the right eye See admin instructions.     rivaroxaban (XARELTO) 20 MG TABS tablet Take 1 tablet (20 mg total) by mouth daily with supper. Appointment needed for further refills 30 tablet 0   sertraline (ZOLOFT) 100 MG tablet Take 1 tablet (100 mg total) by mouth daily. (Patient taking differently: Take 100 mg by mouth daily as needed (for mood).)  SIMBRINZA 1-0.2 % SUSP Place 1 drop into both eyes 3 (three) times daily.     zolpidem (AMBIEN CR) 12.5 MG CR tablet Take 1 tablet (12.5 mg total) by mouth at bedtime. 30 tablet 2   No current facility-administered medications for this visit.    Medication Side Effects: None  Allergies:  Allergies  Allergen Reactions   Crestor [Rosuvastatin]     Mood changes   Escitalopram Oxalate     Mood changes, irritablity   Latex Other (See Comments)    Blood in urine  with latex catheter- but this was likely from mechanical irritation, not from the latex itself   Sulfonamide Derivatives Rash    Past Medical History:  Diagnosis Date   Alcohol abuse, unspecified    history of   Anxiety    Arthritis    all over   Bladder cancer [facility]) 2011   Dr. Janice Norrie   CAD (coronary artery disease) 2012   MI   Depression    Dyslipidemia    GERD (gastroesophageal reflux disease)    History of DVT (deep vein thrombosis) 11/08 and again in 2014   previously on coumadin   Hypertension    Hypothyroidism    Inguinal hernia without mention of obstruction or gangrene, unilateral or unspecified, (not specified as recurrent)    Internal hemorrhoids without mention of complication    Myocardial infarction [facility])    2011    Other abnormal glucose    Personal history of other disorder of urinary system    Pneumonia    hx of 12/2019    Pulmonary embolism (HCC)    hx of    Stroke ([place]) 05/25/2005   TIA   Thrombocytopenia, unspecified ([place])    TIA (transient ischemic attack) 2008    Past Medical History, Surgical history, Social history, and Family history were reviewed and updated as appropriate.   Please see review of systems for further details on the patient's review from today.   Objective:   Physical Exam:  There were no vitals taken for this visit.  Physical Exam Constitutional:      General: He is not in acute distress. Musculoskeletal:        General: No deformity.  Neurological:     Mental Status: He is alert and oriented to person, place, and time.     Coordination: Coordination normal.  Psychiatric:        Attention and Perception: Attention and perception normal. He does not perceive auditory or visual hallucinations.        Mood and Affect: Mood normal. Mood is not anxious or depressed. Affect is not labile, blunt, angry or inappropriate.        Speech: Speech normal.        Behavior: Behavior normal.        Thought Content: Thought content  normal. Thought content is not paranoid or delusional. Thought content does not include homicidal or suicidal ideation. Thought content does not include homicidal or suicidal plan.        Cognition and Memory: Cognition and memory normal.        Judgment: Judgment normal.     Comments: Insight intact    Lab Review:     Component Value Date/Time   NA 136 11/01/2020 1043   NA 137 02/18/2019 1549   K 4.1 11/01/2020 1043   CL 103 11/01/2020 1043   CO2 23 11/01/2020 1043   GLUCOSE 113 (H) 11/01/2020 1043   BUN 21 11/01/2020 1043  BUN 21 02/18/2019 1549   CREATININE 0.92 11/01/2020 1043   CREATININE 0.88 01/02/2017 0953   CALCIUM 9.3 11/01/2020 1043   PROT 7.3 11/01/2020 1043   ALBUMIN 3.9 11/01/2020 1043   AST 34 11/01/2020 1043   ALT 42 11/01/2020 1043   ALKPHOS 33 (L) 11/01/2020 1043   BILITOT 1.4 (H) 11/01/2020 1043   GFRNONAA >60 11/01/2020 1043   GFRAA >60 02/25/2020 1254       Component Value Date/Time   WBC 9.4 11/01/2020 1043   RBC 5.50 11/01/2020 1043   HGB 18.2 (H) 11/01/2020 1043   HGB 17.4 02/18/2019 1549   HCT 52.8 (H) 11/01/2020 1043   HCT 48.6 02/18/2019 1549   PLT 184 11/01/2020 1043   PLT 170 02/18/2019 1549   MCV 96.0 11/01/2020 1043   MCV 90 02/18/2019 1549   MCH 33.1 11/01/2020 1043   MCHC 34.5 11/01/2020 1043   RDW 13.2 11/01/2020 1043   RDW 12.5 02/18/2019 1549   LYMPHSABS 1.3 01/03/2020 0910   MONOABS 0.5 01/03/2020 0910   EOSABS 0.1 01/03/2020 0910   BASOSABS 0.0 01/03/2020 0910    No results found for: POCLITH, LITHIUM   No results found for: PHENYTOIN, PHENOBARB, VALPROATE, CBMZ   .res Assessment: Plan:     Plan:  PDMP reviewed  1. Zoloft 100mg  daily - stopped taking 2 months - plans to restart 2. Ambien CR 12.5mg  daily 3. Chlorazepate 7.5mg  daily  RTC 6 months  Patient advised to contact office with any questions, adverse effects, or acute worsening in signs and symptoms.  Discussed potential benefits, risk, and side  effects of benzodiazepines to include potential risk of tolerance and dependence, as well as possible drowsiness.  Advised patient not to drive if experiencing drowsiness and to take lowest possible effective dose to minimize risk of dependence and tolerance.   Diagnoses and all orders for this visit:  Major depressive disorder, recurrent episode, moderate (HCC)  Generalized anxiety disorder -     clorazepate (TRANXENE) 7.5 MG tablet; TAKE 1 TABLET(7.5 MG) BY MOUTH DAILY  Insomnia, unspecified type -     zolpidem (AMBIEN CR) 12.5 MG CR tablet; Take 1 tablet (12.5 mg total) by mouth at bedtime.    Please see After Visit Summary for patient specific instructions.  No future appointments.  No orders of the defined types were placed in this encounter.   -------------------------------

## 2021-03-05 ENCOUNTER — Encounter (INDEPENDENT_AMBULATORY_CARE_PROVIDER_SITE_OTHER): Payer: Self-pay | Admitting: Ophthalmology

## 2021-03-05 ENCOUNTER — Ambulatory Visit (INDEPENDENT_AMBULATORY_CARE_PROVIDER_SITE_OTHER): Payer: Medicare Other | Admitting: Ophthalmology

## 2021-03-05 ENCOUNTER — Other Ambulatory Visit: Payer: Self-pay

## 2021-03-05 DIAGNOSIS — H353113 Nonexudative age-related macular degeneration, right eye, advanced atrophic without subfoveal involvement: Secondary | ICD-10-CM

## 2021-03-05 DIAGNOSIS — H2512 Age-related nuclear cataract, left eye: Secondary | ICD-10-CM | POA: Diagnosis not present

## 2021-03-05 DIAGNOSIS — H353221 Exudative age-related macular degeneration, left eye, with active choroidal neovascularization: Secondary | ICD-10-CM | POA: Diagnosis not present

## 2021-03-05 MED ORDER — BEVACIZUMAB 2.5 MG/0.1ML IZ SOSY
2.5000 mg | PREFILLED_SYRINGE | INTRAVITREAL | Status: AC | PRN
  Administered 2021-03-05: 2.5 mg via INTRAVITREAL

## 2021-03-05 NOTE — Assessment & Plan Note (Signed)
Stable OD, no active CNVM

## 2021-03-05 NOTE — Progress Notes (Signed)
03/05/2021     CHIEF COMPLAINT Patient presents for Retina Follow Up (3 months fu OU and Avastin OS/Pt states, "I feel like my va is better. I had cat sx OD and I am happy with that and will be having my OS done at the end of this month."/Pt states, "I am suppose to be using Simbrinza in my OS but I have slacked off on that."//)   HISTORY OF PRESENT ILLNESS: [name] is a 70 y.o. male who presents to the clinic today for:   HPI     Retina Follow Up           Diagnosis: Wet AMD   Laterality: both eyes   Onset: 3 months ago   Severity: mild   Duration: 3 months   Course: stable   Comments: 3 months fu OU and Avastin OS Pt states, "I feel like my va is better. I had cat sx OD and I am happy with that and will be having my OS done at the end of this month." Pt states, "I am suppose to be using Simbrinza in my OS but I have slacked off on that."         Last edited by Kendra Opitz, COA on 03/05/2021 10:08 AM.      Referring physician: Tonia Ghent, MD [place],  [place] [id number]  And Dr. Delton Prairie   HISTORICAL INFORMATION:   Selected notes from the [place]: Current Outpatient Medications (Ophthalmic Drugs)  Medication Sig   PROLENSA 0.07 % SOLN Place 1 drop into the right eye See admin instructions.   SIMBRINZA 1-0.2 % SUSP Place 1 drop into both eyes 3 (three) times daily.   No current facility-administered medications for this visit. (Ophthalmic Drugs)   Current Outpatient Medications (Other)  Medication Sig   clorazepate (TRANXENE) 7.5 MG tablet TAKE 1 TABLET(7.5 MG) BY MOUTH DAILY   HYDROcodone-acetaminophen (NORCO/VICODIN) 5-325 MG tablet Take 1 tablet by mouth every 6 (six) hours as needed for moderate pain.   ibuprofen (ADVIL,MOTRIN) 200 MG tablet Take 100-200 mg by mouth daily as needed for mild pain.   levothyroxine (SYNTHROID) 25 MCG tablet Take 1 tablet (25 mcg total) by mouth  daily before breakfast.   Multiple Vitamin (MULTIVITAMIN WITH MINERALS) TABS tablet Take 1 tablet by mouth daily.   pantoprazole (PROTONIX) 40 MG tablet TAKE 1 TABLET(40 MG) BY MOUTH EVERY MORNING 30 MINUTES BEFORE BREAKFAST   rivaroxaban (XARELTO) 20 MG TABS tablet Take 1 tablet (20 mg total) by mouth daily with supper. Appointment needed for further refills   sertraline (ZOLOFT) 100 MG tablet Take 1 tablet (100 mg total) by mouth daily. (Patient taking differently: Take 100 mg by mouth daily as needed (for mood).)   zolpidem (AMBIEN CR) 12.5 MG CR tablet Take 1 tablet (12.5 mg total) by mouth at bedtime.   No current facility-administered medications for this visit. (Other)      REVIEW OF SYSTEMS:    ALLERGIES Allergies  Allergen Reactions   Crestor [Rosuvastatin]     Mood changes   Escitalopram Oxalate     Mood changes, irritablity   Latex Other (See Comments)    Blood in urine with latex catheter- but this was likely from mechanical irritation, not from the latex itself   Sulfonamide Derivatives Rash    PAST MEDICAL HISTORY Past Medical History:  Diagnosis Date   Alcohol abuse,  unspecified    history of   Anxiety    Arthritis    all over   Bladder cancer [facility]) 2011   Dr. Janice Norrie   CAD (coronary artery disease) 2012   MI   Depression    Dyslipidemia    GERD (gastroesophageal reflux disease)    History of DVT (deep vein thrombosis) 11/08 and again in 2014   previously on coumadin   Hypertension    Hypothyroidism    Inguinal hernia without mention of obstruction or gangrene, unilateral or unspecified, (not specified as recurrent)    Internal hemorrhoids without mention of complication    Myocardial infarction [facility])    2011    Other abnormal glucose    Personal history of other disorder of urinary system    Pneumonia    hx of 12/2019    Pulmonary embolism (HCC)    hx of    Stroke ([place]) 05/25/2005   TIA   Thrombocytopenia, unspecified ([place])    TIA (transient  ischemic attack) 2008   Past Surgical History:  Procedure Laterality Date   CARDIAC CATHETERIZATION  05/2011   mild atherosclerosis   CYSTOSCOPY  07/12/2011   Procedure: CYSTOSCOPY;  Surgeon: Hanley Ben, MD;  Location: [facility];  Service: Urology;  Laterality: N/A;  one hour being requested for this case   EYE SURGERY Left    injection from wet AMD (age related macular degeneration)   HERNIA REPAIR  8/11   repair of right indirect and direct hernias; incarerated umbilical hernia containing preperitoneal fat   INGUINAL HERNIA REPAIR Left 11/14/2020   Procedure: OPEN LEFT INGUINAL HERNIA REPAIR WITH MESH;  Surgeon: Johnathan Hausen, MD;  Location: [facility];  Service: General;  Laterality: Left;  90 MIN   SHOULDER SURGERY  03/08/08   Left; arthroscopy   TONSILLECTOMY     as child   TRANSURETHRAL RESECTION OF BLADDER TUMOR  07/12/2011   Procedure: TRANSURETHRAL RESECTION OF BLADDER TUMOR (TURBT);  Surgeon: Hanley Ben, MD;  Location: [facility];  Service: Urology;  Laterality: N/A;    FAMILY HISTORY Family History  Problem Relation Age of Onset   Other Father        Hypotension from medication causing GI bleed   Heart failure Father    Emphysema Mother    Prostate cancer Neg Hx     SOCIAL HISTORY Social History   Tobacco Use   Smoking status: Former    Packs/day: 1.50    Years: 25.00    Pack years: 37.50    Types: Cigarettes    Quit date: 08/26/2004    Years since quitting: 16.5   Smokeless tobacco: Never  Vaping Use   Vaping Use: Never used  Substance Use Topics   Alcohol use: No    Alcohol/week: 0.0 standard drinks    Comment: Quit 2009   Drug use: Not Currently    Frequency: 2.0 times per week    Types: Marijuana    Comment: MJ-regular use  10/2013 no longer uses         OPHTHALMIC EXAM:  Base Eye Exam     Visual Acuity (ETDRS)       Right Left   Dist [place] 20/40 20/200   Dist ph [place] 20/25 +1 NI         Tonometry (Tonopen, 10:13 AM)       Right Left    Pressure 15 23         Pupils       Pupils  Dark Light Shape React APD   Right PERRL 4 3 Round Brisk None   Left PERRL 4 3 Round Brisk None         Visual Fields       Left Right     Full   Restrictions Partial outer inferior nasal deficiency          Neuro/Psych     Oriented x3: Yes   Mood/Affect: Agitated         Dilation     Both eyes: 1.0% Mydriacyl, 2.5% Phenylephrine @ 10:13 AM           Slit Lamp and Fundus Exam     External Exam       Right Left   External Normal Normal         Slit Lamp Exam       Right Left   Lids/Lashes Normal Normal   Conjunctiva/Sclera White and quiet White and quiet   Cornea Clear Clear   Anterior Chamber Deep and quiet, by Rito Ehrlich Deep and quiet, Rito Ehrlich   Iris Round and reactive Round and reactive   Lens 2+ Nuclear sclerosis 2+ Nuclear sclerosis   Anterior Vitreous Normal Normal         Fundus Exam       Right Left   Posterior Vitreous Posterior vitreous detachment Posterior vitreous detachment   Disc Normal Normal   C/D Ratio 0.65 0.5   Macula  attached, para macular geographic atrophy not in the FAZ Geographic atrophy, Macular thickening, Hard drusen, Disciform scar   Vessels Normal Normal   Periphery  attached, Normal            IMAGING AND PROCEDURES  Imaging and Procedures for 03/05/21  OCT, Retina - OU - Both Eyes       Right Eye Quality was good. Scan locations included subfoveal. Central Foveal Thickness: 310. Progression has been stable. Findings include no IRF, no SRF, subretinal hyper-reflective material, outer retinal atrophy, central retinal atrophy.   Left Eye Quality was good. Scan locations included subfoveal. Central Foveal Thickness: 288. Progression has been stable. Findings include abnormal foveal contour, central retinal atrophy, outer retinal atrophy, no IRF, no SRF, subretinal scarring, disciform scar.   Notes OD with subretinal hyper reflective material, yet no  active intraretinal fluid nor CME.  OS with foveal atrophy secondary to resolution of CNVM with residual scarring from disciform scar and subretinal Hyper reflective material, stable with no recurrences at 63-month follow-up       Intravitreal Injection, Pharmacologic Agent - OS - Left Eye       Time Out 03/05/2021. 10:44 AM. Confirmed correct patient, procedure, site, and patient consented.   Anesthesia Topical anesthesia was used. Anesthetic medications included Akten 3.5%.   Procedure Preparation included 10% betadine to eyelids, 5% betadine to ocular surface, Tobramycin 0.3%. A 30 gauge needle was used.   Injection: 2.5 mg bevacizumab 2.5 MG/0.1ML   Route: Intravitreal, Site: Left Eye   NDC: [phone], Lot: [id number]   Post-op Post injection exam found visual acuity of at least counting fingers. The patient tolerated the procedure well. There were no complications. The patient received written and verbal post procedure care education. Post injection medications were not given.              ASSESSMENT/PLAN:  Cataract, nuclear sclerotic, left eye Okay to continue with planned cataract extraction with intraocular lens placement left eye under direction of Dr. Wyatt Portela  Exudative age-related macular  degeneration of left eye with active choroidal neovascularization (HCC) Stable white disciform scar at 3 months.  We will plan injection today to maintain and follow-up next in 4 months with observation planned  Advanced nonexudative age-related macular degeneration of right eye without subfoveal involvement Stable OD, no active CNVM     ICD-10-CM   1. Exudative age-related macular degeneration of left eye with active choroidal neovascularization (HCC)  H35.3221 OCT, Retina - OU - Both Eyes    Intravitreal Injection, Pharmacologic Agent - OS - Left Eye    bevacizumab (AVASTIN) SOSY 2.5 mg    2. Cataract, nuclear sclerotic, left eye  H25.12     3. Advanced  nonexudative age-related macular degeneration of right eye without subfoveal involvement  H35.3113       1.  OS with controlled CNVM subfoveal accounts for acuity.  No signs of active disease now at 31-month follow-up post Avastin.  We will repeat injection today and examination next in 4 months dilate OU, no planned injection OS  2.  OS with mild to moderate cataract do recommend proceeding with cataract to balance the 2 eyes  3.  OD looks great after recent cataract extraction with lens placement  Ophthalmic Meds Ordered this visit:  Meds ordered this encounter  Medications   bevacizumab (AVASTIN) SOSY 2.5 mg       Return in about 4 months (around 07/06/2021) for DILATE OU, OCT.  There are no Patient Instructions on file for this visit.   Explained the diagnoses, plan, and follow up with the patient and they expressed understanding.  Patient expressed understanding of the importance of proper follow up care.   Clent Demark Dandrea Widdowson M.D. Diseases & Surgery of the Retina and Vitreous Retina & Diabetic [place] 03/05/21     Abbreviations: M myopia (nearsighted); A astigmatism; H hyperopia (farsighted); P presbyopia; Mrx spectacle prescription;  CTL contact lenses; OD right eye; OS left eye; OU both eyes  XT exotropia; ET esotropia; PEK punctate epithelial keratitis; PEE punctate epithelial erosions; DES dry eye syndrome; MGD meibomian gland dysfunction; ATs artificial tears; PFAT's preservative free artificial tears; [place] nuclear sclerotic cataract; PSC posterior subcapsular cataract; ERM epi-retinal membrane; PVD posterior vitreous detachment; RD retinal detachment; DM diabetes mellitus; DR diabetic retinopathy; NPDR non-proliferative diabetic retinopathy; PDR proliferative diabetic retinopathy; CSME clinically significant macular edema; DME diabetic macular edema; dbh dot blot hemorrhages; CWS cotton wool spot; POAG primary open angle glaucoma; C/D cup-to-disc ratio; HVF humphrey visual  field; GVF goldmann visual field; OCT optical coherence tomography; IOP intraocular pressure; BRVO Branch retinal vein occlusion; CRVO central retinal vein occlusion; CRAO central retinal artery occlusion; BRAO branch retinal artery occlusion; RT retinal tear; SB scleral buckle; PPV pars plana vitrectomy; VH Vitreous hemorrhage; PRP panretinal laser photocoagulation; IVK intravitreal kenalog; VMT vitreomacular traction; MH Macular hole;  NVD neovascularization of the disc; NVE neovascularization elsewhere; AREDS age related eye disease study; ARMD age related macular degeneration; POAG primary open angle glaucoma; EBMD epithelial/anterior basement membrane dystrophy; ACIOL anterior chamber intraocular lens; IOL intraocular lens; PCIOL posterior chamber intraocular lens; Phaco/IOL phacoemulsification with intraocular lens placement; [place] photorefractive keratectomy; LASIK laser assisted in situ keratomileusis; HTN hypertension; DM diabetes mellitus; COPD chronic obstructive pulmonary disease

## 2021-03-05 NOTE — Assessment & Plan Note (Signed)
Stable white disciform scar at 3 months.  We will plan injection today to maintain and follow-up next in 4 months with observation planned

## 2021-03-05 NOTE — Assessment & Plan Note (Signed)
Okay to continue with planned cataract extraction with intraocular lens placement left eye under direction of Dr. Wyatt Portela

## 2021-04-10 ENCOUNTER — Other Ambulatory Visit: Payer: Self-pay | Admitting: Adult Health

## 2021-04-10 NOTE — Telephone Encounter (Signed)
Prescription refill request for Xarelto received.  Indication:venous thromboembolism Last office visit:needs visit Weight:90.5 kg Age:70 Scr:0.9 CrCl:99.16 ml/min  Prescription refilled

## 2021-05-03 ENCOUNTER — Ambulatory Visit: Payer: Medicare Other | Admitting: Family Medicine

## 2021-05-22 ENCOUNTER — Telehealth: Payer: Self-pay | Admitting: Family Medicine

## 2021-05-22 NOTE — Progress Notes (Signed)
  Chronic Care Management   Note  05/22/2021 Name: [name] MRN: [id number] DOB: [birth year]  [name] is a 70 y.o. year old male who is a primary care patient of Tonia Ghent, MD. I reached out to [name] by phone today in response to a referral sent by [name] PCP, Tonia Ghent, MD.   [name] was given information about Chronic Care Management services today including:  CCM service includes personalized support from designated clinical staff supervised by his physician, including individualized plan of care and coordination with other care providers 24/7 contact phone numbers for assistance for urgent and routine care needs. Service will only be billed when office clinical staff spend 20 minutes or more in a month to coordinate care. Only one practitioner may furnish and bill the service in a calendar month. The patient may stop CCM services at any time (effective at the end of the month) by phone call to the office staff.   Patient agreed to services and verbal consent obtained.   Follow up plan:   [place]jana Secretary/administrator

## 2021-06-05 ENCOUNTER — Other Ambulatory Visit: Payer: Self-pay | Admitting: Adult Health

## 2021-06-05 NOTE — Telephone Encounter (Signed)
Message sent to scheduling department to schedule pt an office visit.

## 2021-06-05 NOTE — Telephone Encounter (Signed)
Prescription refill request for Xarelto received.   Indication: Dvt,  Last office visit: 7/27/202 Weight: 90.5 kg  Age: 70 yo  Scr: 0.92, 11/01/2020 CrCl: 96 ml/min   Overdue for office visit.

## 2021-06-07 ENCOUNTER — Other Ambulatory Visit: Payer: Self-pay

## 2021-06-07 MED ORDER — RIVAROXABAN 20 MG PO TABS: ORAL_TABLET | ORAL | 0 refills | Status: DC

## 2021-06-13 ENCOUNTER — Other Ambulatory Visit: Payer: Self-pay

## 2021-06-13 ENCOUNTER — Ambulatory Visit: Payer: Medicare Other | Admitting: Urology

## 2021-06-13 ENCOUNTER — Telehealth: Payer: Self-pay | Admitting: Adult Health

## 2021-06-13 ENCOUNTER — Encounter: Payer: Self-pay | Admitting: Urology

## 2021-06-13 VITALS — BP 131/74 | HR 53 | Temp 98.5°F

## 2021-06-13 DIAGNOSIS — N4889 Other specified disorders of penis: Secondary | ICD-10-CM | POA: Diagnosis not present

## 2021-06-13 DIAGNOSIS — F411 Generalized anxiety disorder: Secondary | ICD-10-CM

## 2021-06-13 DIAGNOSIS — G4700 Insomnia, unspecified: Secondary | ICD-10-CM

## 2021-06-13 MED ORDER — CIPROFLOXACIN HCL 500 MG PO TABS
500.0000 mg | ORAL_TABLET | Freq: Once | ORAL | Status: DC
Start: 2021-06-13 — End: 2021-12-21

## 2021-06-13 MED ORDER — CLORAZEPATE DIPOTASSIUM 7.5 MG PO TABS: ORAL_TABLET | ORAL | 2 refills | Status: DC

## 2021-06-13 MED ORDER — ZOLPIDEM TARTRATE ER 12.5 MG PO TBCR: 12.5000 mg | EXTENDED_RELEASE_TABLET | Freq: Every day | ORAL | 2 refills | Status: DC

## 2021-06-13 NOTE — Telephone Encounter (Signed)
Pended.

## 2021-06-13 NOTE — Progress Notes (Signed)
Urological Symptom Review  Patient is experiencing the following symptoms: Erection problems (male only) Penile pain (male only)    Review of Systems  Gastrointestinal (upper)  : Negative for upper GI symptoms  Gastrointestinal (lower) : Negative for lower GI symptoms  Constitutional : Negative for symptoms  Skin: Negative for skin symptoms  Eyes: Negative for eye symptoms  Ear/Nose/Throat : Negative for Ear/Nose/Throat symptoms  Hematologic/Lymphatic: Negative for Hematologic/Lymphatic symptoms  Cardiovascular : Negative for cardiovascular symptoms  Respiratory : Negative for respiratory symptoms  Endocrine: Negative for endocrine symptoms  Musculoskeletal: Negative for musculoskeletal symptoms  Neurological: Negative for neurological symptoms  Psychologic: Negative for psychiatric symptoms

## 2021-06-13 NOTE — Patient Instructions (Signed)
Collagenase injection (Dupuytren's Contracture/Peyronie's Disease) What is this medication? COLLAGENASE (kohl LAH jen ace) is used to treat Dupuytren's contracture. This medicine may help straighten a bent finger by breaking up hard tissue. It is also used for Peyronie's disease by breaking up the hard tissue plaque that causes the curvature in the penis. This medicine may be used for other purposes; ask your health care provider or pharmacist if you have questions. COMMON BRAND NAME(S): Xiaflex What should I tell my care team before I take this medication? They need to know if you have any of these conditions: hemophilia low platelet counts take medicines that treat or prevent blood clots an unusual or allergic reaction to collagenase, other medicines, foods, dyes, or preservatives pregnant or trying to get pregnant breast-feeding How should I use this medication? This medicine is for injection into the hand or penis. It is given by a health care professional in a hospital or clinic setting. A special MedGuide will be given to you by the pharmacist with each prescription and refill. Be sure to read this information carefully each time. Talk to your pediatrician regarding the use of this medicine in children. Special care may be needed. Overdosage: If you think you have taken too much of this medicine contact a poison control center or emergency room at once. NOTE: This medicine is only for you. Do not share this medicine with others. What if I miss a dose? It is important not to miss your dose. Call your doctor or health care professional if you are unable to keep an appointment. What may interact with this medication? aspirin and aspirin-like medicines certain medicines that treat or prevent blood clots like warfarin, enoxaparin, and dalteparin This list may not describe all possible interactions. Give your health care provider a list of all the medicines, herbs, non-prescription drugs, or  dietary supplements you use. Also tell them if you smoke, drink alcohol, or use illegal drugs. Some items may interact with your medicine. What should I watch for while using this medication? Your condition will be monitored carefully while you are receiving this medicine. If being treated for Dupuytren's contracture, return to your healthcare provider the day after your hand is injected. In the meantime, do not flex or extend the fingers of your hand that was injected. Do not touch your finger that was injected, and elevate your hand until bedtime. Do not perform activity with the injected hand until you are told that it is OK. Follow any instructions about wearing a splint or performing finger exercises. Also, call your healthcare provider if you get increasing redness or swelling in the hand, if you have numbness or tingling in the treated finger, or if you have trouble bending the finger after the swelling goes down. If being treated for Peyronie's disease, you will need to return to your healthcare provider for a manual procedure that will stretch and help straighten your penis. Also, your healthcare provider will show you how to gently stretch your penis at home. Do not resume sexual activity until you are told that it is okay. Follow instructions on when to return for follow-up visits. Immediately call your doctor if you have trouble stretching or straightening your penis, or if you have pain or other concerns. Immediately call your healthcare provider if you get a fever or chills. What side effects may I notice from receiving this medication? Side effects that you should report to your doctor or health care professional as soon as possible: allergic reactions like   skin rash, itching or hives, swelling of the face, lips, or tongue chest pain or palpitations infection (fever, chills, increasing redness or swelling at treated site) numbness or tingling in a treated hand popping sound or sensation in  an erect penis sudden back pain or trouble walking sudden loss of the ability to maintain an erection trouble breathing trouble urinating or blood in the urine unusual pain, swelling, or bruising of the penis Side effects that usually do not require medical attention (report to your doctor or health care professional if they continue or are bothersome): bleeding or bruising at site where injected irritation at site where injected mild pain or swelling at site where injected This list may not describe all possible side effects. Call your doctor for medical advice about side effects. You may report side effects to FDA at 1-800-FDA-1088. Where should I keep my medication? This drug is given in a hospital or clinic and will not be stored at home. NOTE: This sheet is a summary. It may not cover all possible information. If you have questions about this medicine, talk to your doctor, pharmacist, or health care provider.  2022 Elsevier/Gold Standard (2019-12-10 17:03:04)  

## 2021-06-13 NOTE — Progress Notes (Signed)
06/13/21  CC: followup bladder cancer   HPI: [name] is a 70yo here for followup for HG NMIBC diagnosed in 2011 and 2012. He had BCG at that time  Blood pressure 131/74, pulse (!) 53, temperature 98.5 F (36.9 C). NED. A&Ox3.   No respiratory distress   Abd soft, NT, ND Normal phallus with bilateral descended testicles  Cystoscopy Procedure Note  Patient identification was confirmed, informed consent was obtained, and patient was prepped using Betadine solution.  Lidocaine jelly was administered per urethral meatus.     Pre-Procedure: - Inspection reveals a normal caliber ureteral meatus.  Procedure: The flexible cystoscope was introduced without difficulty - No urethral strictures/lesions are present. - Enlarged prostate no median lobe - Normal bladder neck - Bilateral ureteral orifices identified - Bladder mucosa  reveals no ulcers, tumors, or lesions - No bladder stones - No trabeculation  Retroflexion shows no intravesical prostatic protrusion   Post-Procedure: - Patient tolerated the procedure well  Assessment/ Plan: RTC 2 years for cystoscopy   Nicolette Bang, MD     06/13/2021 9:57 AM   [name] 01/19/1951 [id number]  Referring provider: Tonia Ghent, MD [address],  [place] [address]  Penile curvature   HPI: [name] is a 70yo here for evaluation of penile curvature. He has noted curvature with erections starting 2 years ago and the curvature has been stable for 1 year. He notes dorsal curvature. He can feel a knot in the penis for the past year. He has intermittent pain with erections. Based on pictures provided by the patient he has 60-70 degree dorsal curvature at the mid penile shaft.    PMH: Past Medical History:  Diagnosis Date   Alcohol abuse, unspecified    history of   Anxiety    Arthritis    all over   Bladder cancer [facility]) 2011   Dr. Janice Norrie   CAD (coronary artery disease) 2012   MI    Depression    Dyslipidemia    GERD (gastroesophageal reflux disease)    History of DVT (deep vein thrombosis) 11/08 and again in 2014   previously on coumadin   Hypertension    Hypothyroidism    Inguinal hernia without mention of obstruction or gangrene, unilateral or unspecified, (not specified as recurrent)    Internal hemorrhoids without mention of complication    Myocardial infarction [facility])    2011    Other abnormal glucose    Personal history of other disorder of urinary system    Pneumonia    hx of 12/2019    Pulmonary embolism (HCC)    hx of    Stroke ([place]) 05/25/2005   TIA   Thrombocytopenia, unspecified ([place])    TIA (transient ischemic attack) 2008    Surgical History: Past Surgical History:  Procedure Laterality Date   CARDIAC CATHETERIZATION  05/2011   mild atherosclerosis   CYSTOSCOPY  07/12/2011   Procedure: CYSTOSCOPY;  Surgeon: Hanley Ben, MD;  Location: [facility];  Service: Urology;  Laterality: N/A;  one hour being requested for this case   EYE SURGERY Left    injection from wet AMD (age related macular degeneration)   HERNIA REPAIR  8/11   repair of right indirect and direct hernias; incarerated umbilical hernia containing preperitoneal fat   INGUINAL HERNIA REPAIR Left 11/14/2020   Procedure: OPEN LEFT INGUINAL HERNIA REPAIR WITH MESH;  Surgeon: Johnathan Hausen, MD;  Location: [facility];  Service: General;  Laterality: Left;  [place]  03/08/08   Left; arthroscopy   TONSILLECTOMY     as child   TRANSURETHRAL RESECTION OF BLADDER TUMOR  07/12/2011   Procedure: TRANSURETHRAL RESECTION OF BLADDER TUMOR (TURBT);  Surgeon: Hanley Ben, MD;  [facility];  Service: Urology;  Laterality: N/A;    Home Medications:  Allergies as of 06/13/2021       Reactions   Crestor [rosuvastatin]    Mood changes   Escitalopram Oxalate    Mood changes, irritablity   Latex Other (See Comments)   Blood in urine with latex catheter- but this was  likely from mechanical irritation, not from the latex itself   Sulfonamide Derivatives Rash        Medication List        Accurate as of June 13, 2021  9:57 AM. If you have any questions, ask your nurse or doctor.          clorazepate 7.5 MG tablet Commonly known as: TRANXENE TAKE 1 TABLET(7.5 MG) BY MOUTH DAILY   HYDROcodone-acetaminophen 5-325 MG tablet Commonly known as: NORCO/VICODIN Take 1 tablet by mouth every 6 (six) hours as needed for moderate pain.   ibuprofen 200 MG tablet Commonly known as: ADVIL Take 100-200 mg by mouth daily as needed for mild pain.   levothyroxine 25 MCG tablet Commonly known as: SYNTHROID Take 1 tablet (25 mcg total) by mouth daily before breakfast.   multivitamin with minerals Tabs tablet Take 1 tablet by mouth daily.   pantoprazole 40 MG tablet Commonly known as: PROTONIX TAKE 1 TABLET(40 MG) BY MOUTH EVERY MORNING 30 MINUTES BEFORE BREAKFAST   Prolensa 0.07 % Soln Generic drug: Bromfenac Sodium Place 1 drop into the right eye See admin instructions.   rivaroxaban 20 MG Tabs tablet Commonly known as: Xarelto TAKE 1 TABLET BY MOUTH EVERY DAY WITH SUPPER   sertraline 100 MG tablet Commonly known as: ZOLOFT Take 1 tablet (100 mg total) by mouth daily. What changed:  when to take this reasons to take this   Simbrinza 1-0.2 % Susp Generic drug: Brinzolamide-Brimonidine Place 1 drop into both eyes 3 (three) times daily.   zolpidem 12.5 MG CR tablet Commonly known as: AMBIEN CR Take 1 tablet (12.5 mg total) by mouth at bedtime.        Allergies:  Allergies  Allergen Reactions   Crestor [Rosuvastatin]     Mood changes   Escitalopram Oxalate     Mood changes, irritablity   Latex Other (See Comments)    Blood in urine with latex catheter- but this was likely from mechanical irritation, not from the latex itself   Sulfonamide Derivatives Rash    Family History: Family History  Problem Relation Age of Onset    Other Father        Hypotension from medication causing GI bleed   Heart failure Father    Emphysema Mother    Prostate cancer Neg Hx     Social History:  reports that he quit smoking about 16 years ago. His smoking use included cigarettes. He has a 37.50 pack-year smoking history. He has never used smokeless tobacco. He reports that he does not currently use drugs after having used the following drugs: Marijuana. Frequency: 2.00 times per week. He reports that he does not drink alcohol.  ROS: All other review of systems were reviewed and are negative except what is noted above in HPI  Physical Exam: BP 131/74   Pulse (!) 53  Temp 98.5 F (36.9 C)   Constitutional:  Alert and oriented, No acute distress. HEENT: [place] AT, moist mucus membranes.  Trachea midline, no masses. Cardiovascular: No clubbing, cyanosis, or edema. Respiratory: Normal respiratory effort, no increased work of breathing. GI: Abdomen is soft, nontender, nondistended, no abdominal masses GU: No CVA tenderness. Circumcised phallus. No masses/lesions on penis, testis, scrotum. 2cm dorsal mid penile shaft palpable peyronies plaque Lymph: No cervical or inguinal lymphadenopathy Skin: No rashes, bruises or suspicious lesions. Neurologic: Grossly intact, no focal deficits, moving all 4 extremities. Psychiatric: Normal mood and affect.  Laboratory Data: Lab Results  Component Value Date   WBC 9.4 11/01/2020   HGB 18.2 (H) 11/01/2020   HCT 52.8 (H) 11/01/2020   MCV 96.0 11/01/2020   PLT 184 11/01/2020    Lab Results  Component Value Date   CREATININE 0.92 11/01/2020    Lab Results  Component Value Date   PSA 0.87 08/29/2008    No results found for: TESTOSTERONE  No results found for: HGBA1C  Urinalysis    Component Value Date/Time   COLORURINE YELLOW 11/01/2020 1552   APPEARANCEUR CLEAR 11/01/2020 1552   LABSPEC 1.023 11/01/2020 1552   PHURINE 6.0 11/01/2020 1552   GLUCOSEU NEGATIVE 11/01/2020 1552    HGBUR NEGATIVE 11/01/2020 1552   HGBUR large 05/01/2010 1453   BILIRUBINUR NEGATIVE 11/01/2020 1552   KETONESUR 5 (A) 11/01/2020 1552   PROTEINUR NEGATIVE 11/01/2020 1552   UROBILINOGEN 0.2 05/01/2010 1453   NITRITE NEGATIVE 11/01/2020 1552   LEUKOCYTESUR NEGATIVE 11/01/2020 1552    Lab Results  Component Value Date   BACTERIA RARE 02/10/2009    Pertinent Imaging:  Results for orders placed in visit on 11/14/14  DG Abd 1 View  Narrative CLINICAL DATA:  Left flank pain for 3 days.  EXAM: ABDOMEN - 1 VIEW  COMPARISON:  CT scan dated 06/07/2010  FINDINGS: There are no visible renal or ureteral or bladder calculi. There is minimal air in nondistended loops of bowel. Psoas margins are distinct bilaterally. Chronic rotoscoliosis with multilevel degenerative disc and joint disease, unchanged. Arterial vascular calcifications in the pelvis.  IMPRESSION: Benign appearing abdomen. Specifically, no visible renal or ureteral or bladder calculi.   Electronically Signed By: Lorriane Shire M.D. On: 11/14/2014 15:24  No results found for this or any previous visit.  No results found for this or any previous visit.  No results found for this or any previous visit.  No results found for this or any previous visit.  No results found for this or any previous visit.  No results found for this or any previous visit.  No results found for this or any previous visit.   Assessment & Plan:    1. Peyronies disease We discussed the management of peyronies disease including medical therapy, penile plication, verapamil therapy and xiaflex therapy. After discussed the options the patient elects for xiaflex therapy.   - Urinalysis, Routine w reflex microscopic - ciprofloxacin (CIPRO) tablet 500 mg   No follow-ups on file.  Nicolette Bang, MD  [facility] [place]

## 2021-06-13 NOTE — Telephone Encounter (Signed)
Pt called requesting Rx for Clorazepate and Zolpidem @ [facility] [address].

## 2021-06-28 NOTE — Progress Notes (Deleted)
Cardiology Office Note   Date:  06/28/2021   ID:  [name], DOB 1951/07/12, MRN [id number]  PCP:  Tonia Ghent, MD  Cardiologist: Dr. Sallyanne Kuster  No chief complaint on file.    History of Present Illness: [name] is a 69 y.o. male who presents for ongoing assessment and management of minor coronary artery disease which was found by cardiac catheterization in 2012, recurrent DVT of the lower extremities on lifelong Xarelto, dyslipidemia, history of TIA, chronic peripheral venous insufficiency.  He also has a history of anxiety and depression.  The patient has had a ZIO cardiac monitor in 2020 which revealed occasional monoblastic PVCs in a pattern of bigeminy and was started on metoprolol 12.5 mg daily.  He did have improvement of symptoms of palpitations.  On last office visit 04/26/2019, he was doing well without any further complaints of palpitations.  At that time he had also been started on sertraline by PCP and he was feeling better concerning his depressive symptoms.  He was offered psychiatric counseling referral but he refused at that time.  Review of his medical records finds that he has been seen by psychiatry on 02/08/2021 and had stopped taking sertraline at that time.  He was restarted at 100 mg daily along with Ambien and clorazepate.    Past Medical History:  Diagnosis Date   Alcohol abuse, unspecified    history of   Anxiety    Arthritis    all over   Bladder cancer [facility]) 2011   Dr. Janice Norrie   CAD (coronary artery disease) 2012   MI   Depression    Dyslipidemia    GERD (gastroesophageal reflux disease)    History of DVT (deep vein thrombosis) 11/08 and again in 2014   previously on coumadin   Hypertension    Hypothyroidism    Inguinal hernia without mention of obstruction or gangrene, unilateral or unspecified, (not specified as recurrent)    Internal hemorrhoids without mention of complication    Myocardial infarction [facility])    2011    Other abnormal  glucose    Personal history of other disorder of urinary system    Pneumonia    hx of 12/2019    Pulmonary embolism ([place])    hx of    Stroke ([place]) 05/25/2005   TIA   Thrombocytopenia, unspecified ([place])    TIA (transient ischemic attack) 2008    Past Surgical History:  Procedure Laterality Date   CARDIAC CATHETERIZATION  05/2011   mild atherosclerosis   CYSTOSCOPY  07/12/2011   Procedure: CYSTOSCOPY;  Surgeon: Hanley Ben, MD;  [facility];  Service: Urology;  Laterality: N/A;  one hour being requested for this case   EYE SURGERY Left    injection from wet AMD (age related macular degeneration)   HERNIA REPAIR  8/11   repair of right indirect and direct hernias; incarerated umbilical hernia containing preperitoneal fat   INGUINAL HERNIA REPAIR Left 11/14/2020   Procedure: OPEN LEFT INGUINAL HERNIA REPAIR WITH MESH;  Surgeon: Johnathan Hausen, MD;  [facility]: [facility];  Service: General;  Laterality: Left;  90 MIN   SHOULDER SURGERY  03/08/08   Left; arthroscopy   TONSILLECTOMY     as child   TRANSURETHRAL RESECTION OF BLADDER TUMOR  07/12/2011   Procedure: TRANSURETHRAL RESECTION OF BLADDER TUMOR (TURBT);  Surgeon: Hanley Ben, MD;  [facility];  Service: Urology;  Laterality: N/A;     Current Outpatient Medications  Medication Sig Dispense  Refill   clorazepate (TRANXENE) 7.5 MG tablet TAKE 1 TABLET(7.5 MG) BY MOUTH DAILY 30 tablet 2   HYDROcodone-acetaminophen (NORCO/VICODIN) 5-325 MG tablet Take 1 tablet by mouth every 6 (six) hours as needed for moderate pain. 15 tablet 0   ibuprofen (ADVIL,MOTRIN) 200 MG tablet Take 100-200 mg by mouth daily as needed for mild pain.     levothyroxine (SYNTHROID) 25 MCG tablet Take 1 tablet (25 mcg total) by mouth daily before breakfast. 90 tablet 1   Multiple Vitamin (MULTIVITAMIN WITH MINERALS) TABS tablet Take 1 tablet by mouth daily.     pantoprazole (PROTONIX) 40 MG tablet TAKE 1 TABLET(40 MG) BY MOUTH EVERY MORNING 30  MINUTES BEFORE BREAKFAST 30 tablet 11   PROLENSA 0.07 % SOLN Place 1 drop into the right eye See admin instructions.     rivaroxaban (XARELTO) 20 MG TABS tablet TAKE 1 TABLET BY MOUTH EVERY DAY WITH SUPPER 15 tablet 0   sertraline (ZOLOFT) 100 MG tablet Take 1 tablet (100 mg total) by mouth daily. (Patient taking differently: Take 100 mg by mouth daily as needed (for mood).)     SIMBRINZA 1-0.2 % SUSP Place 1 drop into both eyes 3 (three) times daily.     zolpidem (AMBIEN CR) 12.5 MG CR tablet Take 1 tablet (12.5 mg total) by mouth at bedtime. 30 tablet 2   Current Facility-Administered Medications  Medication Dose Route Frequency Provider Last Rate Last Admin   ciprofloxacin (CIPRO) tablet 500 mg  500 mg Oral Once Cleon Gustin, MD        Allergies:   Crestor [rosuvastatin], Escitalopram oxalate, Latex, and Sulfonamide derivatives    Social History:  The patient  reports that he quit smoking about 16 years ago. His smoking use included cigarettes. He has a 37.50 pack-year smoking history. He has never used smokeless tobacco. He reports that he does not currently use drugs after having used the following drugs: Marijuana. Frequency: 2.00 times per week. He reports that he does not drink alcohol.   Family History:  The patient's family history includes Emphysema in his mother; Heart failure in his father; Other in his father.    ROS: All other systems are reviewed and negative. Unless otherwise mentioned in H&P    PHYSICAL EXAM: VS:  There were no vitals taken for this visit. , BMI There is no height or weight on file to calculate BMI. GEN: Well nourished, well developed, in no acute distress HEENT: normal Neck: no JVD, carotid bruits, or masses Cardiac: ***RRR; no murmurs, rubs, or gallops,no edema  Respiratory:  Clear to auscultation bilaterally, normal work of breathing GI: soft, nontender, nondistended, + BS MS: no deformity or atrophy Skin: warm and dry, no rash Neuro:   Strength and sensation are intact Psych: euthymic mood, full affect   EKG:  EKG {ACTION; IS/IS [id number]} ordered today. The ekg ordered today demonstrates ***   Recent Labs: 11/01/2020: ALT 42; BUN 21; Creatinine, Ser 0.92; Hemoglobin 18.2; Magnesium 2.1; Platelets 184; Potassium 4.1; Sodium 136; TSH 1.635    Lipid Panel    Component Value Date/Time   CHOL 174 01/03/2020 0910   TRIG 93.0 01/03/2020 0910   HDL 39.90 01/03/2020 0910   CHOLHDL 4 01/03/2020 0910   VLDL 18.6 01/03/2020 0910   LDLCALC 116 (H) 01/03/2020 0910   LDLDIRECT 158.7 08/29/2008 1235      Wt Readings from Last 3 Encounters:  11/27/20 199 lb 8 oz (90.5 kg)  11/14/20 198 lb (89.8 kg)  11/01/20 200 lb (90.7 kg)      Other studies Reviewed: Zio Monitor 7-day 03/17/2019 Dominant rhythm is normal sinus rhythm with normal circvadian rhythm. Occasional monomorphic PVCs are seen, often in a pattern of bigeminy. No evidence of ventricular tachycardia or atrial fibrillation. Symptoms appear to coincide with periods of ventricular bigeminy.   Occasional symptomatic PVCs in a pattern of bigeminy. Otherwise normal arrhythmia monitor. ASSESSMENT AND PLAN:  1.  ***   Current medicines are reviewed at length with the patient today.  I have spent *** dedicated to the care of this patient on the date of this encounter to include pre-visit review of records, assessment, management and diagnostic testing,with shared decision making.  Labs/ tests ordered today include: *** Phill Myron. [place] Pugh, ANP, AACC   06/28/2021 5:11 PM    [facility] [address] Office [phone] Fax [phone]  Notice: This dictation was prepared with Dragon dictation along with smaller phrase technology. Any transcriptional errors that result from this process are unintentional and may not be corrected upon review.

## 2021-06-29 ENCOUNTER — Ambulatory Visit: Payer: Medicare Other | Admitting: Adult Health

## 2021-07-06 ENCOUNTER — Telehealth: Payer: Self-pay

## 2021-07-06 NOTE — Chronic Care Management (AMB) (Signed)
Chronic Care Management Pharmacy Assistant   Name: [name]  MRN: [id number] DOB: 03-17-1951  [name] is an 70 y.o. year old male who presents for his initial CCM visit with the clinical pharmacist.  Reason for Encounter: Initial Questions   Conditions to be addressed/monitored: CAD   Recent office visits:  11/27/20-Family Medicine-Spencer Copland,MD- Patient presented for right elbow pain.xrays and instruction of night time splint,elbow pad for resting elbow.   Recent consult visits:  06/13/21-Urology-Patrick McKenzie,MD-Patient presented for cyctoscopy-collagenase injection. 03/05/21-Ophthalmology-Gary Rankin,MD-Patient presented for retina follow up.Avastin 2.5mg  injection,follow up 4 months 02/08/21-Behavioral Health-Regina Mozingo,NP- no data found  Hospital visits:  None in previous 6 months  Medications: Outpatient Encounter Medications as of 07/06/2021  Medication Sig Note   clorazepate (TRANXENE) 7.5 MG tablet TAKE 1 TABLET(7.5 MG) BY MOUTH DAILY    HYDROcodone-acetaminophen (NORCO/VICODIN) 5-325 MG tablet Take 1 tablet by mouth every 6 (six) hours as needed for moderate pain.    ibuprofen (ADVIL,MOTRIN) 200 MG tablet Take 100-200 mg by mouth daily as needed for mild pain.    levothyroxine (SYNTHROID) 25 MCG tablet Take 1 tablet (25 mcg total) by mouth daily before breakfast.    Multiple Vitamin (MULTIVITAMIN WITH MINERALS) TABS tablet Take 1 tablet by mouth daily.    pantoprazole (PROTONIX) 40 MG tablet TAKE 1 TABLET(40 MG) BY MOUTH EVERY MORNING 30 MINUTES BEFORE BREAKFAST    PROLENSA 0.07 % SOLN Place 1 drop into the right eye See admin instructions.    rivaroxaban (XARELTO) 20 MG TABS tablet TAKE 1 TABLET BY MOUTH EVERY DAY WITH SUPPER    sertraline (ZOLOFT) 100 MG tablet Take 1 tablet (100 mg total) by mouth daily. (Patient taking differently: Take 100 mg by mouth daily as needed (for mood).)    SIMBRINZA 1-0.2 % SUSP Place 1 drop into both eyes 3  (three) times daily. 11/01/2020: The patient stated he is using only 1 prescribed eye drop at this time   zolpidem (AMBIEN CR) 12.5 MG CR tablet Take 1 tablet (12.5 mg total) by mouth at bedtime.    Facility-Administered Encounter Medications as of 07/06/2021  Medication   ciprofloxacin (CIPRO) tablet 500 mg    No results found for: HGBA1C, MICROALBUR   BP Readings from Last 3 Encounters:  06/13/21 131/74  11/27/20 128/66  11/14/20 135/85    Patient contacted to review initial questions prior to visit with Charlene Brooke.  Have you seen any other providers since your last visit with PCP? Yes  Urology and Ophthalmology, no medication changes.  Any changes in your medications or health? Yes The patient reports he is having upper respiratory issues for the last 3 days and is taking tylenol and using nasal spray at this time.   Any side effects from any medications? No  Do you have an symptoms or problems not managed by your medications? No  Any concerns about your health right now? No  Has your provider asked that you check blood pressure, blood sugar, or follow special diet at home? No  Do you get any type of exercise on a regular basis? 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 No  Do you have any problems getting your medications? No, [facility]    Is there anything that you would like to discuss during the appointment? No   Spoke  with patient and reminded them to have all medications, supplements and any blood glucose and blood pressure readings available for review with pharmacist, at their telephone visit on 07/13/21 at 11:00am.   Star Rating Drugs:  Medication:  Last Fill: Day Supply None identified  Xarelto 20mg   06/19/21 15  Care Gaps: Annual wellness visit in last year? No Most Recent BP reading:  131/74  53-P  06/13/21    Marjo Bicker, CPP notified  Avel Sensor, [place] Assistant [phone]  Total time spent for month CPA: 40 min

## 2021-07-09 ENCOUNTER — Encounter (INDEPENDENT_AMBULATORY_CARE_PROVIDER_SITE_OTHER): Payer: Medicare Other | Admitting: Ophthalmology

## 2021-07-09 ENCOUNTER — Other Ambulatory Visit: Payer: Self-pay | Admitting: Cardiovascular Disease

## 2021-07-13 ENCOUNTER — Telehealth: Payer: Medicare Other

## 2021-07-13 NOTE — Progress Notes (Deleted)
Chronic Care Management Pharmacy Note  07/13/2021 Name:  [name] MRN:  [id number] DOB:  Jul 29, 1951  Summary: ***  Recommendations/Changes made from today's visit: ***  Plan: ***   Subjective: [name] is an 70 y.o. year old male who is a primary patient of Damita Dunnings, Elveria Rising, MD.  The CCM team was consulted for assistance with disease management and care coordination needs.    Engaged with patient by telephone for initial visit in response to provider referral for pharmacy case management and/or care coordination services.   Consent to Services:  The patient was given the following information about Chronic Care Management services today, agreed to services, and gave verbal consent: 1. CCM service includes personalized support from designated clinical staff supervised by the primary care provider, including individualized plan of care and coordination with other care providers 2. 24/7 contact phone numbers for assistance for urgent and routine care needs. 3. Service will only be billed when office clinical staff spend 20 minutes or more in a month to coordinate care. 4. Only one practitioner may furnish and bill the service in a calendar month. 5.The patient may stop CCM services at any time (effective at the end of the month) by phone call to the office staff. 6. The patient will be responsible for cost sharing (co-pay) of up to 20% of the service fee (after annual deductible is met). Patient agreed to services and consent obtained.  Patient Care Team: Tonia Ghent, MD as PCP - General Croitoru, Dani Gobble, MD as PCP - Cardiology (Cardiology) Katy Fitch, Darlina Guys, MD as Consulting Physician (Ophthalmology) Charlton Haws, [facility] as Pharmacist (Pharmacist)  Recent office visits: 11/27/20-Family Medicine-Spencer Copland,MD- Patient presented for right elbow pain.xrays and instruction of night time splint,elbow pad for resting elbow.   10/02/20 Dr Damita Dunnings OV: f/u hernia,  rash; referred to surgery; stop triamcinolone, change to Lidex.  Recent consult visits: 06/13/21-Urology-Patrick McKenzie,MD-Patient presented for cytoscopy -collagenase injection. 03/05/21-Ophthalmology-Gary Rankin,MD-Patient presented for retina follow up. Avastin 2.60m injection,follow up 4 months 02/08/21-Behavioral Health-Regina Mozingo,NP- f/u depression, anxiety. Had stopped zoloft for 2 months, plans to restart.  Hospital visits: None in previous 6 months   Objective:  Lab Results  Component Value Date   CREATININE 0.92 11/01/2020   BUN 21 11/01/2020   GFR 90.69 01/03/2020   GFRNONAA >60 11/01/2020   GFRAA >60 02/25/2020   NA 136 11/01/2020   K 4.1 11/01/2020   CALCIUM 9.3 11/01/2020   CO2 23 11/01/2020   GLUCOSE 113 (H) 11/01/2020    Lab Results  Component Value Date/Time   GFR 90.69 01/03/2020 09:10 AM   GFR 93.45 10/01/2012 12:24 PM    Last diabetic Eye exam: No results found for: HMDIABEYEEXA  Last diabetic Foot exam: No results found for: HMDIABFOOTEX   Lab Results  Component Value Date   CHOL 174 01/03/2020   HDL 39.90 01/03/2020   LDLCALC 116 (H) 01/03/2020   LDLDIRECT 158.7 08/29/2008   TRIG 93.0 01/03/2020   CHOLHDL 4 01/03/2020    Hepatic Function Latest Ref Rng & Units 11/01/2020 01/03/2020 05/08/2019  Total Protein 6.5 - 8.1 g/dL 7.3 7.5 7.3  Albumin 3.5 - 5.0 g/dL 3.9 4.0 3.8  AST 15 - 41 U/L 34 28 36  ALT 0 - 44 U/L 42 27 33  Alk Phosphatase 38 - 126 U/L 33(L) 40 39  Total Bilirubin 0.3 - 1.2 mg/dL 1.4(H) 0.8 0.7  Bilirubin, Direct 0.0 - 0.3 mg/dL - - -    Lab  Results  Component Value Date/Time   TSH 1.635 11/01/2020 01:33 PM   TSH 7.17 (H) 01/03/2020 09:10 AM   TSH 6.02 (H) 03/01/2019 10:21 AM   FREET4 0.60 03/01/2019 10:21 AM    CBC Latest Ref Rng & Units 11/01/2020 02/25/2020 01/03/2020  WBC 4.0 - 10.5 K/uL 9.4 5.9 4.0  Hemoglobin 13.0 - 17.0 g/dL 18.2(H) 17.2(H) 16.5  Hematocrit 39.0 - 52.0 % 52.8(H) 50.8 47.9  Platelets 150 - 400 K/uL  184 160 145.0(L)    No results found for: VD25OH  Clinical ASCVD: Yes  The 10-year ASCVD risk score (Arnett DK, et al., 2019) is: 19.3%   Values used to calculate the score:     Age: 70 years     Sex: Male     Is Non-Hispanic African American: No     Diabetic: No     Tobacco smoker: No     Systolic Blood Pressure: 492 mmHg     Is BP treated: No     HDL Cholesterol: 39.9 mg/dL     Total Cholesterol: 174 mg/dL    Depression screen [facility] 2/9 08/04/2020 12/24/2019  Decreased Interest 0 0  Down, Depressed, Hopeless 0 0  PHQ - 2 Score 0 0  Altered sleeping - 0  Tired, decreased energy - 0  Change in appetite - 0  Feeling bad or failure about yourself  - 0  Trouble concentrating - 0  Moving slowly or fidgety/restless - 0  Suicidal thoughts - 0  PHQ-9 Score - 0  Difficult doing work/chores - Not difficult at all    GAD 7 : Generalized Anxiety Score 08/04/2020  Nervous, Anxious, on Edge 0  Control/stop worrying 0  Worry too much - different things 0  Trouble relaxing 0  Restless 0  Easily annoyed or irritable 0  Afraid - awful might happen 0  Total GAD 7 Score 0  Anxiety Difficulty Not difficult at all     Social History   Tobacco Use  Smoking Status Former   Packs/day: 1.50   Years: 25.00   Pack years: 37.50   Types: Cigarettes   Quit date: 08/26/2004   Years since quitting: 16.8  Smokeless Tobacco Never   BP Readings from Last 3 Encounters:  06/13/21 131/74  11/27/20 128/66  11/14/20 135/85   Pulse Readings from Last 3 Encounters:  06/13/21 (!) 53  11/27/20 66  11/14/20 (!) 50   Wt Readings from Last 3 Encounters:  11/27/20 199 lb 8 oz (90.5 kg)  11/14/20 198 lb (89.8 kg)  11/01/20 200 lb (90.7 kg)   BMI Readings from Last 3 Encounters:  11/27/20 25.61 kg/m  11/14/20 25.42 kg/m  11/01/20 25.68 kg/m    Assessment/Interventions: Review of patient past medical history, allergies, medications, health status, including review of consultants reports,  laboratory and other test data, was performed as part of comprehensive evaluation and provision of chronic care management services.   SDOH:  (Social Determinants of Health) assessments and interventions performed: Yes  SDOH Screenings   Alcohol Screen: Not on file  Depression (PHQ2-9): Low Risk    PHQ-2 Score: 0  Financial Resource Strain: Not on file  Food Insecurity: Not on file  Housing: Not on file  Physical Activity: Not on file  Social Connections: Not on file  Stress: Not on file  Tobacco Use: Medium Risk   Smoking Tobacco Use: Former   Smokeless Tobacco Use: Never   Passive Exposure: Not on file  Transportation Needs: Not on file  CCM Care Plan  Allergies  Allergen Reactions   Crestor [Rosuvastatin]     Mood changes   Escitalopram Oxalate     Mood changes, irritablity   Latex Other (See Comments)    Blood in urine with latex catheter- but this was likely from mechanical irritation, not from the latex itself   Sulfonamide Derivatives Rash    Medications Reviewed Today     Reviewed by Cleon Gustin, MD (Physician) on 06/13/21 at 1021  Med List Status: <None>   Medication Order Taking? Sig Documenting Provider Last Dose Status Informant  ciprofloxacin (CIPRO) tablet 500 mg [id number]   Cleon Gustin, MD  Active   clorazepate (TRANXENE) 7.5 MG tablet [id number]  TAKE 1 TABLET(7.5 MG) BY MOUTH DAILY Mozingo, Berdie Ogren, NP  Active   HYDROcodone-acetaminophen (NORCO/VICODIN) 5-325 MG tablet [id number] No Take 1 tablet by mouth every 6 (six) hours as needed for moderate pain. Johnathan Hausen, MD Taking Active   ibuprofen (ADVIL,MOTRIN) 200 MG tablet [id number] No Take 100-200 mg by mouth daily as needed for mild pain. [provider] Taking Active Self           Med Note Damita Dunnings, Iverson Alamin Dec 24, 2018  4:29 PM)    levothyroxine (SYNTHROID) 25 MCG tablet [id number] No Take 1 tablet (25 mcg total) by mouth daily before breakfast. Chase Picket, MD Taking Active Self  Multiple Vitamin (MULTIVITAMIN WITH MINERALS) TABS tablet [id number] No Take 1 tablet by mouth daily. [provider] Taking Active Self  pantoprazole (PROTONIX) 40 MG tablet [id number]  TAKE 1 TABLET(40 MG) BY MOUTH EVERY MORNING 30 MINUTES BEFORE BREAKFAST Esterwood, Amy S, PA-C  Active   PROLENSA 0.07 % SOLN [id number] No Place 1 drop into the right eye See admin instructions. [provider] Taking Active   rivaroxaban (XARELTO) 20 MG TABS tablet [id number]  TAKE 1 TABLET BY MOUTH EVERY DAY WITH SUPPER Croitoru, Mihai, MD  Active   sertraline (ZOLOFT) 100 MG tablet [id number] No Take 1 tablet (100 mg total) by mouth daily.  Patient taking differently: Take 100 mg by mouth daily as needed (for mood).   Tonia Ghent, MD Taking Active   SIMBRINZA 1-0.2 % SUSP [id number] No Place 1 drop into both eyes 3 (three) times daily. [provider] Taking Active Self           Med Note Duffy Bruce, Deretha Emory Nov 01, 2020  2:34 PM) The patient stated he is using only 1 prescribed eye drop at this time  zolpidem (AMBIEN CR) 12.5 MG CR tablet [id number]  Take 1 tablet (12.5 mg total) by mouth at bedtime. Mozingo, Berdie Ogren, NP  Active             Patient Active Problem List   Diagnosis Date Noted   Rash 10/04/2020   Glaucoma suspect of both eyes 10/03/2020   Cataract, nuclear sclerotic, left eye 08/08/2020   Pain of finger 08/06/2020   Abdominal wall pain 08/06/2020   Exudative age-related macular degeneration of left eye with active choroidal neovascularization ([place]) 03/14/2020   Advanced nonexudative age-related macular degeneration of right eye without subfoveal involvement 03/14/2020   Advanced nonexudative age-related macular degeneration of left eye with subfoveal involvement 03/14/2020   Healthcare maintenance 01/24/2020   Advance care planning 01/24/2020   Hypothyroidism 01/24/2020   Community acquired pneumonia 01/24/2020    Vasovagal syncope 05/16/2019   Other social stressor 02/27/2019   FH: CAD (coronary  artery disease) 02/27/2019   Polycythemia 01/22/2019   Thunderclap headache 12/25/2018   Ocular migraine 12/25/2018   Rhinorrhea 12/25/2018   Diarrhea 12/25/2018   Postphlebitic syndrome 03/15/2018   Long term (current) use of anticoagulants 03/15/2018   Varicose veins 03/29/2015   Back pain 11/15/2014   CAD - minor - 30-40% LAD, 30% RCA 10/12 06/24/2013   Essential tremor 01/17/2012   Pure hypercholesterolemia 06/29/2011   History of bladder cancer 12/24/2010   TRANSIENT ISCHEMIC ATTACKS, HX OF 05/01/2010   HEMATURIA, HX OF 05/01/2010   Thrombocytopenia ([place]) 01/08/2010   Inguinal hernia without obstruction or gangrene 02/01/2009   HYPERGLYCEMIA 08/30/2008   SHOULDER PAIN, LEFT 02/08/2008   History of DVT (deep vein thrombosis) 08/08/2007   Acute thromboembolism of deep veins of lower extremity ([place]) 08/08/2007   Anxiety state 12/30/2006   DEPRESSION 12/30/2006    Immunization History  Administered Date(s) Administered   Influenza Split 08/28/2012   Influenza Whole 05/26/2008   Influenza, High Dose Seasonal PF 07/29/2018, 05/12/2019   Influenza, Seasonal, Injecte, Preservative Fre 06/20/2016   Influenza-Unspecified 07/17/2015, 06/18/2017   Janssen (J&J) SARS-COV-2 Vaccination 11/29/2019   PFIZER(Purple Top)SARS-COV-2 Vaccination 08/10/2020   Pneumococcal Polysaccharide-23 04/26/2005, 05/12/2019   Td 08/29/2008    Conditions to be addressed/monitored:  Hypertension, Hyperlipidemia, Coronary Artery Disease, Hypothyroidism, Depression, and Anxiety  There are no care plans that you recently modified to display for this patient.    Medication Assistance: {MEDASSISTANCEINFO:25044}  Compliance/Adherence/Medication fill history: Care Gaps: Hep C screening  Star-Rating Drugs: None  Patient's preferred pharmacy is:  [facility] [phone] - [place], [address] [address] Phone: [phone] Fax: [phone]  Uses pill box? {Yes or If no, why not?:20788} Pt endorses ***% compliance  We discussed: {Pharmacy options:24294} Patient decided to: {US Pharmacy Plan:23885}  Care Plan and Follow Up Patient Decision:  {FOLLOWUP:24991}  Plan: {CM FOLLOW UP HRCB:63845}  ***    Current Barriers:  {pharmacybarriers:24917}  Pharmacist Clinical Goal(s):  Patient will {PHARMACYGOALCHOICES:24921} through collaboration with PharmD and provider.   Interventions: 1:1 collaboration with Tonia Ghent, MD regarding development and update of comprehensive plan of care as evidenced by provider attestation and co-signature Inter-disciplinary care team collaboration (see longitudinal plan of care) Comprehensive medication review performed; medication list updated in electronic medical record  Hyperlipidemia: (LDL goal < 70) -{US controlled/uncontrolled:25276} -Hx mild CAD per angiography (2012); hx TIA -Current treatment: None -Medications previously tried: rosuvastatin 10 mg, "multiple statins"  -Current dietary patterns: *** -Current exercise habits: *** -Educated on {CCM HLD Counseling:25126} -{CCMPHARMDINTERVENTION:25122}  Depression/Anxiety/Insomnia (Goal: manage symptoms) -{US controlled/uncontrolled:25276} -Current treatment: Sertraline 100 mg daily Clorazepate 7.5 mg daily Zolpidem CR 12.5 mg daily HS -Medications previously tried/failed: *** -PHQ9: 0 (07/2020) -GAD7: 0 (07/2020) -Connected with [facility] (Regina Mozingo) for mental health support -Educated on {CCM mental health counseling:25127} -{CCMPHARMDINTERVENTION:25122}  Recurrent DVT (Goal: prevent VTE) -{US controlled/uncontrolled:25276} -Current treatment  Xarelto 20 mg daily -Medications previously tried: ***  -{CCMPHARMDINTERVENTION:25122}  Hypothyroidism (Goal: maintain TSH in goal  range) -{US controlled/uncontrolled:25276} -Current treatment  Levothyroxine 25 mcg daily -Medications previously tried: ***  -{CCMPHARMDINTERVENTION:25122}  GERD (Goal: manage symptoms) -{US controlled/uncontrolled:25276} -Current treatment  Pantoprazole 40 mg daily -Medications previously tried: ***  -{CCMPHARMDINTERVENTION:25122}  Macular degeneration (Goal: ***) -{US controlled/uncontrolled:25276} -Current treatment  Prolensa eye drops Simbrinza eye drops -Medications previously tried: ***  -{CCMPHARMDINTERVENTION:25122}  Health Maintenance -Vaccine gaps: Flu, Covid booster, Prevnar, Shingrix -Current therapy:  Ibuprofen 200 mg  Multivitamin -Educated on {ccm supplement counseling:25128} -{CCM Patient satisfied:25129} -{CCMPHARMDINTERVENTION:25122}  Patient Goals/Self-Care Activities Patient will:  - {pharmacypatientgoals:24919}

## 2021-07-16 NOTE — Telephone Encounter (Signed)
  Chronic Care Management   Outreach Note  07/16/2021 Name: [name] MRN: [id number] DOB: [birth year]  Referred by: Tonia Ghent, MD  Patient had a phone appointment scheduled with clinical pharmacist 07/13/21.  An unsuccessful telephone outreach was attempted today. The patient was referred to the pharmacist for assistance with medications, care management and care coordination.   Patient will NOT be penalized in any way for missing a CCM appointment. The no-show fee does not apply.  If possible, a message was left to return call to: [phone] or to [facility]   Charlene Brooke, PharmD, BCACP Clinical Pharmacist [facility] [phone]

## 2021-08-02 ENCOUNTER — Ambulatory Visit
Admission: RE | Admit: 2021-08-02 | Discharge: 2021-08-02 | Disposition: A | Discharge status: Home / Self Care | Payer: Medicare Other | Source: Ambulatory Visit | Attending: Family Medicine | Admitting: Family Medicine

## 2021-08-02 ENCOUNTER — Other Ambulatory Visit: Payer: Self-pay | Admitting: Family Medicine

## 2021-08-02 ENCOUNTER — Encounter: Payer: Self-pay | Admitting: Family Medicine

## 2021-08-02 ENCOUNTER — Other Ambulatory Visit: Payer: Medicare Other

## 2021-08-02 ENCOUNTER — Other Ambulatory Visit: Payer: Self-pay

## 2021-08-02 ENCOUNTER — Ambulatory Visit
Admission: RE | Admit: 2021-08-02 | Discharge: 2021-08-02 | Disposition: A | Discharge status: Home / Self Care | Payer: Medicare Other | Attending: Family Medicine | Admitting: Family Medicine

## 2021-08-02 ENCOUNTER — Ambulatory Visit (INDEPENDENT_AMBULATORY_CARE_PROVIDER_SITE_OTHER): Payer: Medicare Other | Admitting: Family Medicine

## 2021-08-02 DIAGNOSIS — J069 Acute upper respiratory infection, unspecified: Secondary | ICD-10-CM | POA: Diagnosis not present

## 2021-08-02 DIAGNOSIS — R059 Cough, unspecified: Secondary | ICD-10-CM | POA: Diagnosis not present

## 2021-08-02 NOTE — Progress Notes (Signed)
Virtual Visit via Telephone Note  I connected with [name] on 08/02/21 at 11:30 AM EST by telephone and verified that I am speaking with the correct person using two identifiers.  Location: Patient: home Provider: office   I discussed the limitations, risks, security and privacy concerns of performing an evaluation and management service by telephone and the availability of in person appointments. I also discussed with the patient that there may be a patient responsible charge related to this service. The patient expressed understanding and agreed to proceed.  Parties involved in encounter  Patient: [name]  Provider:  Loura Pardon MD   History of Present Illness: 70 yo pt of [name] presents with uri symptoms   Symptoms started about a month ago  Cough and runny nose and run down  Productive cough - clear mucous  Last night - cough became more harsh and dry  Not wheezing  No chest tightness Base of his throat feels raw   Nasal- runny  Not too congested  Ears -ring , a little stopped up  No ST  No sinus pressure or pain  Occ headache/not now   No fever /does not suspect  Last night he was a little achey -but that got better (after a lot of sleep)   Had some GI symptoms last night -now he is better   House mate had the same symptoms  He feels better   Quite a bit better today  Does not tolerate tylenol-makes his heart race  Rarely uses advil (carefully with the blood thinner if any)   Had covid vaccine 2020 Had pna in 2020   Patient Active Problem List   Diagnosis Date Noted   URI with cough and congestion 08/02/2021   Rash 10/04/2020   Glaucoma suspect of both eyes 10/03/2020   Cataract, nuclear sclerotic, left eye 08/08/2020   Pain of finger 08/06/2020   Abdominal wall pain 08/06/2020   Exudative age-related macular degeneration of left eye with active choroidal neovascularization ([place]) 03/14/2020   Advanced nonexudative age-related macular  degeneration of right eye without subfoveal involvement 03/14/2020   Advanced nonexudative age-related macular degeneration of left eye with subfoveal involvement 03/14/2020   Healthcare maintenance 01/24/2020   Advance care planning 01/24/2020   Hypothyroidism 01/24/2020   Community acquired pneumonia 01/24/2020   Vasovagal syncope 05/16/2019   Other social stressor 02/27/2019   FH: CAD (coronary artery disease) 02/27/2019   Polycythemia 01/22/2019   Thunderclap headache 12/25/2018   Ocular migraine 12/25/2018   Rhinorrhea 12/25/2018   Diarrhea 12/25/2018   Postphlebitic syndrome 03/15/2018   Long term (current) use of anticoagulants 03/15/2018   Varicose veins 03/29/2015   Back pain 11/15/2014   CAD - minor - 30-40% LAD, 30% RCA 10/12 06/24/2013   Essential tremor 01/17/2012   Pure hypercholesterolemia 06/29/2011   History of bladder cancer 12/24/2010   TRANSIENT ISCHEMIC ATTACKS, HX OF 05/01/2010   HEMATURIA, HX OF 05/01/2010   Thrombocytopenia ([place]) 01/08/2010   Inguinal hernia without obstruction or gangrene 02/01/2009   HYPERGLYCEMIA 08/30/2008   SHOULDER PAIN, LEFT 02/08/2008   History of DVT (deep vein thrombosis) 08/08/2007   Acute thromboembolism of deep veins of lower extremity ([place]) 08/08/2007   Anxiety state 12/30/2006   DEPRESSION 12/30/2006   Past Medical History:  Diagnosis Date   Alcohol abuse, unspecified    history of   Anxiety    Arthritis    all over   Bladder cancer ([place]) 2011   Philip. Janice Norrie  CAD (coronary artery disease) 2012   MI   Depression    Dyslipidemia    GERD (gastroesophageal reflux disease)    History of DVT (deep vein thrombosis) 11/08 and again in 2014   previously on coumadin   Hypertension    Hypothyroidism    Inguinal hernia without mention of obstruction or gangrene, unilateral or unspecified, (not specified as recurrent)    Internal hemorrhoids without mention of complication    Myocardial infarction [facility])    2011    Other  abnormal glucose    Personal history of other disorder of urinary system    Pneumonia    hx of 12/2019    Pulmonary embolism ([place])    hx of    Stroke ([place]) 05/25/2005   TIA   Thrombocytopenia, unspecified ([place])    TIA (transient ischemic attack) 2008   Past Surgical History:  Procedure Laterality Date   CARDIAC CATHETERIZATION  05/2011   mild atherosclerosis   CYSTOSCOPY  07/12/2011   Procedure: CYSTOSCOPY;  Surgeon: Hanley Ben, MD;  Location: [facility];  Service: Urology;  Laterality: N/A;  one hour being requested for this case   EYE SURGERY Left    injection from wet AMD (age related macular degeneration)   HERNIA REPAIR  8/11   repair of right indirect and direct hernias; incarerated umbilical hernia containing preperitoneal fat   INGUINAL HERNIA REPAIR Left 11/14/2020   Procedure: OPEN LEFT INGUINAL HERNIA REPAIR WITH MESH;  Surgeon: Johnathan Hausen, MD;  Location: [facility];  Service: General;  Laterality: Left;  90 MIN   SHOULDER SURGERY  03/08/08   Left; arthroscopy   TONSILLECTOMY     as child   TRANSURETHRAL RESECTION OF BLADDER TUMOR  07/12/2011   Procedure: TRANSURETHRAL RESECTION OF BLADDER TUMOR (TURBT);  Surgeon: Hanley Ben, MD;  Location: [facility];  Service: Urology;  Laterality: N/A;   Social History   Tobacco Use   Smoking status: Former    Packs/day: 1.50    Years: 25.00    Pack years: 37.50    Types: Cigarettes    Quit date: 08/26/2004    Years since quitting: 16.9   Smokeless tobacco: Never  Vaping Use   Vaping Use: Never used  Substance Use Topics   Alcohol use: No    Alcohol/week: 0.0 standard drinks    Comment: Quit 2009   Drug use: Not Currently    Frequency: 2.0 times per week    Types: Marijuana    Comment: MJ-regular use  10/2013 no longer uses   Family History  Problem Relation Age of Onset   Other Father        Hypotension from medication causing GI bleed   Heart failure Father    Emphysema Mother    Prostate cancer Neg Hx     Allergies  Allergen Reactions   Crestor [Rosuvastatin]     Mood changes   Escitalopram Oxalate     Mood changes, irritablity   Latex Other (See Comments)    Blood in urine with latex catheter- but this was likely from mechanical irritation, not from the latex itself   Sulfonamide Derivatives Rash   Current Outpatient Medications on File Prior to Visit  Medication Sig Dispense Refill   clorazepate (TRANXENE) 7.5 MG tablet TAKE 1 TABLET(7.5 MG) BY MOUTH DAILY 30 tablet 2   HYDROcodone-acetaminophen (NORCO/VICODIN) 5-325 MG tablet Take 1 tablet by mouth every 6 (six) hours as needed for moderate pain. 15 tablet 0   ibuprofen (ADVIL,MOTRIN) 200  MG tablet Take 100-200 mg by mouth daily as needed for mild pain.     levothyroxine (SYNTHROID) 25 MCG tablet Take 1 tablet (25 mcg total) by mouth daily before breakfast. 90 tablet 1   Multiple Vitamin (MULTIVITAMIN WITH MINERALS) TABS tablet Take 1 tablet by mouth daily.     pantoprazole (PROTONIX) 40 MG tablet TAKE 1 TABLET(40 MG) BY MOUTH EVERY MORNING 30 MINUTES BEFORE BREAKFAST 30 tablet 11   PROLENSA 0.07 % SOLN Place 1 drop into the right eye See admin instructions.     sertraline (ZOLOFT) 100 MG tablet Take 1 tablet (100 mg total) by mouth daily. (Patient taking differently: Take 100 mg by mouth daily as needed (for mood).)     SIMBRINZA 1-0.2 % SUSP Place 1 drop into both eyes 3 (three) times daily.     XARELTO 20 MG TABS tablet TAKE 1 TABLET BY MOUTH EVERY DAY WITH SUPPER. NEEDS APPOINTMENT, CALL OFFICE. 15 tablet 0   zolpidem (AMBIEN CR) 12.5 MG CR tablet Take 1 tablet (12.5 mg total) by mouth at bedtime. 30 tablet 2   Current Facility-Administered Medications on File Prior to Visit  Medication Dose Route Frequency Provider Last Rate Last Admin   ciprofloxacin (CIPRO) tablet 500 mg  500 mg Oral Once McKenzie, Candee Furbish, MD       Review of Systems  Constitutional:  Positive for malaise/fatigue. Negative for chills and fever.  HENT:   Positive for congestion. Negative for ear pain, sinus pain and sore throat.   Eyes:  Negative for blurred vision, discharge and redness.  Respiratory:  Positive for cough and sputum production. Negative for shortness of breath, wheezing and stridor.   Cardiovascular:  Negative for chest pain, palpitations and leg swelling.  Gastrointestinal:  Negative for abdominal pain, diarrhea, nausea and vomiting.  Musculoskeletal:  Negative for myalgias.  Skin:  Negative for rash.  Neurological:  Positive for headaches. Negative for dizziness.   Observations/Objective: Pt sounds well  Not in distress Not hoarse No audible sob or wheeze with conversation  No cough heard Nl cognition/good historian Nl mood   Assessment and Plan: Problem List Items Addressed This Visit       Respiratory   URI with cough and congestion    Cough for a month but more acute symptoms last night (improved currently) Set up resp swab for covid and flu and rsv at the [place] office Also cxr at [place] (he has h/o pneumonia) Fluids /rest and symptom care  Isolate until results and feeling better  ER precautions discussed Update if not starting to improve in a week or if worsening          Follow Up Instructions: Drink fluids and rest   Drink fluids and rest  mucinex DM is good for cough and congestion  (delsym instead for dry cough) Nasal saline for congestion as needed  Tylenol for fever or pain or headache  Please alert [place] if symptoms worsen (if severe or short of breath please go to the ER)   The office will call you to set up a swab for flu/covid and rsv  Also a chest xray at [place] to yourself until we get results    I discussed the assessment and treatment plan with the patient. The patient was provided an opportunity to ask questions and all were answered. The patient agreed with the plan and demonstrated an understanding of the instructions.   The patient was advised to call back or seek  an  in-person evaluation if the symptoms worsen or if the condition fails to improve as anticipated.  I provided 18 minutes of non-face-to-face time during this encounter.   Loura Pardon, MD

## 2021-08-02 NOTE — Assessment & Plan Note (Signed)
Cough for a month but more acute symptoms last night (improved currently) Set up resp swab for covid and flu and rsv at the [place] office Also cxr at [place] (he has h/o pneumonia) Fluids /rest and symptom care  Isolate until results and feeling better  ER precautions discussed Update if not starting to improve in a week or if worsening

## 2021-08-02 NOTE — Patient Instructions (Signed)
Drink fluids and rest   Drink fluids and rest  mucinex DM is good for cough and congestion  (delsym instead for dry cough) Nasal saline for congestion as needed  Tylenol for fever or pain or headache  Please alert [place] if symptoms worsen (if severe or short of breath please go to the ER)   The office will call you to set up a swab for flu/covid and rsv  Also a chest xray at [place] to yourself until we get results

## 2021-08-03 LAB — COVID-19, FLU A+B AND RSV
Influenza A, NAA: NOT DETECTED
Influenza B, NAA: NOT DETECTED
RSV, NAA: NOT DETECTED
SARS-CoV-2, NAA: NOT DETECTED

## 2021-09-05 ENCOUNTER — Telehealth: Payer: Self-pay | Admitting: Adult Health

## 2021-09-05 NOTE — Telephone Encounter (Signed)
Pt called requesting Rx for Zolpidem @ [facility]. Apt 1/17

## 2021-09-05 NOTE — Telephone Encounter (Signed)
Patient last filled on 12/19. Called patient and let him know it was too early to refill.

## 2021-09-11 ENCOUNTER — Ambulatory Visit (INDEPENDENT_AMBULATORY_CARE_PROVIDER_SITE_OTHER): Payer: Medicare Other | Admitting: Adult Health

## 2021-09-11 ENCOUNTER — Other Ambulatory Visit: Payer: Self-pay

## 2021-09-11 ENCOUNTER — Encounter: Payer: Self-pay | Admitting: Adult Health

## 2021-09-11 DIAGNOSIS — F411 Generalized anxiety disorder: Secondary | ICD-10-CM | POA: Diagnosis not present

## 2021-09-11 DIAGNOSIS — F331 Major depressive disorder, recurrent, moderate: Secondary | ICD-10-CM | POA: Diagnosis not present

## 2021-09-11 DIAGNOSIS — G4700 Insomnia, unspecified: Secondary | ICD-10-CM

## 2021-09-11 MED ORDER — SERTRALINE HCL 100 MG PO TABS: 100.0000 mg | ORAL_TABLET | Freq: Every day | ORAL | 5 refills | Status: DC

## 2021-09-11 MED ORDER — ZOLPIDEM TARTRATE ER 12.5 MG PO TBCR: 12.5000 mg | EXTENDED_RELEASE_TABLET | Freq: Every day | ORAL | 2 refills | Status: DC

## 2021-09-11 MED ORDER — CLORAZEPATE DIPOTASSIUM 7.5 MG PO TABS: ORAL_TABLET | ORAL | 2 refills | Status: DC

## 2021-09-11 NOTE — Progress Notes (Signed)
[name] [id number] [birth year] 71 y.o.  Subjective:   Patient ID:  [name] is a 71 y.o. (DOB Aug 18, 1951) male.  Chief Complaint: No chief complaint on file.   HPI [name] presents to the office today for follow-up of insomnia, anxiety, and depression.   Describes mood today as "ok". Pleasant. Denies tearfulness. Mood symptoms - reports depression - more than usual. Restarted Zoloft 100mg  daily and feels like it has been helpful. Reports anxiety about not working. Denies  irritability. Stating "I'm doing ok". Some concerns about money - receiving SSI benefits. Getting eye injections - macular degeneration. 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 Feels like current medications are working for him. Stable interest and motivation. Taking medications as prescribed.  Energy levels stable. Active, has a regular exercise routine. Enjoys some usual interests and activities. Single. Lives with a roommate - 2 cats and a dog. Mostly staying home.  Appetite adequate. Has regained "some" sense of taste and smell. Weight fluctuates 185 from 210 pounds. Sleeps better some nights than others. Averages 8 hours. Focus and concentration stable. Completing tasks. Managing aspects of household. Works as a Environmental manager. Denies SI or HI.  Denies AH or VH.  Previous medication trials: Zoloft, Ambien, Tranxene   GAD-7    Flowsheet Row Office Visit from 08/04/2020 in [place] at [facility]  Total GAD-7 Score [place] Office Visit from 08/04/2020 in [place] at [place] from 12/24/2019 in [place] at [place]  PHQ-2 Total Score 0 0  PHQ-9 Total Score -- 0      Flowsheet Row ED from 11/01/2020 in [place] Error: Question 6 not populated        Review of Systems:  Review of Systems  Musculoskeletal:  Negative for gait  problem.  Neurological:  Negative for tremors.  Psychiatric/Behavioral:         Please refer to HPI   Medications: I have reviewed the patient's current medications.  Current Outpatient Medications  Medication Sig Dispense Refill   clorazepate (TRANXENE) 7.5 MG tablet TAKE 1 TABLET(7.5 MG) BY MOUTH DAILY 30 tablet 2   HYDROcodone-acetaminophen (NORCO/VICODIN) 5-325 MG tablet Take 1 tablet by mouth every 6 (six) hours as needed for moderate pain. 15 tablet 0   ibuprofen (ADVIL,MOTRIN) 200 MG tablet Take 100-200 mg by mouth daily as needed for mild pain.     levothyroxine (SYNTHROID) 25 MCG tablet Take 1 tablet (25 mcg total) by mouth daily before breakfast. 90 tablet 1   Multiple Vitamin (MULTIVITAMIN WITH MINERALS) TABS tablet Take 1 tablet by mouth daily.     pantoprazole (PROTONIX) 40 MG tablet TAKE 1 TABLET(40 MG) BY MOUTH EVERY MORNING 30 MINUTES BEFORE BREAKFAST 30 tablet 11   PROLENSA 0.07 % SOLN Place 1 drop into the right eye See admin instructions.     sertraline (ZOLOFT) 100 MG tablet Take 1 tablet (100 mg total) by mouth daily. (Patient taking differently: Take 100 mg by mouth daily as needed (for mood).)     SIMBRINZA 1-0.2 % SUSP Place 1 drop into both eyes 3 (three) times daily.     XARELTO 20 MG TABS tablet TAKE 1 TABLET BY MOUTH EVERY DAY WITH SUPPER. NEEDS APPOINTMENT, CALL OFFICE. 15 tablet 0   zolpidem (AMBIEN CR) 12.5 MG CR tablet Take 1 tablet (12.5 mg total) by  mouth at bedtime. 30 tablet 2   Current Facility-Administered Medications  Medication Dose Route Frequency Provider Last Rate Last Admin   ciprofloxacin (CIPRO) tablet 500 mg  500 mg Oral Once Cleon Gustin, MD        Medication Side Effects: None  Allergies:  Allergies  Allergen Reactions   Crestor [Rosuvastatin]     Mood changes   Escitalopram Oxalate     Mood changes, irritablity   Latex Other (See Comments)    Blood in urine with latex catheter- but this was likely from mechanical irritation,  not from the latex itself   Sulfonamide Derivatives Rash    Past Medical History:  Diagnosis Date   Alcohol abuse, unspecified    history of   Anxiety    Arthritis    all over   Bladder cancer [facility]) 2011   Dr. Janice Norrie   CAD (coronary artery disease) 2012   MI   Depression    Dyslipidemia    GERD (gastroesophageal reflux disease)    History of DVT (deep vein thrombosis) 11/08 and again in 2014   previously on coumadin   Hypertension    Hypothyroidism    Inguinal hernia without mention of obstruction or gangrene, unilateral or unspecified, (not specified as recurrent)    Internal hemorrhoids without mention of complication    Myocardial infarction [facility])    2011    Other abnormal glucose    Personal history of other disorder of urinary system    Pneumonia    hx of 12/2019    Pulmonary embolism (HCC)    hx of    Stroke ([place]) 05/25/2005   TIA   Thrombocytopenia, unspecified ([place])    TIA (transient ischemic attack) 2008    Past Medical History, Surgical history, Social history, and Family history were reviewed and updated as appropriate.   Please see review of systems for further details on the patient's review from today.   Objective:   Physical Exam:  There were no vitals taken for this visit.  Physical Exam Constitutional:      General: He is not in acute distress. Musculoskeletal:        General: No deformity.  Neurological:     Mental Status: He is alert and oriented to person, place, and time.     Coordination: Coordination normal.  Psychiatric:        Attention and Perception: Attention and perception normal. He does not perceive auditory or visual hallucinations.        Mood and Affect: Mood normal. Mood is not anxious or depressed. Affect is not labile, blunt, angry or inappropriate.        Speech: Speech normal.        Behavior: Behavior normal.        Thought Content: Thought content normal. Thought content is not paranoid or delusional. Thought content  does not include homicidal or suicidal ideation. Thought content does not include homicidal or suicidal plan.        Cognition and Memory: Cognition and memory normal.        Judgment: Judgment normal.     Comments: Insight intact    Lab Review:     Component Value Date/Time   NA 136 11/01/2020 1043   NA 137 02/18/2019 1549   K 4.1 11/01/2020 1043   CL 103 11/01/2020 1043   CO2 23 11/01/2020 1043   GLUCOSE 113 (H) 11/01/2020 1043   BUN 21 11/01/2020 1043   BUN 21 02/18/2019 1549  CREATININE 0.92 11/01/2020 1043   CREATININE 0.88 01/02/2017 0953   CALCIUM 9.3 11/01/2020 1043   PROT 7.3 11/01/2020 1043   ALBUMIN 3.9 11/01/2020 1043   AST 34 11/01/2020 1043   ALT 42 11/01/2020 1043   ALKPHOS 33 (L) 11/01/2020 1043   BILITOT 1.4 (H) 11/01/2020 1043   GFRNONAA >60 11/01/2020 1043   GFRAA >60 02/25/2020 1254       Component Value Date/Time   WBC 9.4 11/01/2020 1043   RBC 5.50 11/01/2020 1043   HGB 18.2 (H) 11/01/2020 1043   HGB 17.4 02/18/2019 1549   HCT 52.8 (H) 11/01/2020 1043   HCT 48.6 02/18/2019 1549   PLT 184 11/01/2020 1043   PLT 170 02/18/2019 1549   MCV 96.0 11/01/2020 1043   MCV 90 02/18/2019 1549   MCH 33.1 11/01/2020 1043   MCHC 34.5 11/01/2020 1043   RDW 13.2 11/01/2020 1043   RDW 12.5 02/18/2019 1549   LYMPHSABS 1.3 01/03/2020 0910   MONOABS 0.5 01/03/2020 0910   EOSABS 0.1 01/03/2020 0910   BASOSABS 0.0 01/03/2020 0910    No results found for: POCLITH, LITHIUM   No results found for: PHENYTOIN, PHENOBARB, VALPROATE, CBMZ   .res Assessment: Plan:     Plan:  PDMP reviewed  1. Zoloft 100mg  daily  2. Ambien CR 12.5mg  daily 3. Chlorazepate 7.5mg  daily  RTC 6 months  Patient advised to contact office with any questions, adverse effects, or acute worsening in signs and symptoms.  Discussed potential benefits, risk, and side effects of benzodiazepines to include potential risk of tolerance and dependence, as well as possible drowsiness.   Advised patient not to drive if experiencing drowsiness and to take lowest possible effective dose to minimize risk of dependence and tolerance.  Diagnoses and all orders for this visit:  Major depressive disorder, recurrent episode, moderate (HCC)  Generalized anxiety disorder  Insomnia, unspecified type     Please see After Visit Summary for patient specific instructions.  No future appointments.  No orders of the defined types were placed in this encounter.   -------------------------------

## 2021-09-17 ENCOUNTER — Other Ambulatory Visit: Payer: Self-pay | Admitting: Cardiovascular Disease

## 2021-09-17 NOTE — Telephone Encounter (Signed)
Prescription refill request for Xarelto received.  Indication:DVT Last office visit:Needs Appointment Weight:90.5 kg Age:71 Scr:0.9 CrCl:97.96 ml/min  Prescription refilled

## 2021-10-01 ENCOUNTER — Other Ambulatory Visit: Payer: Self-pay

## 2021-10-01 ENCOUNTER — Encounter (INDEPENDENT_AMBULATORY_CARE_PROVIDER_SITE_OTHER): Payer: Self-pay | Admitting: Ophthalmology

## 2021-10-01 ENCOUNTER — Ambulatory Visit (INDEPENDENT_AMBULATORY_CARE_PROVIDER_SITE_OTHER): Payer: Medicare Other | Admitting: Ophthalmology

## 2021-10-01 DIAGNOSIS — H353124 Nonexudative age-related macular degeneration, left eye, advanced atrophic with subfoveal involvement: Secondary | ICD-10-CM

## 2021-10-01 DIAGNOSIS — H353113 Nonexudative age-related macular degeneration, right eye, advanced atrophic without subfoveal involvement: Secondary | ICD-10-CM

## 2021-10-01 DIAGNOSIS — H353222 Exudative age-related macular degeneration, left eye, with inactive choroidal neovascularization: Secondary | ICD-10-CM | POA: Diagnosis not present

## 2021-10-01 DIAGNOSIS — H353221 Exudative age-related macular degeneration, left eye, with active choroidal neovascularization: Secondary | ICD-10-CM

## 2021-10-01 NOTE — Assessment & Plan Note (Signed)
No sign of CN VM today, signs or symptoms of recurrence is discussed

## 2021-10-01 NOTE — Assessment & Plan Note (Signed)
Accounts for acuity 

## 2021-10-01 NOTE — Progress Notes (Signed)
10/01/2021     CHIEF COMPLAINT Patient presents for  Chief Complaint  Patient presents with   Retina Follow Up      HISTORY OF PRESENT ILLNESS: [name] is a 71 y.o. male who presents to the clinic today for:   HPI     Retina Follow Up           Diagnosis: Wet AMD   Laterality: both eyes   Onset: 6 months ago   Severity: mild   Duration: 6 months   Course: stable         Comments   6 month fu OU   Pt states VA OU stable since last visit. Pt denies FOL, floaters, or ocular pain OU.  Pt states, "the only real issues that I have is that I take Xarelto and if I bend over my eyes feel swollen and are red but I know that it come from the blood thinner."  Pt reports, "I am using Simbrinza in my left eye but I only use it like two times a week."        Last edited by Kendra Opitz, COA on 10/01/2021  9:41 AM.      Referring physician: Warden Fillers, MD [address] [place],  [place] [address]  HISTORICAL INFORMATION:   Selected notes from the MEDICAL RECORD NUMBER       CURRENT MEDICATIONS: Current Outpatient Medications (Ophthalmic Drugs)  Medication Sig   PROLENSA 0.07 % SOLN Place 1 drop into the right eye See admin instructions.   SIMBRINZA 1-0.2 % SUSP Place 1 drop into both eyes 3 (three) times daily.   No current facility-administered medications for this visit. (Ophthalmic Drugs)   Current Outpatient Medications (Other)  Medication Sig   clorazepate (TRANXENE) 7.5 MG tablet TAKE 1 TABLET(7.5 MG) BY MOUTH DAILY   HYDROcodone-acetaminophen (NORCO/VICODIN) 5-325 MG tablet Take 1 tablet by mouth every 6 (six) hours as needed for moderate pain.   ibuprofen (ADVIL,MOTRIN) 200 MG tablet Take 100-200 mg by mouth daily as needed for mild pain.   levothyroxine (SYNTHROID) 25 MCG tablet Take 1 tablet (25 mcg total) by mouth daily before breakfast.   Multiple Vitamin (MULTIVITAMIN WITH MINERALS) TABS tablet Take 1 tablet by mouth daily.    pantoprazole (PROTONIX) 40 MG tablet TAKE 1 TABLET(40 MG) BY MOUTH EVERY MORNING 30 MINUTES BEFORE BREAKFAST   rivaroxaban (XARELTO) 20 MG TABS tablet Take 1 tablet (20 mg total) by mouth daily with supper.   sertraline (ZOLOFT) 100 MG tablet Take 1 tablet (100 mg total) by mouth daily.   zolpidem (AMBIEN CR) 12.5 MG CR tablet Take 1 tablet (12.5 mg total) by mouth at bedtime.   Current Facility-Administered Medications (Other)  Medication Route   ciprofloxacin (CIPRO) tablet 500 mg Oral      REVIEW OF SYSTEMS: ROS   Negative for: Constitutional, Gastrointestinal, Neurological, Skin, Genitourinary, Musculoskeletal, HENT, Endocrine, Cardiovascular, Eyes, Respiratory, Psychiatric, Allergic/Imm, Heme/Lymph Last edited by Hurman Horn, MD on 10/01/2021 10:09 AM.       ALLERGIES Allergies  Allergen Reactions   Crestor [Rosuvastatin]     Mood changes   Escitalopram Oxalate     Mood changes, irritablity   Latex Other (See Comments)    Blood in urine with latex catheter- but this was likely from mechanical irritation, not from the latex itself   Sulfonamide Derivatives Rash    PAST MEDICAL HISTORY Past Medical History:  Diagnosis Date   Alcohol abuse,  unspecified    history of   Anxiety    Arthritis    all over   Bladder cancer [facility]) 2011   Dr. Janice Norrie   CAD (coronary artery disease) 2012   MI   Depression    Dyslipidemia    GERD (gastroesophageal reflux disease)    History of DVT (deep vein thrombosis) 11/08 and again in 2014   previously on coumadin   Hypertension    Hypothyroidism    Inguinal hernia without mention of obstruction or gangrene, unilateral or unspecified, (not specified as recurrent)    Internal hemorrhoids without mention of complication    Myocardial infarction [facility])    2011    Other abnormal glucose    Personal history of other disorder of urinary system    Pneumonia    hx of 12/2019    Pulmonary embolism (HCC)    hx of    Stroke ([place])  05/25/2005   TIA   Thrombocytopenia, unspecified ([place])    TIA (transient ischemic attack) 2008   Past Surgical History:  Procedure Laterality Date   CARDIAC CATHETERIZATION  05/2011   mild atherosclerosis   CYSTOSCOPY  07/12/2011   Procedure: CYSTOSCOPY;  Surgeon: Hanley Ben, MD;  Location: [facility];  Service: Urology;  Laterality: N/A;  one hour being requested for this case   EYE SURGERY Left    injection from wet AMD (age related macular degeneration)   HERNIA REPAIR  8/11   repair of right indirect and direct hernias; incarerated umbilical hernia containing preperitoneal fat   INGUINAL HERNIA REPAIR Left 11/14/2020   Procedure: OPEN LEFT INGUINAL HERNIA REPAIR WITH MESH;  Surgeon: Johnathan Hausen, MD;  Location: [facility];  Service: General;  Laterality: Left;  90 MIN   SHOULDER SURGERY  03/08/08   Left; arthroscopy   TONSILLECTOMY     as child   TRANSURETHRAL RESECTION OF BLADDER TUMOR  07/12/2011   Procedure: TRANSURETHRAL RESECTION OF BLADDER TUMOR (TURBT);  Surgeon: Hanley Ben, MD;  Location: [facility];  Service: Urology;  Laterality: N/A;    FAMILY HISTORY Family History  Problem Relation Age of Onset   Other Father        Hypotension from medication causing GI bleed   Heart failure Father    Emphysema Mother    Prostate cancer Neg Hx     SOCIAL HISTORY Social History   Tobacco Use   Smoking status: Former    Packs/day: 1.50    Years: 25.00    Pack years: 37.50    Types: Cigarettes    Quit date: 08/26/2004    Years since quitting: 17.1   Smokeless tobacco: Never  Vaping Use   Vaping Use: Never used  Substance Use Topics   Alcohol use: No    Alcohol/week: 0.0 standard drinks    Comment: Quit 2009   Drug use: Not Currently    Frequency: 2.0 times per week    Types: Marijuana    Comment: MJ-regular use  10/2013 no longer uses         OPHTHALMIC EXAM:  Base Eye Exam     Visual Acuity (ETDRS)       Right Left   Dist [place] 20/20 -2 20/125 -1   Dist  ph [place]  NI         Tonometry (Tonopen, 9:45 AM)       Right Left   Pressure 26 30         Tonometry #2 (Tonopen, 9:45 AM)  Right Left   Pressure 27 29         Pupils       Pupils Dark Light Shape React APD   Right PERRL 4 4 Round Brisk None   Left PERRL 4 4 Round Brisk None         Visual Fields (Counting fingers)       Left Right    Full Full         Extraocular Movement       Right Left    Full, Ortho Full, Ortho         Neuro/Psych     Oriented x3: Yes   Mood/Affect: Agitated         Dilation     Both eyes: 1.0% Mydriacyl, 2.5% Phenylephrine @ 9:45 AM           Slit Lamp and Fundus Exam     External Exam       Right Left   External Normal Normal         Slit Lamp Exam       Right Left   Lids/Lashes Normal Normal   Conjunctiva/Sclera White and quiet White and quiet   Cornea Clear Clear   Anterior Chamber Deep and quiet, by Rito Ehrlich Deep and quiet, Rito Ehrlich   Iris Round and reactive Round and reactive   Lens Centered posterior chamber intraocular lens 2+ Nuclear sclerosis   Anterior Vitreous Normal Normal         Fundus Exam       Right Left   Posterior Vitreous Posterior vitreous detachment Posterior vitreous detachment   Disc Normal Normal   C/D Ratio 0.65 0.5   Macula attached, para macular geographic atrophy not in the FAZ, some features of old RPE rip, no active disease Geographic atrophy, Macular thickening, Hard drusen, Disciform scar, inactive   Vessels Normal Normal   Periphery  attached, Normal            IMAGING AND PROCEDURES  Imaging and Procedures for 10/01/21  OCT, Retina - OU - Both Eyes       Right Eye Quality was good. Scan locations included subfoveal. Central Foveal Thickness: 290. Progression has been stable. Findings include no IRF, no SRF, subretinal hyper-reflective material, outer retinal atrophy, central retinal atrophy.   Left Eye Quality was good. Scan locations  included subfoveal. Central Foveal Thickness: 285. Progression has been stable. Findings include abnormal foveal contour, central retinal atrophy, outer retinal atrophy, no IRF, no SRF, subretinal scarring, disciform scar.   Notes OD with subretinal hyper reflective material, yet no active intraretinal fluid nor CME.  OS with foveal atrophy secondary to resolution of CNVM with residual scarring from disciform scar and subretinal Hyper reflective material, stable with no recurrences at 43-month follow-up               ASSESSMENT/PLAN:  Advanced nonexudative age-related macular degeneration of right eye without subfoveal involvement No sign of CN VM today, signs or symptoms of recurrence is discussed  Advanced nonexudative age-related macular degeneration of left eye with subfoveal involvement Accounts for acuity  Exudative age-related macular degeneration of left eye with inactive choroidal neovascularization ([place]) No longer active disease     ICD-10-CM   1. Exudative age-related macular degeneration of left eye with active choroidal neovascularization (HCC)  H35.3221 OCT, Retina - OU - Both Eyes    2. Advanced nonexudative age-related macular degeneration of right eye without subfoveal involvement  H35.3113 OCT, Retina -  OU - Both Eyes    3. Advanced nonexudative age-related macular degeneration of left eye with subfoveal involvement  H35.3124     4. Exudative age-related macular degeneration of left eye with inactive choroidal neovascularization ([place])  H35.3222       1.  OU overall stable, no interval change.  Inactive disease in the left eye with disciform scar limiting acuity  2.  OU with advanced ARMD, stable acuity OD  3.  Patient to report promptly any new signs or symptoms of distortion in either eye  Ophthalmic Meds Ordered this visit:  No orders of the defined types were placed in this encounter.      Return in about 6 months (around 03/31/2022) for DILATE OU,  OCT.  There are no Patient Instructions on file for this visit.   Explained the diagnoses, plan, and follow up with the patient and they expressed understanding.  Patient expressed understanding of the importance of proper follow up care.   Clent Demark Apolo Cutshaw M.D. Diseases & Surgery of the Retina and Vitreous [facility] [place] 10/01/21     Abbreviations: M myopia (nearsighted); A astigmatism; H hyperopia (farsighted); P presbyopia; Mrx spectacle prescription;  CTL contact lenses; OD right eye; OS left eye; OU both eyes  XT exotropia; ET esotropia; PEK punctate epithelial keratitis; PEE punctate epithelial erosions; DES dry eye syndrome; MGD meibomian gland dysfunction; ATs artificial tears; PFAT's preservative free artificial tears; [place] nuclear sclerotic cataract; PSC posterior subcapsular cataract; ERM epi-retinal membrane; PVD posterior vitreous detachment; RD retinal detachment; DM diabetes mellitus; DR diabetic retinopathy; NPDR non-proliferative diabetic retinopathy; PDR proliferative diabetic retinopathy; CSME clinically significant macular edema; DME diabetic macular edema; dbh dot blot hemorrhages; CWS cotton wool spot; POAG primary open angle glaucoma; C/D cup-to-disc ratio; HVF humphrey visual field; GVF goldmann visual field; OCT optical coherence tomography; IOP intraocular pressure; BRVO Branch retinal vein occlusion; CRVO central retinal vein occlusion; CRAO central retinal artery occlusion; BRAO branch retinal artery occlusion; RT retinal tear; SB scleral buckle; PPV pars plana vitrectomy; VH Vitreous hemorrhage; PRP panretinal laser photocoagulation; IVK intravitreal kenalog; VMT vitreomacular traction; MH Macular hole;  NVD neovascularization of the disc; NVE neovascularization elsewhere; AREDS age related eye disease study; ARMD age related macular degeneration; POAG primary open angle glaucoma; EBMD epithelial/anterior basement membrane dystrophy; ACIOL anterior chamber  intraocular lens; IOL intraocular lens; PCIOL posterior chamber intraocular lens; Phaco/IOL phacoemulsification with intraocular lens placement; [place] photorefractive keratectomy; LASIK laser assisted in situ keratomileusis; HTN hypertension; DM diabetes mellitus; COPD chronic obstructive pulmonary disease

## 2021-10-01 NOTE — Assessment & Plan Note (Signed)
No longer active disease

## 2021-10-11 ENCOUNTER — Telehealth: Payer: Self-pay | Admitting: Cardiovascular Disease

## 2021-10-11 NOTE — Telephone Encounter (Signed)
Pt c/o swelling: STAT is pt has developed SOB within 24 hours  How much weight have you gained and in what time span?  10-15 lbs in the past 2-3 years  If swelling, where is the swelling located?  Right leg, from the knee down   Are you currently taking a fluid pill?  No   Are you currently SOB?  No   Do you have a log of your daily weights (if so, list)?  No log available  Have you gained 3 pounds in a day or 5 pounds in a week?  No   Have you traveled recently?  No   Pt c/o medication issue:  1. Name of Medication:  rivaroxaban (XARELTO) 20 MG TABS tablet  2. How are you currently taking this medication (dosage and times per day)?  As prescribed, 1 tablet by mouth daily  3. Are you having a reaction (difficulty breathing--STAT)? No   4. What is your medication issue?   Please confirm how patient needs to take medication.

## 2021-10-11 NOTE — Telephone Encounter (Signed)
Spoke with patient who reports having been diagnosed with macular degeneration in both eyes with glaucoma. He was told by ophthalmologist that [place] can worsen eye situation. Patient wants to know if there is another medication he can take instead of xarelto. He also has swelling in the right leg from the knee down. Denies pain, but has difficulty in applying compression stockings. Please advise.

## 2021-10-11 NOTE — Telephone Encounter (Signed)
I am not familiar with how Xarelto can worsen macular degeneration unless there is a bleed, which would then apply to any anticoagulant.

## 2021-10-12 NOTE — Telephone Encounter (Signed)
Spoke with patient again regarding xarelto. He reports that when he bends over to clean the tub, etc., his eyes get "bloodshot" and then dissipates throughout the day. He was asking if the xarelto dose could be changed or go to eliquis.

## 2021-10-12 NOTE — Telephone Encounter (Signed)
I have no problem switching to Eliquis, I do believe it probably comes with a lower bleeding risk then Xarelto.  Just please point out to him that it is a twice a day medication.

## 2021-10-12 NOTE — Telephone Encounter (Signed)
Left a message for the patient to call back.  

## 2021-10-17 NOTE — Telephone Encounter (Signed)
Left a message for the patient to call back.  

## 2021-10-19 DIAGNOSIS — C4442 Squamous cell carcinoma of skin of scalp and neck: Secondary | ICD-10-CM | POA: Diagnosis not present

## 2021-10-19 DIAGNOSIS — L821 Other seborrheic keratosis: Secondary | ICD-10-CM | POA: Diagnosis not present

## 2021-10-19 DIAGNOSIS — L570 Actinic keratosis: Secondary | ICD-10-CM | POA: Diagnosis not present

## 2021-10-19 DIAGNOSIS — D0439 Carcinoma in situ of skin of other parts of face: Secondary | ICD-10-CM | POA: Diagnosis not present

## 2021-10-22 NOTE — Telephone Encounter (Signed)
Left a message foe the patient to call back. He has an appointment on 3/13

## 2021-10-31 NOTE — Progress Notes (Signed)
Cardiology Clinic Note   Patient Name: [name] Date of Encounter: 11/05/2021  Primary Care Provider:  Tonia Ghent, MD Primary Cardiologist:  Sanda Klein, MD  Patient Profile    [name] 71 year old male presents to the clinic today for follow-up evaluation of his palpitations  Past Medical History    Past Medical History:  Diagnosis Date   Alcohol abuse, unspecified    history of   Anxiety    Arthritis    all over   Bladder cancer [facility]) 2011   Dr. Janice Norrie   CAD (coronary artery disease) 2012   MI   Depression    Dyslipidemia    GERD (gastroesophageal reflux disease)    History of DVT (deep vein thrombosis) 11/08 and again in 2014   previously on coumadin   Hypertension    Hypothyroidism    Inguinal hernia without mention of obstruction or gangrene, unilateral or unspecified, (not specified as recurrent)    Internal hemorrhoids without mention of complication    Myocardial infarction [facility])    2011    Other abnormal glucose    Personal history of other disorder of urinary system    Pneumonia    hx of 12/2019    Pulmonary embolism ([place])    hx of    Stroke ([place]) 05/25/2005   TIA   Thrombocytopenia, unspecified ([place])    TIA (transient ischemic attack) 2008   Past Surgical History:  Procedure Laterality Date   CARDIAC CATHETERIZATION  05/2011   mild atherosclerosis   CYSTOSCOPY  07/12/2011   Procedure: CYSTOSCOPY;  Surgeon: Hanley Ben, MD;  Location: [facility];  Service: Urology;  Laterality: N/A;  one hour being requested for this case   EYE SURGERY Left    injection from wet AMD (age related macular degeneration)   HERNIA REPAIR  8/11   repair of right indirect and direct hernias; incarerated umbilical hernia containing preperitoneal fat   INGUINAL HERNIA REPAIR Left 11/14/2020   Procedure: OPEN LEFT INGUINAL HERNIA REPAIR WITH MESH;  Surgeon: Johnathan Hausen, MD;  Location: [facility];  Service: General;  Laterality: Left;  90 MIN    SHOULDER SURGERY  03/08/08   Left; arthroscopy   TONSILLECTOMY     as child   TRANSURETHRAL RESECTION OF BLADDER TUMOR  07/12/2011   Procedure: TRANSURETHRAL RESECTION OF BLADDER TUMOR (TURBT);  Surgeon: Hanley Ben, MD;  Location: [facility];  Service: Urology;  Laterality: N/A;    Allergies  Allergies  Allergen Reactions   Crestor [Rosuvastatin]     Mood changes   Escitalopram Oxalate     Mood changes, irritablity   Latex Other (See Comments)    Blood in urine with latex catheter- but this was likely from mechanical irritation, not from the latex itself   Sulfonamide Derivatives Rash    History of Present Illness    [name] is a PMH of palpitations, chronic DVT, dyslipidemia, and anxiety/depression.  He was noted to have minor coronary artery disease by angiography 2012, Hx of TIA, and chronic peripheral venous insufficiency.  He wore a cardiac event monitor which showed sinus rhythm with normal circadian rhythm, occasional PVCs in bigeminy pattern.  He was started on metoprolol 12.5 mg.  He was seen virtually by Bunnie Domino, DNP 04/26/2019.  During that time he reported he was taking his sertraline daily.  Previously he had not been as consistent with the medication.  He also reported daily compliance with his metoprolol.  He did not  note significant improvement in his palpitations.  However, he did report that they were not bothering him as much.  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  He was noted to be overall improved.  He presents to the clinic today for follow-up evaluation states he continues to have difficulty scheduling appointments for turning piano.  He likely has his property paid off and has a trusted arm.  He is also on Fish farm manager.  He reports that his glaucoma in his left eye is severe.  He reports that his vision has been impacted in bilateral eyes and he routinely receives injections in his left eye.  He denies palpitations.  He reports that he  does not follow a heart healthy diet because he lives alone and prepares his own meals.  He states that he mainly eats dried nuts and cookies.  He continues to struggle with depression due to his vision, other health problems, and not being able to work like he would like.  He is limited in his physical activity due to his eyesight.  We reviewed his lower extremity DVT and need for Xarelto.  He asked for samples.  I will order a direct LDL, give a salty 6 diet sheet, have him increase his physical activity as tolerated, and plan follow-up for 12 months.  Today he denies chest pain, shortness of breath, lower extremity edema, fatigue, palpitations, melena, hematuria, hemoptysis, diaphoresis, weakness, presyncope, syncope, orthopnea, and PND.     Home Medications    Prior to Admission medications   Medication Sig Start Date End Date Taking? Authorizing Provider  clorazepate (TRANXENE) 7.5 MG tablet TAKE 1 TABLET(7.5 MG) BY MOUTH DAILY 09/11/21   Mozingo, Berdie Ogren, NP  HYDROcodone-acetaminophen (NORCO/VICODIN) 5-325 MG tablet Take 1 tablet by mouth every 6 (six) hours as needed for moderate pain. 11/14/20   Johnathan Hausen, MD  ibuprofen (ADVIL,MOTRIN) 200 MG tablet Take 100-200 mg by mouth daily as needed for mild pain.    [provider]  levothyroxine (SYNTHROID) 25 MCG tablet Take 1 tablet (25 mcg total) by mouth daily before breakfast. 02/04/20   Lamptey, Myrene Galas, MD  Multiple Vitamin (MULTIVITAMIN WITH MINERALS) TABS tablet Take 1 tablet by mouth daily.    [provider]  pantoprazole (PROTONIX) 40 MG tablet TAKE 1 TABLET(40 MG) BY MOUTH EVERY MORNING 30 MINUTES BEFORE BREAKFAST 02/08/21   Esterwood, Amy S, PA-C  PROLENSA 0.07 % SOLN Place 1 drop into the right eye See admin instructions. 10/24/20   [provider]  rivaroxaban (XARELTO) 20 MG TABS tablet Take 1 tablet (20 mg total) by mouth daily with supper. 09/17/21   Croitoru, Mihai, MD  sertraline (ZOLOFT) 100  MG tablet Take 1 tablet (100 mg total) by mouth daily. 09/11/21   Mozingo, Berdie Ogren, NP  SIMBRINZA 1-0.2 % SUSP Place 1 drop into both eyes 3 (three) times daily. 10/14/20   [provider]  zolpidem (AMBIEN CR) 12.5 MG CR tablet Take 1 tablet (12.5 mg total) by mouth at bedtime. 09/11/21   Mozingo, Berdie Ogren, NP    Family History    Family History  Problem Relation Age of Onset   Other Father        Hypotension from medication causing GI bleed   Heart failure Father    Emphysema Mother    Prostate cancer Neg Hx    He indicated that his mother is deceased. He indicated that his father is deceased. He indicated that both of his brothers are  alive. He indicated that his maternal grandmother is deceased. He indicated that his maternal grandfather is deceased. He indicated that his paternal grandmother is deceased. He indicated that his paternal grandfather is deceased. He indicated that the status of his neg hx is unknown.  Social History    Social History   Socioeconomic History   Marital status: Single    Spouse name: Not on file   Number of children: 0   Years of education: Not on file   Highest education level: Not on file  Occupational History   Occupation: Environmental manager  Tobacco Use   Smoking status: Former    Packs/day: 1.50    Years: 25.00    Pack years: 37.50    Types: Cigarettes    Quit date: 08/26/2004    Years since quitting: 17.2   Smokeless tobacco: Never  Vaping Use   Vaping Use: Never used  Substance and Sexual Activity   Alcohol use: No    Alcohol/week: 0.0 standard drinks    Comment: Quit 2009   Drug use: Not Currently    Frequency: 2.0 times per week    Types: Marijuana    Comment: MJ-regular use  10/2013 no longer uses   Sexual activity: Yes  Other Topics Concern   Not on file  Social History Narrative   Environmental manager   Single; 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   Social Determinants of Health   Financial Resource Strain: Not on  file  Food Insecurity: Not on file  Transportation Needs: Not on file  Physical Activity: Not on file  Stress: Not on file  Social Connections: Not on file  Intimate Partner Violence: Not on file     Review of Systems    General:  No chills, fever, night sweats or weight changes.  Cardiovascular:  No chest pain, dyspnea on exertion, edema, orthopnea, palpitations, paroxysmal nocturnal dyspnea. Dermatological: No rash, lesions/masses Respiratory: No cough, dyspnea Urologic: No hematuria, dysuria Abdominal:   No nausea, vomiting, diarrhea, bright red blood per rectum, melena, or hematemesis Neurologic:  No visual changes, wkns, changes in mental status. All other systems reviewed and are otherwise negative except as noted above.  Physical Exam    VS:  BP 122/74 (BP Location: Left Arm, Patient Position: Sitting, Cuff Size: Normal)    Pulse 65    Ht '6\' 2"'$  (1.88 m)    Wt 210 lb 12.8 oz (95.6 kg)    BMI 27.07 kg/m  , BMI Body mass index is 27.07 kg/m. GEN: Well nourished, well developed, in no acute distress. HEENT: normal. Neck: Supple, no JVD, carotid bruits, or masses. Cardiac: RRR, no murmurs, rubs, or gallops. No clubbing, cyanosis, edema.  Radials/DP/PT 2+ and equal bilaterally.  Respiratory:  Respirations regular and unlabored, clear to auscultation bilaterally. GI: Soft, nontender, nondistended, BS + x 4. MS: no deformity or atrophy. Skin: warm and dry, no rash. Neuro:  Strength and sensation are intact. Psych: Normal affect.  Accessory Clinical Findings    Recent Labs: No results found for requested labs within last 8760 hours.   Recent Lipid Panel    Component Value Date/Time   CHOL 174 01/03/2020 0910   TRIG 93.0 01/03/2020 0910   HDL 39.90 01/03/2020 0910   CHOLHDL 4 01/03/2020 0910   VLDL 18.6 01/03/2020 0910   LDLCALC 116 (H) 01/03/2020 0910   LDLDIRECT 158.7 08/29/2008 1235    ECG personally reviewed by me today-sinus bradycardia with nonspecific  intraventricular conduction delay 53 bpm- No acute  changes  Cardiac event monitor 03/17/2019  Dominant rhythm is normal sinus rhythm with normal circvadian rhythm. Occasional monomorphic PVCs are seen, often in a pattern of bigeminy. No evidence of ventricular tachycardia or atrial fibrillation. Symptoms appear to coincide with periods of ventricular bigeminy.   Occasional symptomatic PVCs in a pattern of bigeminy. Otherwise normal arrhythmia monitor.  Assessment & Plan   1. Dyslipidemia-LDL 116 01/03/20. Heart healthy low-sodium high-fiber diet Increase physical activity as tolerated Order Direct LDL  Lower extremity DVT-reports compliance with Xarelto.  Denies bleeding issues.  History of lower extremity DVT-lifelong anticoagulation Continue Xarelto  Chronic depression with anxiety-mood stable.  Reports compliance with sertraline.  Continues to struggle with depression. Continue sertraline Mindfulness stress reduction sheet given Increase physical activity as tolerated Follows with PCP  Disposition: Follow-up with Dr. Sallyanne Kuster or me in 12 months.   Jossie Ng. Danarius Mcconathy NP-C    11/05/2021, 9:44 AM [place] [address] Office [phone] Fax [phone]  Notice: This dictation was prepared with Dragon dictation along with smaller phrase technology. Any transcriptional errors that result from this process are unintentional and may not be corrected upon review.  I spent 14 minutes examining this patient, reviewing medications, and using patient centered shared decision making involving her cardiac care.  Prior to her visit I spent greater than 20 minutes reviewing her past medical history,  medications, and prior cardiac tests.

## 2021-11-05 ENCOUNTER — Encounter: Payer: Self-pay | Admitting: General Practice

## 2021-11-05 ENCOUNTER — Ambulatory Visit: Payer: Medicare Other | Admitting: General Practice

## 2021-11-05 ENCOUNTER — Other Ambulatory Visit: Payer: Self-pay

## 2021-11-05 VITALS — BP 122/74 | HR 65 | Ht 74.0 in | Wt 210.8 lb

## 2021-11-05 DIAGNOSIS — E782 Mixed hyperlipidemia: Secondary | ICD-10-CM

## 2021-11-05 DIAGNOSIS — Z86718 Personal history of other venous thrombosis and embolism: Secondary | ICD-10-CM

## 2021-11-05 DIAGNOSIS — F419 Anxiety disorder, unspecified: Secondary | ICD-10-CM | POA: Diagnosis not present

## 2021-11-05 DIAGNOSIS — R002 Palpitations: Secondary | ICD-10-CM | POA: Diagnosis not present

## 2021-11-05 DIAGNOSIS — F32A Depression, unspecified: Secondary | ICD-10-CM

## 2021-11-05 MED ORDER — RIVAROXABAN 20 MG PO TABS
20.0000 mg | ORAL_TABLET | Freq: Every day | ORAL | 12 refills | Status: DC
Start: 2021-11-05 — End: 2021-12-10

## 2021-11-05 NOTE — Patient Instructions (Signed)
Medication Instructions:  ?The current medical regimen is effective;  continue present plan and medications as directed. Please refer to the Current Medication list given to you today.  ? ?*If you need a refill on your cardiac medications before your next appointment, please call your pharmacy* ? ?Lab Work:    ?DIRECT LDL      ? ?Special Instructions ?PLEASE READ AND FOLLOW SALTY 6-ATTACHED-1,'800mg'$  daily ? ?PLEASE INCREASE PHYSICAL ACTIVITY AS TOLERATED  ? ?PLEASE READ AND FOLLOW INCREASED FIBER DIET-ATTACHED ? ?Follow-Up: ?Your next appointment:  12 month(s) In Person with Sanda Klein, MD   ? ?Please call our office 2 months in advance to schedule this appointment  :1 ? ?At [facility], you and your health needs are our priority.  As part of our continuing mission to provide you with exceptional heart care, we have created designated Provider Care Teams.  These Care Teams include your primary Cardiologist (physician) and Advanced Practice Providers (APPs -  Physician Assistants and Nurse Practitioners) who all work together to provide you with the care you need, when you need it. ? ?We recommend signing up for the patient portal called "MyChart".  Sign up information is provided on this After Visit Summary.  MyChart is used to connect with patients for Virtual Visits (Telemedicine).  Patients are able to view lab/test results, encounter notes, upcoming appointments, etc.  Non-urgent messages can be sent to your provider as well.   ?To learn more about what you can do with MyChart, go to [email or web address].   ? ? ? ?        6 SALTY THINGS TO AVOID     1,'800MG'$  DAILY ? ? ? ? ?High-Fiber Eating Plan ?Fiber, also called dietary fiber, is a type of carbohydrate. It is found foods such as fruits, vegetables, whole grains, and beans. A high-fiber diet can have many health benefits. Your health care provider may recommend a high-fiber diet to help: ?Prevent constipation. Fiber can make your bowel movements more  regular. ?Lower your cholesterol. ?Relieve the following conditions: ?Inflammation of veins in the anus (hemorrhoids). ?Inflammation of specific areas of the digestive tract (uncomplicated diverticulosis). ?A problem of the large intestine, also called the colon, that sometimes causes pain and diarrhea (irritable bowel syndrome, or IBS). ?Prevent overeating as part of a weight-loss plan. ?Prevent heart disease, type 2 diabetes, and certain cancers. ?What are tips for following this plan? ?Reading food labels ? ?Check the nutrition facts label on food products for the amount of dietary fiber. Choose foods that have 5 grams of fiber or more per serving. ?The goals for recommended daily fiber intake include: ?Men (age 23 or younger): 34-38 g. ?Men (over age 1): 28-34 g. ?Women (age 59 or younger): 25-28 g. ?Women (over age 39): 22-25 g. ? ?Shopping ?Choose whole fruits and vegetables instead of processed forms, such as apple juice or applesauce. ?Choose a wide variety of high-fiber foods such as avocados, lentils, oats, and kidney beans. ?Read the nutrition facts label of the foods you choose. Be aware of foods with added fiber. These foods often have high sugar and sodium amounts per serving. ?Cooking ?Use whole-grain flour for baking and cooking. ?Cook with brown rice instead of white rice. ?Meal planning ?Start the day with a breakfast that is high in fiber, such as a cereal that contains 5 g of fiber or more per serving. ?Eat breads and cereals that are made with whole-grain flour instead of refined flour or white flour. ?Eat brown rice, bulgur  wheat, or millet instead of white rice. ?Use beans in place of meat in soups, salads, and pasta dishes. ?Be sure that half of the grains you eat each day are whole grains. ?General information ?You can get the recommended daily intake of dietary fiber by: ?Eating a variety of fruits, vegetables, grains, nuts, and beans. ?Taking a fiber supplement if you are not able to take  in enough fiber in your diet. It is better to get fiber through food than from a supplement. ?Gradually increase how much fiber you consume. If you increase your intake of dietary fiber too quickly, you may have bloating, cramping, or gas. ?Drink plenty of water to help you digest fiber. ?Choose high-fiber snacks, such as berries, raw vegetables, nuts, and popcorn. ?What foods should I eat? ?Fruits ?Berries. Pears. Apples. Oranges. Avocado. Prunes and raisins. Dried figs. ?Vegetables ?Sweet potatoes. Spinach. Kale. Artichokes. Cabbage. Broccoli. Cauliflower. Green peas. Carrots. Squash. ?Grains ?Whole-grain breads. Multigrain cereal. Oats and oatmeal. Brown rice. Barley. Bulgur wheat. [place]. Quinoa. Bran muffins. Popcorn. Rye wafer crackers. ?Meats and other proteins ?Navy beans, kidney beans, and pinto beans. Soybeans. Split peas. Lentils. Nuts and seeds. ?Dairy ?Fiber-fortified yogurt. ?Beverages ?Fiber-fortified soy milk. Fiber-fortified orange juice. ?Other foods ?Fiber bars. ?The items listed above may not be a complete list of recommended foods and beverages. Contact a dietitian for more information. ?What foods should I avoid? ?Fruits ?Fruit juice. Cooked, strained fruit. ?Vegetables ?Fried potatoes. Canned vegetables. Well-cooked vegetables. ?Grains ?White bread. Pasta made with refined flour. White rice. ?Meats and other proteins ?Fatty cuts of meat. Fried chicken or fried fish. ?Dairy ?Milk. Yogurt. Cream cheese. Sour cream. ?Fats and oils ?Butters. ?Beverages ?Soft drinks. ?Other foods ?Cakes and pastries. ?The items listed above may not be a complete list of foods and beverages to avoid. Talk with your dietitian about what choices are best for you. ?Summary ?Fiber is a type of carbohydrate. It is found in foods such as fruits, vegetables, whole grains, and beans. ?A high-fiber diet has many benefits. It can help to prevent constipation, lower blood cholesterol, aid weight loss, and reduce your risk of  heart disease, diabetes, and certain cancers. ?Increase your intake of fiber gradually. Increasing fiber too quickly may cause cramping, bloating, and gas. Drink plenty of water while you increase the amount of fiber you consume. ?The best sources of fiber include whole fruits and vegetables, whole grains, nuts, seeds, and beans. ?This information is not intended to replace advice given to you by your health care provider. Make sure you discuss any questions you have with your health care provider. ?Document Revised: 12/16/2019 Document Reviewed: 12/16/2019 ?Elsevier Patient Education ? 2022 [employer]. ? ? ?

## 2021-11-06 ENCOUNTER — Other Ambulatory Visit: Payer: Self-pay

## 2021-11-06 DIAGNOSIS — E782 Mixed hyperlipidemia: Secondary | ICD-10-CM

## 2021-11-06 LAB — LDL CHOLESTEROL, DIRECT: LDL Direct: 137 mg/dL — ABNORMAL HIGH (ref 0–99)

## 2021-12-06 ENCOUNTER — Other Ambulatory Visit: Payer: Self-pay

## 2021-12-06 DIAGNOSIS — G4700 Insomnia, unspecified: Secondary | ICD-10-CM

## 2021-12-06 MED ORDER — ZOLPIDEM TARTRATE ER 12.5 MG PO TBCR: 12.5000 mg | EXTENDED_RELEASE_TABLET | Freq: Every day | ORAL | 2 refills | Status: DC

## 2021-12-10 ENCOUNTER — Other Ambulatory Visit: Payer: Self-pay | Admitting: Cardiovascular Disease

## 2021-12-10 ENCOUNTER — Encounter: Payer: Self-pay | Admitting: Adult Health

## 2021-12-10 ENCOUNTER — Ambulatory Visit (INDEPENDENT_AMBULATORY_CARE_PROVIDER_SITE_OTHER): Payer: Medicare Other | Admitting: Adult Health

## 2021-12-10 DIAGNOSIS — R002 Palpitations: Secondary | ICD-10-CM

## 2021-12-10 DIAGNOSIS — G4700 Insomnia, unspecified: Secondary | ICD-10-CM | POA: Diagnosis not present

## 2021-12-10 DIAGNOSIS — F411 Generalized anxiety disorder: Secondary | ICD-10-CM

## 2021-12-10 DIAGNOSIS — F331 Major depressive disorder, recurrent, moderate: Secondary | ICD-10-CM

## 2021-12-10 DIAGNOSIS — Z86718 Personal history of other venous thrombosis and embolism: Secondary | ICD-10-CM

## 2021-12-10 MED ORDER — ZOLPIDEM TARTRATE ER 12.5 MG PO TBCR: 12.5000 mg | EXTENDED_RELEASE_TABLET | Freq: Every day | ORAL | 2 refills | Status: DC

## 2021-12-10 MED ORDER — SERTRALINE HCL 100 MG PO TABS: 100.0000 mg | ORAL_TABLET | Freq: Every day | ORAL | 5 refills | Status: DC

## 2021-12-10 MED ORDER — CLORAZEPATE DIPOTASSIUM 7.5 MG PO TABS: ORAL_TABLET | ORAL | 2 refills | Status: DC

## 2021-12-10 NOTE — Telephone Encounter (Signed)
Prescription refill request for Xarelto received.  ?Indication:DVT ?Last office visit:3/23 ?Weight:95.6 kg ?Age:71 ?RWC:HJSCB labs ?CrCl:needs labs ? ?Prescription refilled ? ?

## 2021-12-10 NOTE — Progress Notes (Addendum)
[name] ?[id number] ?1950/11/06 ?71 y.o. ? ?Virtual Visit via Telephone Note ? ?I connected with pt on 12/10/21 at  9:00 AM EDT by telephone and verified that I am speaking with the correct person using two identifiers. ?  ?I discussed the limitations, risks, security and privacy concerns of performing an evaluation and management service by telephone and the availability of in person appointments. I also discussed with the patient that there may be a patient responsible charge related to this service. The patient expressed understanding and agreed to proceed. ?  ?I discussed the assessment and treatment plan with the patient. The patient was provided an opportunity to ask questions and all were answered. The patient agreed with the plan and demonstrated an understanding of the instructions. ?  ?The patient was advised to call back or seek an in-person evaluation if the symptoms worsen or if the condition fails to improve as anticipated. ? ?I provided 25 minutes of non-face-to-face time during this encounter.  The patient was located at home.  The provider was located at [place]. ? ? ?Aloha Gell, NP ? ? ?Subjective:  ? ?Patient ID:  [name] is a 71 y.o. (DOB 1951-06-26) male. ? ?Chief Complaint: No chief complaint on file. ? ? ?HPI ?[name] presents for follow-up of insomnia, anxiety, and depression.  ? ?Describes mood today as "ok". Pleasant. Denies tearfulness. Mood symptoms - reports depression and anxiety. Denies irritability. Reporting situational stressors - cat needing surgery - car issues. Getting eye injections - macular degeneration.  Financial concerns receiving SSI benefits. Stating "I'm doing alright". Feels like medications are helpful. 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 Stable interest and motivation. Taking medications as prescribed.  ?Energy levels stable. Active, has a regular exercise routine. ?Enjoys some usual interests and activities. Single. Lives  with a roommate - 2 cats and a dog. Mostly staying home.  ?Appetite adequate. Has regained "some" sense of taste and smell. Weight fluctuates 185 from 210 pounds. ?Sleeps better some nights than others. Averages 8 hours. ?Focus and concentration stable. Completing tasks. Managing aspects of household. Works as a Environmental manager. ?Denies SI or HI.  ?Denies AH or VH. ? ?Previous medication trials: Zoloft, Ambien, Tranxene ? ? ?Review of Systems:  ?Review of Systems  ?Musculoskeletal:  Negative for gait problem.  ?Neurological:  Negative for tremors.  ?Psychiatric/Behavioral:    ?     Please refer to HPI  ? ?Medications: I have reviewed the patient's current medications. ? ?Current Outpatient Medications  ?Medication Sig Dispense Refill  ? clorazepate (TRANXENE) 7.5 MG tablet TAKE 1 TABLET(7.5 MG) BY MOUTH DAILY 30 tablet 2  ? HYDROcodone-acetaminophen (NORCO/VICODIN) 5-325 MG tablet Take 1 tablet by mouth every 6 (six) hours as needed for moderate pain. 15 tablet 0  ? ibuprofen (ADVIL,MOTRIN) 200 MG tablet Take 100-200 mg by mouth daily as needed for mild pain.    ? levothyroxine (SYNTHROID) 25 MCG tablet Take 1 tablet (25 mcg total) by mouth daily before breakfast. 90 tablet 1  ? Multiple Vitamin (MULTIVITAMIN WITH MINERALS) TABS tablet Take 1 tablet by mouth daily.    ? pantoprazole (PROTONIX) 40 MG tablet TAKE 1 TABLET(40 MG) BY MOUTH EVERY MORNING 30 MINUTES BEFORE BREAKFAST 30 tablet 11  ? PROLENSA 0.07 % SOLN Place 1 drop into the right eye See admin instructions.    ? rivaroxaban (XARELTO) 20 MG TABS tablet Take 1 tablet (20 mg total) by mouth daily with supper. 30 tablet 12  ? sertraline (  ZOLOFT) 100 MG tablet Take 1 tablet (100 mg total) by mouth daily. 30 tablet 5  ? SIMBRINZA 1-0.2 % SUSP Place 1 drop into both eyes 3 (three) times daily.    ? zolpidem (AMBIEN CR) 12.5 MG CR tablet Take 1 tablet (12.5 mg total) by mouth at bedtime. 30 tablet 2  ? ?Current Facility-Administered Medications  ?Medication Dose  Route Frequency Provider Last Rate Last Admin  ? ciprofloxacin (CIPRO) tablet 500 mg  500 mg Oral Once Alyson Ingles Candee Furbish, MD      ? ? ?Medication Side Effects: None ? ?Allergies:  ?Allergies  ?Allergen Reactions  ? Crestor [Rosuvastatin]   ?  Mood changes  ? Escitalopram Oxalate   ?  Mood changes, irritablity  ? Latex Other (See Comments)  ?  Blood in urine with latex catheter- but this was likely from mechanical irritation, not from the latex itself  ? Sulfonamide Derivatives Rash  ? ? ?Past Medical History:  ?Diagnosis Date  ? Alcohol abuse, unspecified   ? history of  ? Anxiety   ? Arthritis   ? all over  ? Bladder cancer [facility]) 2011  ? Dr. Janice Norrie  ? CAD (coronary artery disease) 2012  ? MI  ? Depression   ? Dyslipidemia   ? GERD (gastroesophageal reflux disease)   ? History of DVT (deep vein thrombosis) 11/08 and again in 2014  ? previously on coumadin  ? Hypertension   ? Hypothyroidism   ? Inguinal hernia without mention of obstruction or gangrene, unilateral or unspecified, (not specified as recurrent)   ? Internal hemorrhoids without mention of complication   ? Myocardial infarction [facility])   ? 2011   ? Other abnormal glucose   ? Personal history of other disorder of urinary system   ? Pneumonia   ? hx of 12/2019   ? Pulmonary embolism ([place])   ? hx of   ? Stroke ([place]) 05/25/2005  ? TIA  ? Thrombocytopenia, unspecified ([place])   ? TIA (transient ischemic attack) 2008  ? ? ?Family History  ?Problem Relation Age of Onset  ? Other Father   ?     Hypotension from medication causing GI bleed  ? Heart failure Father   ? Emphysema Mother   ? Prostate cancer Neg Hx   ? ? ?Social History  ? ?Socioeconomic History  ? Marital status: Single  ?  Spouse name: Not on file  ? Number of children: 0  ? Years of education: Not on file  ? Highest education level: Not on file  ?Occupational History  ? Occupation: Environmental manager  ?Tobacco Use  ? Smoking status: Former  ?  Packs/day: 1.50  ?  Years: 25.00  ?  Pack years: 37.50  ?  Types:  Cigarettes  ?  Quit date: 08/26/2004  ?  Years since quitting: 17.3  ? Smokeless tobacco: Never  ?Vaping Use  ? Vaping Use: Never used  ?Substance and Sexual Activity  ? Alcohol use: No  ?  Alcohol/week: 0.0 standard drinks  ?  Comment: Quit 2009  ? Drug use: Not Currently  ?  Frequency: 2.0 times per week  ?  Types: Marijuana  ?  Comment: MJ-regular use  10/2013 no longer uses  ? Sexual activity: Yes  ?Other Topics Concern  ? Not on file  ?Social History Narrative  ? Piano tuner  ? Single; 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  ? ?Social Determinants of Health  ? ?Financial Resource Strain: Not on file  ?  Food Insecurity: Not on file  ?Transportation Needs: Not on file  ?Physical Activity: Not on file  ?Stress: Not on file  ?Social Connections: Not on file  ?Intimate Partner Violence: Not on file  ? ? ?Past Medical History, Surgical history, Social history, and Family history were reviewed and updated as appropriate.  ? ?Please see review of systems for further details on the patient's review from today.  ? ?Objective:  ? ?Physical Exam:  ?There were no vitals taken for this visit. ? ?Physical Exam ?Constitutional:   ?   General: He is not in acute distress. ?Musculoskeletal:     ?   General: No deformity.  ?Neurological:  ?   Mental Status: He is alert and oriented to person, place, and time.  ?   Coordination: Coordination normal.  ?Psychiatric:     ?   Attention and Perception: Attention and perception normal. He does not perceive auditory or visual hallucinations.     ?   Mood and Affect: Mood normal. Mood is not anxious or depressed. Affect is not labile, blunt, angry or inappropriate.     ?   Speech: Speech normal.     ?   Behavior: Behavior normal.     ?   Thought Content: Thought content normal. Thought content is not paranoid or delusional. Thought content does not include homicidal or suicidal ideation. Thought content does not include homicidal or suicidal plan.     ?   Cognition and Memory: Cognition and memory  normal.     ?   Judgment: Judgment normal.  ?   Comments: Insight intact  ? ? ?Lab Review:  ?   ?Component Value Date/Time  ? NA 136 11/01/2020 1043  ? NA 137 02/18/2019 1549  ? K 4.1 11/01/2020 1043  ? CL

## 2021-12-21 ENCOUNTER — Telehealth: Payer: Self-pay

## 2021-12-21 ENCOUNTER — Ambulatory Visit (INDEPENDENT_AMBULATORY_CARE_PROVIDER_SITE_OTHER): Payer: Medicare Other | Admitting: Family

## 2021-12-21 ENCOUNTER — Encounter: Payer: Self-pay | Admitting: Family

## 2021-12-21 VITALS — BP 118/76 | HR 73 | Temp 98.1°F | Resp 16 | Ht 74.0 in | Wt 206.0 lb

## 2021-12-21 DIAGNOSIS — J029 Acute pharyngitis, unspecified: Secondary | ICD-10-CM | POA: Insufficient documentation

## 2021-12-21 DIAGNOSIS — J0110 Acute frontal sinusitis, unspecified: Secondary | ICD-10-CM | POA: Diagnosis not present

## 2021-12-21 MED ORDER — AMOXICILLIN-POT CLAVULANATE 875-125 MG PO TABS: 1.0000 | ORAL_TABLET | Freq: Two times a day (BID) | ORAL | 0 refills | Status: DC

## 2021-12-21 NOTE — Telephone Encounter (Signed)
Saw patient already, thank you for speaking with the patient.  Please see note for further information.  

## 2021-12-21 NOTE — Assessment & Plan Note (Signed)
Prescription given for augmentin 875/125 mg po bid for ten days. Pt to continue tylenol/ibuprofen prn sinus pain. Continue with humidifier prn and steam showers recommended as well. instructed If no symptom improvement in 48 hours please f/u ? ?

## 2021-12-21 NOTE — Patient Instructions (Signed)
Recommend daily flonase for your allergies.  ? ?Start prescription of augmentin 875/125 mg and take as prescribed.  ?Tylenol/ibuprofen ok for sinus pain as needed ?Increase oral fluids. Ok to continue with humidifers and hot steamy showers as discussed during visit. ? ?It was a pleasure speaking with you today, I hope you start feeling better soon. ? ?Regards,  ? ?[name] ? ?

## 2021-12-21 NOTE — Assessment & Plan Note (Signed)
Strep tested in office, negative.  ? ? ?

## 2021-12-21 NOTE — Telephone Encounter (Signed)
I spoke with pt who said his throat has been sore on one side or the other for about 3 wks. Yesterday the sorethroat was worse but pt has been gargling and feels some better but throat is still sore and pt scheduled appt with T Dugal FNP 12/20/21 at 9:20. Sending note to Red Christians FNP. ?

## 2021-12-21 NOTE — Progress Notes (Signed)
? ?Established Patient Office Visit ? ?Subjective:  ?Patient ID: [name], male    DOB: [birth year]  Age: 71 y.o. MRN: [id number] ? ?CC:  ?Chief Complaint  ?Patient presents with  ? Sore Throat  ?  X 3 weeks on left side then moved to right side  ? Cough  ?  X 3 weeks   ? Ear Fullness  ?  Has ringing in them  ? ? ?HPI ?[name] is here today with concerns.  ? ?Three weeks ago with left sided sore throat, felt a catch when he was swallowing. That has resolved but now on the right side of the throat, and tender when he swallows.  ? ?Not getting things caught in the throat.  ?Does have some ear fullness/ tinnitus is exacerbated.  ? ?Some nasal congestion and sinus pressure/pain ?Also with productive cough with clear sputum.  ?Felt some chest tightness a few days ago but not since.  ?No wheezing or sob.  ? ?Past Medical History:  ?Diagnosis Date  ? Alcohol abuse, unspecified   ? history of  ? Anxiety   ? Arthritis   ? all over  ? Bladder cancer [facility]) 2011  ? Dr. Janice Norrie  ? CAD (coronary artery disease) 2012  ? MI  ? Depression   ? Dyslipidemia   ? GERD (gastroesophageal reflux disease)   ? History of DVT (deep vein thrombosis) 11/08 and again in 2014  ? previously on coumadin  ? Hypertension   ? Hypothyroidism   ? Inguinal hernia without mention of obstruction or gangrene, unilateral or unspecified, (not specified as recurrent)   ? Internal hemorrhoids without mention of complication   ? Myocardial infarction [facility])   ? 2011   ? Other abnormal glucose   ? Personal history of other disorder of urinary system   ? Pneumonia   ? hx of 12/2019   ? Pulmonary embolism ([place])   ? hx of   ? Stroke ([place]) 05/25/2005  ? TIA  ? Thrombocytopenia, unspecified ([place])   ? TIA (transient ischemic attack) 2008  ? ? ?Past Surgical History:  ?Procedure Laterality Date  ? CARDIAC CATHETERIZATION  05/2011  ? mild atherosclerosis  ? CYSTOSCOPY  07/12/2011  ? Procedure: CYSTOSCOPY;  Surgeon: Hanley Ben, MD;  Location: [facility];   Service: Urology;  Laterality: N/A;  one hour being requested for this case  ? EYE SURGERY Left   ? 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 HERNIA REPAIR  8/11  ? repair of right indirect and direct hernias; incarerated umbilical hernia containing preperitoneal fat  ? INGUINAL HERNIA REPAIR Left 11/14/2020  ? Procedure: OPEN LEFT INGUINAL HERNIA REPAIR WITH MESH;  Surgeon: Johnathan Hausen, MD;  Location: [facility];  Service: General;  Laterality: Left;  90 MIN  ? SHOULDER SURGERY  03/08/08  ? Left; arthroscopy  ? TONSILLECTOMY    ? as child  ? TRANSURETHRAL RESECTION OF BLADDER TUMOR  07/12/2011  ? Procedure: TRANSURETHRAL RESECTION OF BLADDER TUMOR (TURBT);  Surgeon: Hanley Ben, MD;  Location: [facility];  Service: Urology;  Laterality: N/A;  ? ? ?Family History  ?Problem Relation Age of Onset  ? Other Father   ?     Hypotension from medication causing GI bleed  ? Heart failure Father   ? Emphysema Mother   ? Prostate cancer Neg Hx   ? ? ?Social History  ? ?Socioeconomic History  ? Marital status: Single  ?  Spouse name: Not on  file  ? Number of children: 0  ? Years of education: Not on file  ? Highest education level: Not on file  ?Occupational History  ? Occupation: Environmental manager  ?Tobacco Use  ? Smoking status: Former  ?  Packs/day: 1.50  ?  Years: 25.00  ?  Pack years: 37.50  ?  Types: Cigarettes  ?  Quit date: 08/26/2004  ?  Years since quitting: 17.3  ? Smokeless tobacco: Never  ?Vaping Use  ? Vaping Use: Never used  ?Substance and Sexual Activity  ? Alcohol use: No  ?  Alcohol/week: 0.0 standard drinks  ?  Comment: Quit 2009  ? Drug use: Not Currently  ?  Frequency: 2.0 times per week  ?  Types: Marijuana  ?  Comment: MJ-regular use  10/2013 no longer uses  ? Sexual activity: Yes  ?Other Topics Concern  ? Not on file  ?Social History Narrative  ? Piano tuner  ? Single; 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  ? ?Social Determinants of Health  ? ?Financial Resource Strain: Not on file  ?Food Insecurity:  Not on file  ?Transportation Needs: Not on file  ?Physical Activity: Not on file  ?Stress: Not on file  ?Social Connections: Not on file  ?Intimate Partner Violence: Not on file  ? ? ?Outpatient Medications Prior to Visit  ?Medication Sig Dispense Refill  ? clorazepate (TRANXENE) 7.5 MG tablet TAKE 1 TABLET(7.5 MG) BY MOUTH DAILY 30 tablet 2  ? HYDROcodone-acetaminophen (NORCO/VICODIN) 5-325 MG tablet Take 1 tablet by mouth every 6 (six) hours as needed for moderate pain. 15 tablet 0  ? ibuprofen (ADVIL,MOTRIN) 200 MG tablet Take 100-200 mg by mouth daily as needed for mild pain.    ? levothyroxine (SYNTHROID) 25 MCG tablet Take 1 tablet (25 mcg total) by mouth daily before breakfast. 90 tablet 1  ? Multiple Vitamin (MULTIVITAMIN WITH MINERALS) TABS tablet Take 1 tablet by mouth daily.    ? pantoprazole (PROTONIX) 40 MG tablet TAKE 1 TABLET(40 MG) BY MOUTH EVERY MORNING 30 MINUTES BEFORE BREAKFAST 30 tablet 11  ? PROLENSA 0.07 % SOLN Place 1 drop into the right eye See admin instructions.    ? rivaroxaban (XARELTO) 20 MG TABS tablet Take 1 tablet (20 mg total) by mouth daily with supper. NEEDS LABS, COME TO OFFICE 15 tablet 0  ? sertraline (ZOLOFT) 100 MG tablet Take 1 tablet (100 mg total) by mouth daily. 30 tablet 5  ? SIMBRINZA 1-0.2 % SUSP Place 1 drop into both eyes 3 (three) times daily.    ? zolpidem (AMBIEN CR) 12.5 MG CR tablet Take 1 tablet (12.5 mg total) by mouth at bedtime. 30 tablet 2  ? ciprofloxacin (CIPRO) tablet 500 mg     ? ?No facility-administered medications prior to visit.  ? ? ?Allergies  ?Allergen Reactions  ? Crestor [Rosuvastatin]   ?  Mood changes  ? Escitalopram Oxalate   ?  Mood changes, irritablity  ? Latex Other (See Comments)  ?  Blood in urine with latex catheter- but this was likely from mechanical irritation, not from the latex itself  ? Sulfonamide Derivatives Rash  ? ? ?ROS ?Review of Systems  ?Constitutional:  Positive for fatigue. Negative for chills and fever.  ?HENT:   Positive for congestion, postnasal drip, sinus pressure, sinus pain, sore throat and tinnitus. Negative for ear pain.   ?Respiratory:  Positive for cough (productive cough with clear sputum). Negative for shortness of breath and wheezing.   ?Cardiovascular:  Negative  for chest pain and palpitations.  ? ?  ?Objective:  ?  ?Physical Exam ?Constitutional:   ?   General: He is awake. He is not in acute distress. ?   Appearance: Normal appearance. He is well-developed and normal weight. He is not ill-appearing, toxic-appearing or diaphoretic.  ?HENT:  ?   Head: Normocephalic.  ?   Right Ear: Tympanic membrane normal.  ?   Left Ear: Tympanic membrane normal.  ?   Nose: Mucosal edema, congestion and rhinorrhea present. No nasal tenderness.  ?   Right Turbinates: Enlarged and pale. Not swollen.  ?   Left Turbinates: Enlarged and pale. Not swollen.  ?   Right Sinus: Frontal sinus tenderness present. No maxillary sinus tenderness.  ?   Left Sinus: No maxillary sinus tenderness or frontal sinus tenderness.  ?   Mouth/Throat:  ?   Mouth: Mucous membranes are moist.  ?   Dentition: Normal dentition.  ?   Tongue: No lesions.  ?   Pharynx: Posterior oropharyngeal erythema present. No pharyngeal swelling or oropharyngeal exudate.  ?   Tonsils: No tonsillar exudate. 0 on the right. 0 on the left.  ?Eyes:  ?   Extraocular Movements: Extraocular movements intact.  ?   Pupils: Pupils are equal, round, and reactive to light.  ?Cardiovascular:  ?   Rate and Rhythm: Normal rate and regular rhythm.  ?Pulmonary:  ?   Effort: Pulmonary effort is normal.  ?   Breath sounds: Normal breath sounds. No wheezing.  ?Musculoskeletal:  ?   Cervical back: Normal range of motion.  ?Neurological:  ?   Mental Status: He is alert.  ? ? ?BP 118/76   Pulse 73   Temp 98.1 ?F (36.7 ?C)   Resp 16   Ht '6\' 2"'$  (1.88 m)   Wt 206 lb (93.4 kg)   SpO2 96%   BMI 26.45 kg/m?  ?Wt Readings from Last 3 Encounters:  ?12/21/21 206 lb (93.4 kg)  ?11/05/21 210 lb 12.8  oz (95.6 kg)  ?11/27/20 199 lb 8 oz (90.5 kg)  ? ? ? ?Health Maintenance Due  ?Topic Date Due  ? Hepatitis C Screening  Never done  ? Zoster Vaccines- Shingrix (1 of 2) Never done  ? Pneumonia Vaccine 40+ Years old (

## 2021-12-21 NOTE — Telephone Encounter (Signed)
[facility] - Client ?TELEPHONE ADVICE RECORD ?[facility]? ?Patient ?Name: ?Philip BU ?Brown ?Gender: Male ?DOB: 1950-09-22 ?Age: 71 Y 64 M 13 D ?Return ?Phone ?Number: ?[id number] ?(Primary), ?[id number] ?(Secondary) ?Address: ?City/ ?State/ ?Zip: [place] ? [address] ?Client [facility] - Client ?Client Site Champ ?Provider Renford Dills - MD ?Contact Type Call ?Who Is Calling Patient / Member / Family / Caregiver ?Call Type Triage / Clinical ?Relationship To Patient Self ?Return Phone Number [phone] (Primary) ?Chief Complaint Sore Throat ?Reason for Call Request to Schedule Office Appointment ?Initial Comment Caller would like to know if he is able to be seen ?tomorrow because he has had a sore throat that he ?has had for about 3 weeks ?Translation No ?Nurse Assessment ?Nurse: Ronnald Ramp, RN, [facility] Fare Date/Time (Eastern Time): 12/20/2021 5:06:40 PM ?Confirm and document reason for call. If ?symptomatic, describe symptoms. ?---Caller would like to know if he is able to be seen ?tomorrow because he has had a sore throat that he has ?had for about 3 weeks ?Does the patient have any new or worsening ?symptoms? ---Yes ?Will a triage be completed? ---Yes ?Related visit to physician within the last 2 weeks? ---No ?Does the PT have any chronic conditions? (i.e. ?diabetes, asthma, this includes High risk factors for ?pregnancy, etc.) ?---No ?Is this a behavioral health or substance abuse call? ---No ?Guidelines ?Guideline Title Affirmed Question Affirmed Notes Nurse Date/Time (Eastern ?Time) ?Sore Throat [1] Sore throat is the ?only symptom AND ?[2] present > 48 hours ?Ronnald Ramp, RN, [facility] 12/20/2021 5:07:09 ?PM ?Disp. Time (Eastern ?Time) Disposition Final User ?12/20/2021 5:11:11 PM SEE PCP WITHIN 3 DAYS Yes Ronnald Ramp, RN, [facility] Fare ?Caller Disagree/Comply Comply ?PLEASE NOTE: All timestamps contained within this report are represented as  [place] Standard Time. ?CONFIDENTIALTY NOTICE: This fax transmission is intended only for the addressee. It contains information that is legally privileged, confidential or ?otherwise protected from use or disclosure. If you are not the intended recipient, you are strictly prohibited from reviewing, disclosing, copying using ?or disseminating any of this information or taking any action in reliance on or regarding this information. If you have received this fax in error, please ?notify us immediately by telephone so that we can arrange for its return to [place]. Phone: [phone], Toll-Free: [phone], Fax: [phone] ?Page: 2 of 2 ?Call Id: [id number] ?Caller Understands Yes ?PreDisposition Call Doctor ?Care Advice Given Per Guideline ?SEE PCP WITHIN 3 DAYS: * You need to be seen within 2 or 3 days. * PCP VISIT: Call your doctor (or NP/PA) during regular ?office hours and make an appointment. A clinic or urgent care center are good places to go for care if your doctor's office is closed ?or you can't get an appointment. NOTE: If office will be open tomorrow, tell caller to call then, not in 3 days. PAIN AND FEVER ?MEDICINES: * ACETAMINOPHEN - REGULAR STRENGTH TYLENOL: Take 650 mg (two 325 mg pills) by mouth every 4 ?to 6 hours as needed. Each Regular Strength Tylenol pill has 325 mg of acetaminophen. The most you should take is 10 pills a day ?(3,250 mg total). Note: In [place], the maximum is 12 pills a day (3,900 mg total). SOFT DIET: * Eat a soft diet. * Cold drinks, ?popsicles, and milk shakes are especially good. Avoid citrus fruits. CALL BACK IF: * You become worse * Gargle with warm salt ?water four times a day. To make salt water, put 1/2 teaspoon of salt  in 8 oz (240 ml) of warm water ?

## 2021-12-31 ENCOUNTER — Telehealth: Payer: Self-pay | Admitting: Family Medicine

## 2021-12-31 NOTE — Telephone Encounter (Signed)
Left message for patient to call back and schedule Medicare Annual Wellness Visit (AWV) to be completed by video or phone. ? ? ? ?Last AWV: 12/24/2019 ? ? ? ?Please schedule at anytime with  ?[facility]    ? ? ? ?45 minute appointment ? ? ? ?Any questions, please contact me at [phone] ?

## 2022-01-17 ENCOUNTER — Telehealth: Payer: Self-pay | Admitting: Cardiovascular Disease

## 2022-01-17 NOTE — Telephone Encounter (Signed)
*  STAT* If patient is at the pharmacy, call can be transferred to refill team.   1. Which medications need to be refilled? (please list name of each medication and dose if known) rivaroxaban (XARELTO) 20 MG TABS tablet  2. Which pharmacy/location (including street and city if local pharmacy) is medication to be sent to? [facility] [phone] - [place], [place] - [place]  3. Do they need a 30 day or 90 day supply? 30  Pt states that he needs a call back in regards to this med. He was told that he needed labs done before he could get more, but he hasn't been able to secure a ride to get labs done. Says there have been issues and he hasn't had this medication since Saturday and he is having panic attacks due to not having this med. Please advise.

## 2022-01-18 ENCOUNTER — Other Ambulatory Visit: Payer: Self-pay

## 2022-01-18 MED ORDER — RIVAROXABAN 20 MG PO TABS: 20.0000 mg | ORAL_TABLET | Freq: Every day | ORAL | 5 refills | Status: DC

## 2022-01-19 IMAGING — CR DG CHEST 2V
2 series · 2 of 2 positions shown · non-contrast
Comparison: Chest x-ray 01/13/2020.

CLINICAL DATA: Pneumonia.

EXAM:
CHEST - 2 VIEW

[chest pa]
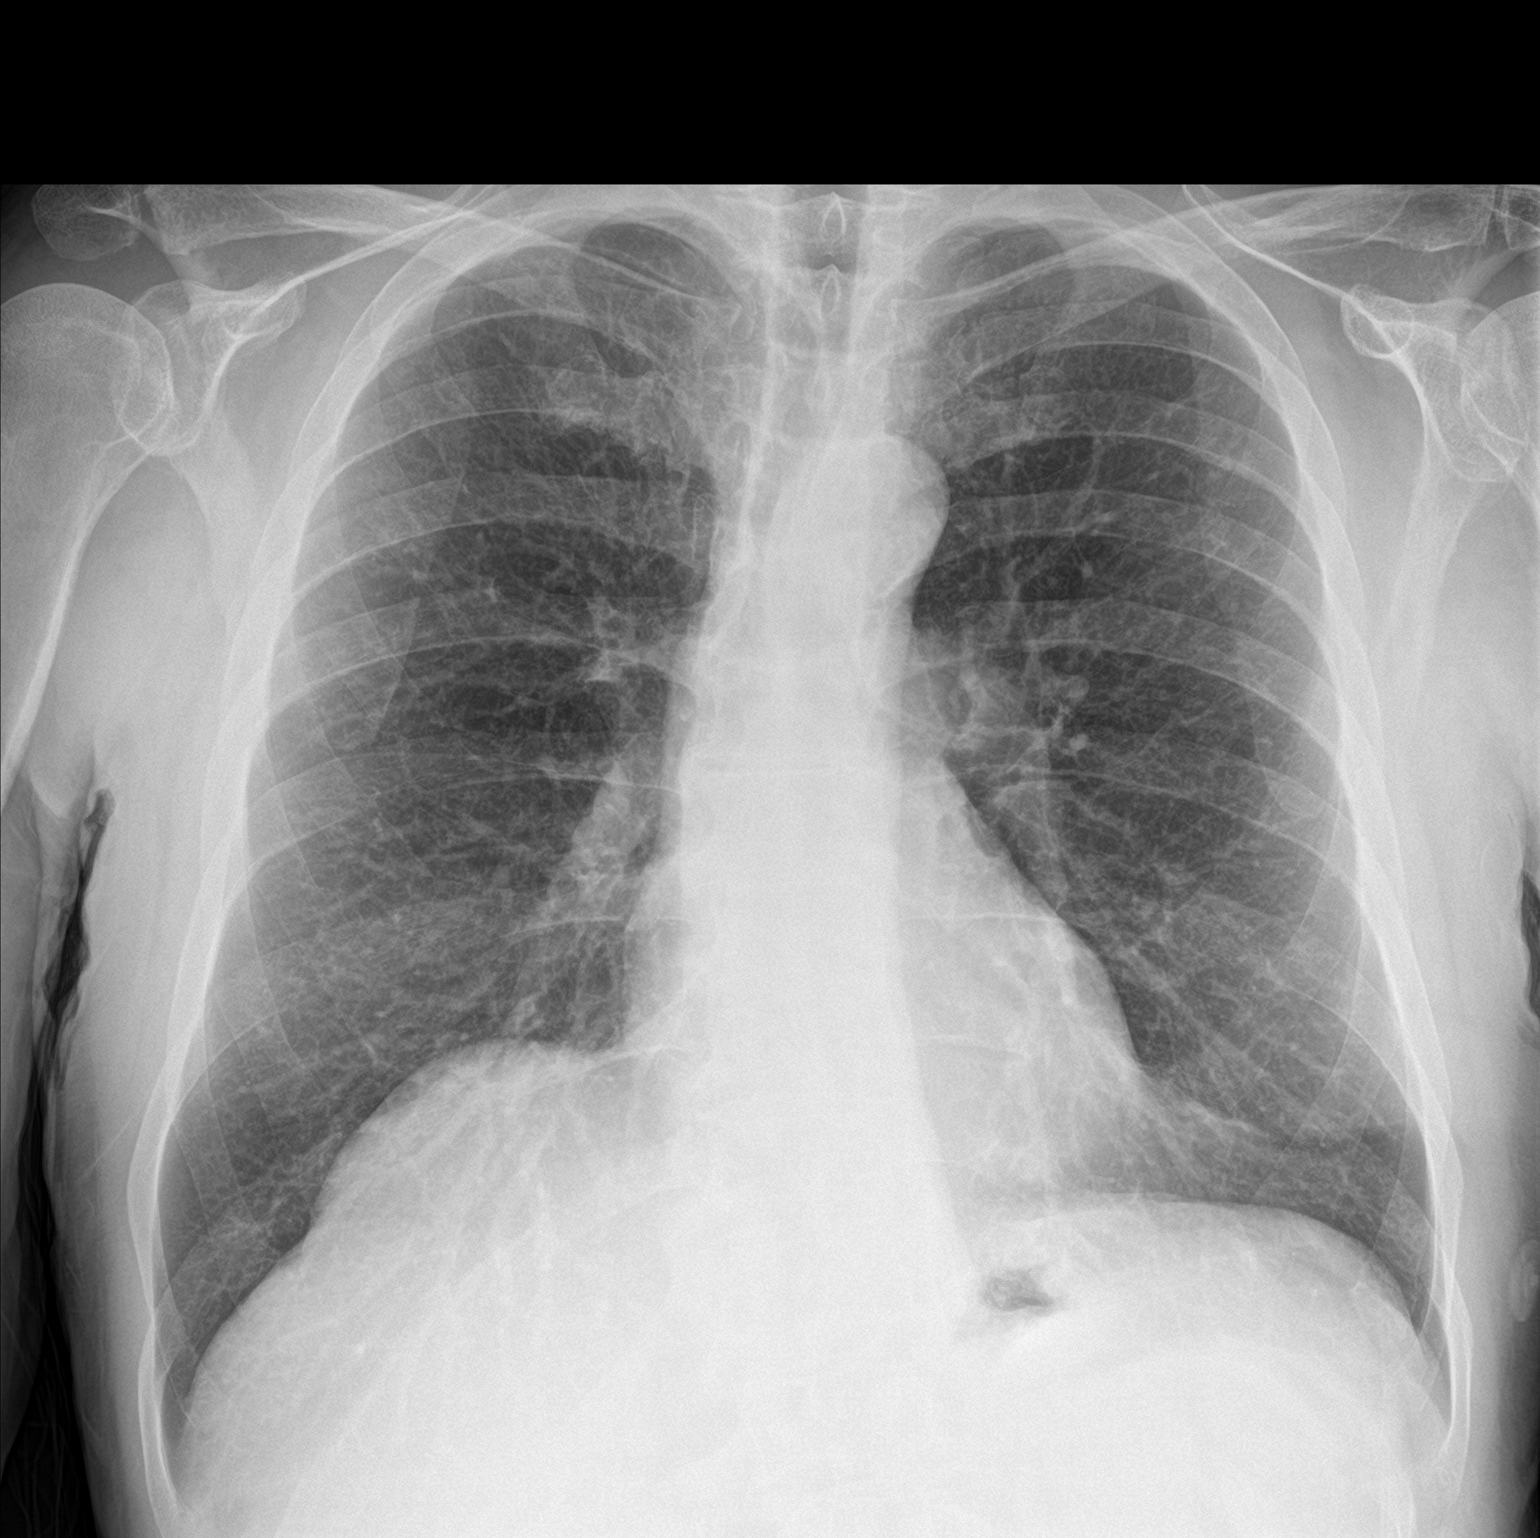

[chest lat]
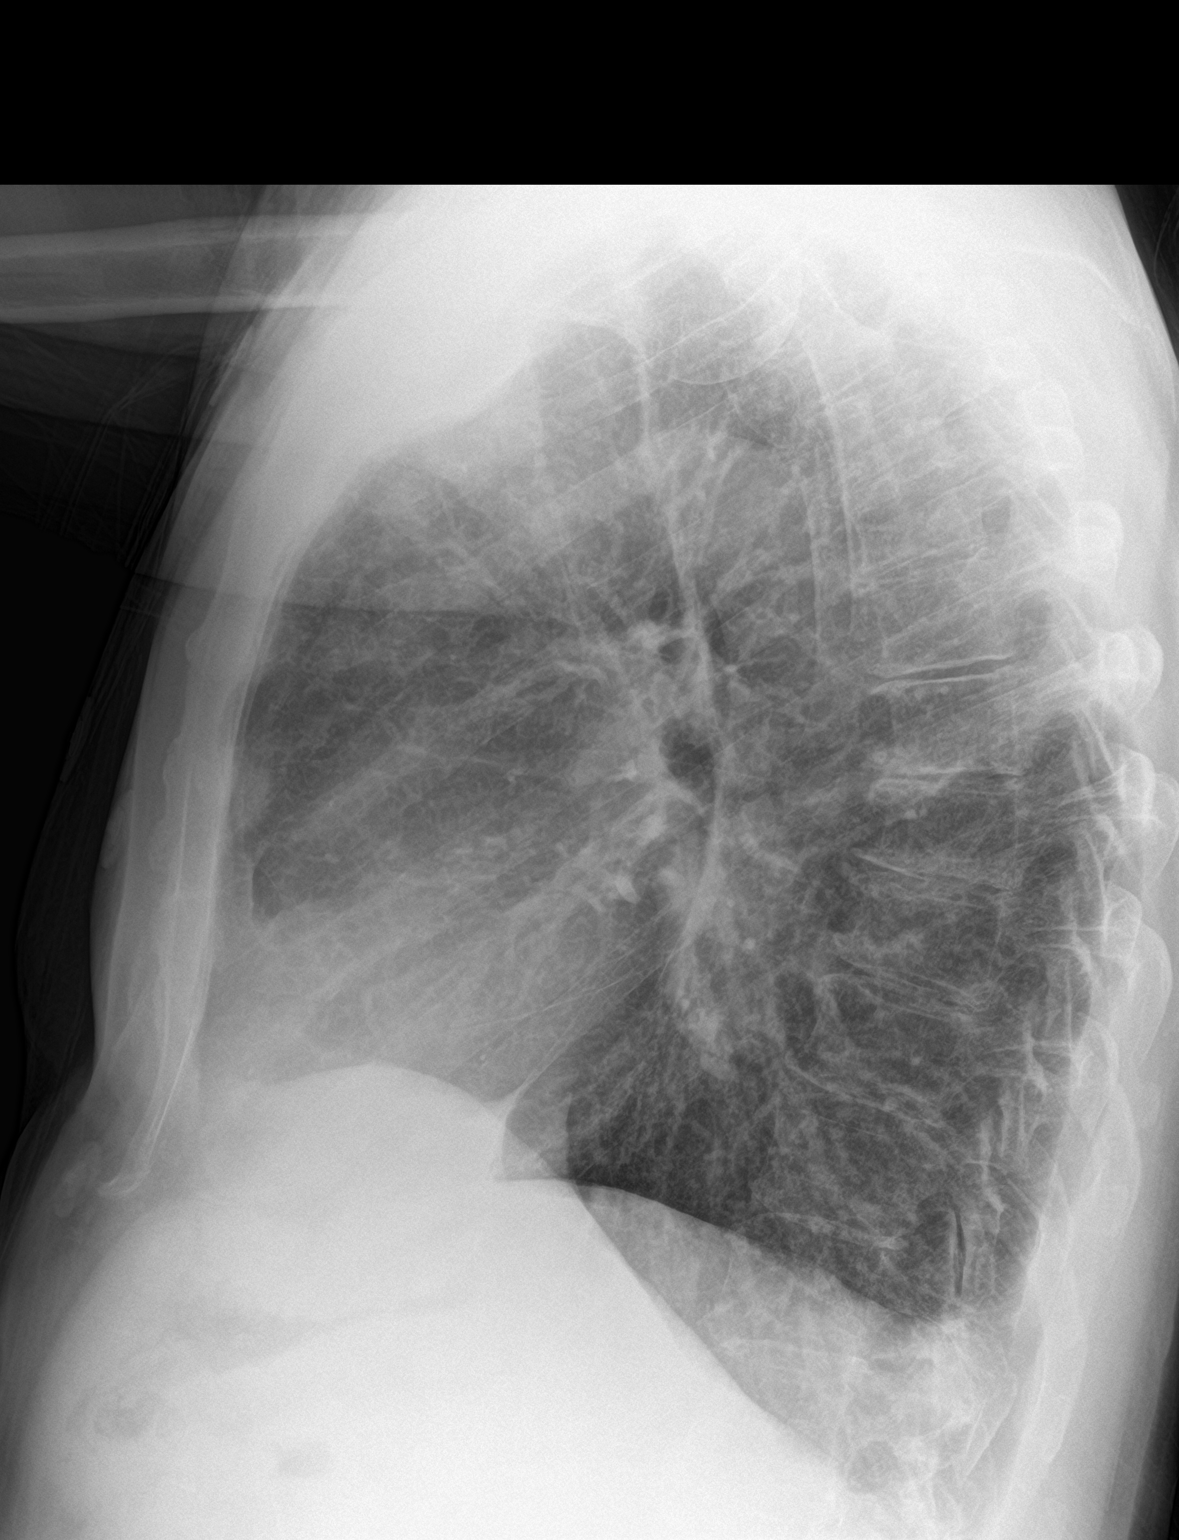

[2 of 2 positions shown; findings below may reference images not displayed]

FINDINGS: Mediastinum and hilar structures normal. Heart size normal. Mild
left base subsegmental atelectasis again noted, improved from prior
exam. No pleural effusion or pneumothorax. Thoracic spine scoliosis
and degenerative change.
IMPRESSION: Mild left base subsegmental atelectasis again noted, improved from
prior exam. No acute abnormality identified.

## 2022-02-04 ENCOUNTER — Other Ambulatory Visit: Payer: Self-pay | Admitting: Physician Assistant

## 2022-02-19 ENCOUNTER — Telehealth: Payer: Self-pay | Admitting: Family Medicine

## 2022-02-25 ENCOUNTER — Ambulatory Visit: Payer: Medicare Other | Admitting: Family Medicine

## 2022-03-12 ENCOUNTER — Telehealth (INDEPENDENT_AMBULATORY_CARE_PROVIDER_SITE_OTHER): Payer: Medicare Other | Admitting: Adult Health

## 2022-03-12 ENCOUNTER — Encounter: Payer: Self-pay | Admitting: Adult Health

## 2022-03-12 DIAGNOSIS — F411 Generalized anxiety disorder: Secondary | ICD-10-CM

## 2022-03-12 DIAGNOSIS — G4700 Insomnia, unspecified: Secondary | ICD-10-CM

## 2022-03-12 DIAGNOSIS — F331 Major depressive disorder, recurrent, moderate: Secondary | ICD-10-CM

## 2022-03-12 MED ORDER — SERTRALINE HCL 100 MG PO TABS: 100.0000 mg | ORAL_TABLET | Freq: Every day | ORAL | 5 refills | Status: DC

## 2022-03-12 MED ORDER — ZOLPIDEM TARTRATE ER 12.5 MG PO TBCR: 12.5000 mg | EXTENDED_RELEASE_TABLET | Freq: Every day | ORAL | 2 refills | Status: DC

## 2022-03-12 MED ORDER — CLORAZEPATE DIPOTASSIUM 7.5 MG PO TABS: ORAL_TABLET | ORAL | 2 refills | Status: DC

## 2022-03-12 NOTE — Progress Notes (Signed)
KHOLTON COATE [id number] July 02, 1951 71 y.o.  Virtual Visit via Video Note  I connected with pt @ on 03/12/22 at 10:00 AM EDT by a video enabled telemedicine application and verified that I am speaking with the correct person using two identifiers.   I discussed the limitations of evaluation and management by telemedicine and the availability of in person appointments. The patient expressed understanding and agreed to proceed.  I discussed the assessment and treatment plan with the patient. The patient was provided an opportunity to ask questions and all were answered. The patient agreed with the plan and demonstrated an understanding of the instructions.   The patient was advised to call back or seek an in-person evaluation if the symptoms worsen or if the condition fails to improve as anticipated.  I provided 20 minutes of non-face-to-face time during this encounter.  The patient was located at home.  The provider was located at [place].   Aloha Gell, NP   Subjective:   Patient ID:  [name] is a 71 y.o. (DOB Jan 17, 1951) male.  Chief Complaint: No chief complaint on file.   HPI [name] presents for follow-up of insomnia, anxiety and depression.   Describes mood today as "ok". Pleasant. Denies tearfulness. Mood symptoms - reports some depression and anxiety - not working or bringing in [place] stressors. Denies irritability. Stating "I'm doing alright". Reports increased situational stressors. Concerned about visual decompensation. Reporting other medical limitations. Financial stressors. Feels like medications are helpful. Stable interest and motivation. Taking medications as prescribed.  Energy levels stable. Active, has a regular exercise routine. Working in the yard. Enjoys some usual interests and activities. Single. Lives with a roommate - 2 cats and a dog. Mostly staying home.  Appetite adequate. Has regained "some" sense of  taste and smell. Weight stable - 205 pounds. Sleeps well most nights. Averages 8 hours. Focus and concentration stable. Completing tasks. Managing aspects of household. Retired. Worked as a Environmental manager. Denies SI or HI.  Denies AH or VH. Denies self harm.  Previous medication trials: Zoloft, Ambien, Tranxene   Review of Systems:  Review of Systems  Musculoskeletal:  Negative for gait problem.  Neurological:  Negative for tremors.  Psychiatric/Behavioral:         Please refer to HPI    Medications: I have reviewed the patient's current medications.  Current Outpatient Medications  Medication Sig Dispense Refill   amoxicillin-clavulanate (AUGMENTIN) 875-125 MG tablet Take 1 tablet by mouth 2 (two) times daily. 20 tablet 0   clorazepate (TRANXENE) 7.5 MG tablet TAKE 1 TABLET(7.5 MG) BY MOUTH DAILY 30 tablet 2   HYDROcodone-acetaminophen (NORCO/VICODIN) 5-325 MG tablet Take 1 tablet by mouth every 6 (six) hours as needed for moderate pain. 15 tablet 0   ibuprofen (ADVIL,MOTRIN) 200 MG tablet Take 100-200 mg by mouth daily as needed for mild pain.     levothyroxine (SYNTHROID) 25 MCG tablet Take 1 tablet (25 mcg total) by mouth daily before breakfast. 90 tablet 1   Multiple Vitamin (MULTIVITAMIN WITH MINERALS) TABS tablet Take 1 tablet by mouth daily.     pantoprazole (PROTONIX) 40 MG tablet TAKE 1 TABLET(40 MG) BY MOUTH EVERY MORNING 30 MINUTES BEFORE BREAKFAST 30 tablet 6   PROLENSA 0.07 % SOLN Place 1 drop into the right eye See admin instructions.     rivaroxaban (XARELTO) 20 MG TABS tablet Take 1 tablet (20 mg total) by mouth daily with supper. NEEDS LABS, COME TO OFFICE 30  tablet 5   sertraline (ZOLOFT) 100 MG tablet Take 1 tablet (100 mg total) by mouth daily. 30 tablet 5   SIMBRINZA 1-0.2 % SUSP Place 1 drop into both eyes 3 (three) times daily.     zolpidem (AMBIEN CR) 12.5 MG CR tablet Take 1 tablet (12.5 mg total) by mouth at bedtime. 30 tablet 2   No current  facility-administered medications for this visit.    Medication Side Effects: None  Allergies:  Allergies  Allergen Reactions   Crestor [Rosuvastatin]     Mood changes   Escitalopram Oxalate     Mood changes, irritablity   Latex Other (See Comments)    Blood in urine with latex catheter- but this was likely from mechanical irritation, not from the latex itself   Sulfonamide Derivatives Rash    Past Medical History:  Diagnosis Date   Alcohol abuse, unspecified    history of   Anxiety    Arthritis    all over   Bladder cancer [facility]) 2011   Dr. Janice Norrie   CAD (coronary artery disease) 2012   MI   Depression    Dyslipidemia    GERD (gastroesophageal reflux disease)    History of DVT (deep vein thrombosis) 11/08 and again in 2014   previously on coumadin   Hypertension    Hypothyroidism    Inguinal hernia without mention of obstruction or gangrene, unilateral or unspecified, (not specified as recurrent)    Internal hemorrhoids without mention of complication    Myocardial infarction [facility])    2011    Other abnormal glucose    Personal history of other disorder of urinary system    Pneumonia    hx of 12/2019    Pulmonary embolism (HCC)    hx of    Stroke ([place]) 05/25/2005   TIA   Thrombocytopenia, unspecified (HCC)    TIA (transient ischemic attack) 2008    Family History  Problem Relation Age of Onset   Other Father        Hypotension from medication causing GI bleed   Heart failure Father    Emphysema Mother    Prostate cancer Neg Hx     Social History   Socioeconomic History   Marital status: Single    Spouse name: Not on file   Number of children: 0   Years of education: Not on file   Highest education level: Not on file  Occupational History   Occupation: Environmental manager  Tobacco Use   Smoking status: Former    Packs/day: 1.50    Years: 25.00    Total pack years: 37.50    Types: Cigarettes    Quit date: 08/26/2004    Years since quitting: 17.5    Smokeless tobacco: Never  Vaping Use   Vaping Use: Never used  Substance and Sexual Activity   Alcohol use: No    Alcohol/week: 0.0 standard drinks of alcohol    Comment: Quit 2009   Drug use: Not Currently    Frequency: 2.0 times per week    Types: Marijuana    Comment: MJ-regular use  10/2013 no longer uses   Sexual activity: Yes  Other Topics Concern   Not on file  Social History Narrative   Environmental manager   Single; 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   Social Determinants of Health   Financial Resource Strain: Low Risk  (12/24/2019)   Overall Financial Resource Strain (CARDIA)    Difficulty of Paying Living Expenses: Not hard at  all  Food Insecurity: No Food Insecurity (12/24/2019)   Hunger Vital Sign    Worried About Running Out of Food in the Last Year: Never true    Ran Out of Food in the Last Year: Never true  Transportation Needs: No Transportation Needs (12/24/2019)   PRAPARE - Hydrologist (Medical): No    Lack of Transportation (Non-Medical): No  Physical Activity: Sufficiently Active (12/24/2019)   Exercise Vital Sign    Days of Exercise per Week: 7 days    Minutes of Exercise per Session: 60 min  Stress: No Stress Concern Present (12/24/2019)   [place]    Feeling of Stress : Not at all  Social Connections: Not on file  Intimate Partner Violence: Not At Risk (12/24/2019)   Humiliation, Afraid, Rape, and Kick questionnaire    Fear of Current or Ex-Partner: No    Emotionally Abused: No    Physically Abused: No    Sexually Abused: No    Past Medical History, Surgical history, Social history, and Family history were reviewed and updated as appropriate.   Please see review of systems for further details on the patient's review from today.   Objective:   Physical Exam:  There were no vitals taken for this visit.  Physical Exam Constitutional:      General: He is not in  acute distress. Musculoskeletal:        General: No deformity.  Neurological:     Mental Status: He is alert and oriented to person, place, and time.     Coordination: Coordination normal.  Psychiatric:        Attention and Perception: Attention and perception normal. He does not perceive auditory or visual hallucinations.        Mood and Affect: Mood normal. Mood is not anxious or depressed. Affect is not labile, blunt, angry or inappropriate.        Speech: Speech normal.        Behavior: Behavior normal.        Thought Content: Thought content normal. Thought content is not paranoid or delusional. Thought content does not include homicidal or suicidal ideation. Thought content does not include homicidal or suicidal plan.        Cognition and Memory: Cognition and memory normal.        Judgment: Judgment normal.     Comments: Insight intact     Lab Review:     Component Value Date/Time   NA 136 11/01/2020 1043   NA 137 02/18/2019 1549   K 4.1 11/01/2020 1043   CL 103 11/01/2020 1043   CO2 23 11/01/2020 1043   GLUCOSE 113 (H) 11/01/2020 1043   BUN 21 11/01/2020 1043   BUN 21 02/18/2019 1549   CREATININE 0.92 11/01/2020 1043   CREATININE 0.88 01/02/2017 0953   CALCIUM 9.3 11/01/2020 1043   PROT 7.3 11/01/2020 1043   ALBUMIN 3.9 11/01/2020 1043   AST 34 11/01/2020 1043   ALT 42 11/01/2020 1043   ALKPHOS 33 (L) 11/01/2020 1043   BILITOT 1.4 (H) 11/01/2020 1043   GFRNONAA >60 11/01/2020 1043   GFRAA >60 02/25/2020 1254       Component Value Date/Time   WBC 9.4 11/01/2020 1043   RBC 5.50 11/01/2020 1043   HGB 18.2 (H) 11/01/2020 1043   HGB 17.4 02/18/2019 1549   HCT 52.8 (H) 11/01/2020 1043   HCT 48.6 02/18/2019 1549   PLT 184  11/01/2020 1043   PLT 170 02/18/2019 1549   MCV 96.0 11/01/2020 1043   MCV 90 02/18/2019 1549   MCH 33.1 11/01/2020 1043   MCHC 34.5 11/01/2020 1043   RDW 13.2 11/01/2020 1043   RDW 12.5 02/18/2019 1549   LYMPHSABS 1.3 01/03/2020 0910    MONOABS 0.5 01/03/2020 0910   EOSABS 0.1 01/03/2020 0910   BASOSABS 0.0 01/03/2020 0910    No results found for: "POCLITH", "LITHIUM"   No results found for: "PHENYTOIN", "PHENOBARB", "VALPROATE", "CBMZ"   .res Assessment: Plan:    Plan:  PDMP reviewed  1. Zoloft '50mg'$  daily  2. Ambien CR 12.'5mg'$  daily - will call in 3 months for next 3 months prescriptions 3. Chlorazepate 7.'5mg'$  daily - will call in 3 months for next 3 months prescriptions  RTC 3 months  Patient advised to contact office with any questions, adverse effects, or acute worsening in signs and symptoms.  Discussed potential benefits, risk, and side effects of benzodiazepines to include potential risk of tolerance and dependence, as well as possible drowsiness.  Advised patient not to drive if experiencing drowsiness and to take lowest possible effective dose to minimize risk of dependence and tolerance.  Time spent with patient was 20 minutes. Greater than 50% of face to face time with patient was spent on counseling and coordination of care.    Diagnoses and all orders for this visit:  Generalized anxiety disorder -     sertraline (ZOLOFT) 100 MG tablet; Take 1 tablet (100 mg total) by mouth daily. -     clorazepate (TRANXENE) 7.5 MG tablet; TAKE 1 TABLET(7.5 MG) BY MOUTH DAILY  Insomnia, unspecified type -     zolpidem (AMBIEN CR) 12.5 MG CR tablet; Take 1 tablet (12.5 mg total) by mouth at bedtime.  Major depressive disorder, recurrent episode, moderate ([place])     Please see After Visit Summary for patient specific instructions.  Future Appointments  Date Time Provider [place]  04/01/2022  9:30 AM Rankin, Clent Demark, MD [facility] None  04/02/2022  8:30 AM Hilty, Nadean Corwin, MD [facility]    No orders of the defined types were placed in this encounter.     -------------------------------

## 2022-04-01 ENCOUNTER — Encounter (INDEPENDENT_AMBULATORY_CARE_PROVIDER_SITE_OTHER): Payer: Medicare Other | Admitting: Ophthalmology

## 2022-04-02 ENCOUNTER — Telehealth: Payer: Self-pay

## 2022-04-02 ENCOUNTER — Telehealth: Payer: Medicare Other | Admitting: Internal Medicine

## 2022-04-02 DIAGNOSIS — Z79899 Other long term (current) drug therapy: Secondary | ICD-10-CM

## 2022-04-02 DIAGNOSIS — E7800 Pure hypercholesterolemia, unspecified: Secondary | ICD-10-CM

## 2022-04-02 DIAGNOSIS — E782 Mixed hyperlipidemia: Secondary | ICD-10-CM

## 2022-04-02 NOTE — Telephone Encounter (Signed)
-----   Message from Deberah Pelton, NP sent at 04/02/2022  9:09 AM EDT ----- Regarding: FW: lipid clinic referral and labs  [facility], please order.  Thank you.JC  ----- Message ----- From: Fidel Levy, RN Sent: 04/02/2022   8:52 AM EDT To: Deberah Pelton, NP; Waylan Rocher, LPN Subject: lipid clinic referral and labs                 [facility] had referred this patient for lipid clinic back in the spring. He was scheduled with Hilty today (8/8) but forgot about his visit. He will be rescheduled. Wanted to see if you can order a fasting lipid panel for him, as his last full lipid panel was >1 year ago and 10/2021 lab was a dLDL  I mentioned this to him which he agreed with.. just can't really order under Dr. Debara Pickett since he isn't his patient yet   Thanks! Eliezer Lofts

## 2022-04-03 ENCOUNTER — Encounter (INDEPENDENT_AMBULATORY_CARE_PROVIDER_SITE_OTHER): Payer: Medicare Other | Admitting: Ophthalmology

## 2022-04-08 ENCOUNTER — Encounter: Payer: Self-pay | Admitting: Internal Medicine

## 2022-04-09 ENCOUNTER — Ambulatory Visit (INDEPENDENT_AMBULATORY_CARE_PROVIDER_SITE_OTHER): Payer: Medicare Other | Admitting: Ophthalmology

## 2022-04-09 ENCOUNTER — Encounter (INDEPENDENT_AMBULATORY_CARE_PROVIDER_SITE_OTHER): Payer: Self-pay | Admitting: Ophthalmology

## 2022-04-09 DIAGNOSIS — H353222 Exudative age-related macular degeneration, left eye, with inactive choroidal neovascularization: Secondary | ICD-10-CM | POA: Diagnosis not present

## 2022-04-09 DIAGNOSIS — H353124 Nonexudative age-related macular degeneration, left eye, advanced atrophic with subfoveal involvement: Secondary | ICD-10-CM | POA: Diagnosis not present

## 2022-04-09 DIAGNOSIS — H353221 Exudative age-related macular degeneration, left eye, with active choroidal neovascularization: Secondary | ICD-10-CM | POA: Diagnosis not present

## 2022-04-09 DIAGNOSIS — H2512 Age-related nuclear cataract, left eye: Secondary | ICD-10-CM | POA: Diagnosis not present

## 2022-04-09 DIAGNOSIS — H353113 Nonexudative age-related macular degeneration, right eye, advanced atrophic without subfoveal involvement: Secondary | ICD-10-CM

## 2022-04-09 NOTE — Assessment & Plan Note (Signed)
Perifoveal atrophy, would be a good candidate for Syfovre in the near futureThe nature of dry age related macular degeneration was discussed with the patient as well as its possible conversion to wet. The results of the AREDS 2 study was discussed with the patient. A diet rich in dark leafy green vegetables was advised and specific recommendations were made regarding supplements with AREDS 2 formulation . Control of hypertension and serum cholesterol may slow the disease. Smoking cessation is mandatory to slow the disease and diminish the risk of progressing to wet age related macular degeneration. The patient was instructed in the use of an [place] and was told to return immediately for any changes in the Grid. Stressed to the patient do not rub eyes  Two forms of dry ARMD exist, one with drusen deposits, yellowish deposits with no obvious atrophy,  ATROPHIC which is the loss of normal blood supply to the choroid (the orange or copper green color layer of the outer retina), which is seen as large areas of atrophy which grow over time to decrease vision.  This atrophic form of dry ARMD is treatable as of Fall 2023.  The treatment consists of injection into the affected eye monthly at first, in attempt to slow the growth of the atrophy. The first approved medication is SYFOVRE (pegcetacoplan).

## 2022-04-09 NOTE — Assessment & Plan Note (Signed)
Moderate NSC currently not accounting for vision but may 1 day need cataract surgery to balance the refraction and the peripheral vision needs in the left eye in the right

## 2022-04-09 NOTE — Assessment & Plan Note (Signed)
No sign of recurrence 

## 2022-04-09 NOTE — Assessment & Plan Note (Signed)
Subfoveal atrophy accounts for acuity

## 2022-04-09 NOTE — Progress Notes (Signed)
04/09/2022     CHIEF COMPLAINT Patient presents for  Chief Complaint  Patient presents with   Macular Degeneration      HISTORY OF PRESENT ILLNESS: [name] is a 71 y.o. male who presents to the clinic today for:   HPI   6 MOS FOR DILATE OU, OCT. Pt stated, "I work as a Industrial/product designer and I don't have the depth perception that I need out of my left eye. When I cover my right eye and look out of my left eye, I cannot see you at all. My left eye has gotten slightly blurry since the last visit. I also see FOL soemtimes out of my right that looks like specks of rainbows. I'm not sure how long that has been going on." Pt is currently taking SIMBRINZA- 1 drop into both eyes 2x daily Prednisolone 5 mins after [facility]- 1 drop into both eyes 2x daily.    Last edited by Silvestre Moment on 04/09/2022  9:39 AM.      Referring physician: Debbra Riding, MD [address] [place],   [address]  HISTORICAL INFORMATION:   Selected notes from the MEDICAL RECORD NUMBER       CURRENT MEDICATIONS: Current Outpatient Medications (Ophthalmic Drugs)  Medication Sig   PROLENSA 0.07 % SOLN Place 1 drop into the right eye See admin instructions.   SIMBRINZA 1-0.2 % SUSP Place 1 drop into both eyes 3 (three) times daily.   No current facility-administered medications for this visit. (Ophthalmic Drugs)   Current Outpatient Medications (Other)  Medication Sig   amoxicillin-clavulanate (AUGMENTIN) 875-125 MG tablet Take 1 tablet by mouth 2 (two) times daily.   clorazepate (TRANXENE) 7.5 MG tablet TAKE 1 TABLET(7.5 MG) BY MOUTH DAILY   HYDROcodone-acetaminophen (NORCO/VICODIN) 5-325 MG tablet Take 1 tablet by mouth every 6 (six) hours as needed for moderate pain.   ibuprofen (ADVIL,MOTRIN) 200 MG tablet Take 100-200 mg by mouth daily as needed for mild pain.   levothyroxine (SYNTHROID) 25 MCG tablet Take 1 tablet (25 mcg total) by mouth daily before breakfast.   Multiple Vitamin  (MULTIVITAMIN WITH MINERALS) TABS tablet Take 1 tablet by mouth daily.   pantoprazole (PROTONIX) 40 MG tablet TAKE 1 TABLET(40 MG) BY MOUTH EVERY MORNING 30 MINUTES BEFORE BREAKFAST   rivaroxaban (XARELTO) 20 MG TABS tablet Take 1 tablet (20 mg total) by mouth daily with supper. NEEDS LABS, COME TO OFFICE   sertraline (ZOLOFT) 100 MG tablet Take 1 tablet (100 mg total) by mouth daily.   zolpidem (AMBIEN CR) 12.5 MG CR tablet Take 1 tablet (12.5 mg total) by mouth at bedtime.   No current facility-administered medications for this visit. (Other)      REVIEW OF SYSTEMS: ROS   Negative for: Constitutional, Gastrointestinal, Neurological, Skin, Genitourinary, Musculoskeletal, HENT, Endocrine, Cardiovascular, Eyes, Respiratory, Psychiatric, Allergic/Imm, Heme/Lymph Last edited by Silvestre Moment on 04/09/2022  9:36 AM.       ALLERGIES Allergies  Allergen Reactions   Crestor [Rosuvastatin]     Mood changes   Escitalopram Oxalate     Mood changes, irritablity   Latex Other (See Comments)    Blood in urine with latex catheter- but this was likely from mechanical irritation, not from the latex itself   Sulfonamide Derivatives Rash    PAST MEDICAL HISTORY Past Medical History:  Diagnosis Date   Alcohol abuse, unspecified    history of   Anxiety    Arthritis    all over  Bladder cancer [facility]) 2011   Dr. Janice Norrie   CAD (coronary artery disease) 2012   MI   Depression    Dyslipidemia    GERD (gastroesophageal reflux disease)    History of DVT (deep vein thrombosis) 11/08 and again in 2014   previously on coumadin   Hypertension    Hypothyroidism    Inguinal hernia without mention of obstruction or gangrene, unilateral or unspecified, (not specified as recurrent)    Internal hemorrhoids without mention of complication    Myocardial infarction [facility])    2011    Other abnormal glucose    Personal history of other disorder of urinary system    Pneumonia    hx of 12/2019    Pulmonary  embolism ([place])    hx of    Stroke ([place]) 05/25/2005   TIA   Thrombocytopenia, unspecified ([place])    TIA (transient ischemic attack) 2008   Past Surgical History:  Procedure Laterality Date   CARDIAC CATHETERIZATION  05/2011   mild atherosclerosis   CYSTOSCOPY  07/12/2011   Procedure: CYSTOSCOPY;  Surgeon: Hanley Ben, MD;  Location: [facility];  Service: Urology;  Laterality: N/A;  one hour being requested for this case   EYE SURGERY Left    injection from wet AMD (age related macular degeneration)   HERNIA REPAIR  8/11   repair of right indirect and direct hernias; incarerated umbilical hernia containing preperitoneal fat   INGUINAL HERNIA REPAIR Left 11/14/2020   Procedure: OPEN LEFT INGUINAL HERNIA REPAIR WITH MESH;  Surgeon: Johnathan Hausen, MD;  Location: [facility];  Service: General;  Laterality: Left;  90 MIN   SHOULDER SURGERY  03/08/08   Left; arthroscopy   TONSILLECTOMY     as child   TRANSURETHRAL RESECTION OF BLADDER TUMOR  07/12/2011   Procedure: TRANSURETHRAL RESECTION OF BLADDER TUMOR (TURBT);  Surgeon: Hanley Ben, MD;  Location: [facility];  Service: Urology;  Laterality: N/A;    FAMILY HISTORY Family History  Problem Relation Age of Onset   Other Father        Hypotension from medication causing GI bleed   Heart failure Father    Emphysema Mother    Prostate cancer Neg Hx     SOCIAL HISTORY Social History   Tobacco Use   Smoking status: Former    Packs/day: 1.50    Years: 25.00    Total pack years: 37.50    Types: Cigarettes    Quit date: 08/26/2004    Years since quitting: 17.6   Smokeless tobacco: Never  Vaping Use   Vaping Use: Never used  Substance Use Topics   Alcohol use: No    Alcohol/week: 0.0 standard drinks of alcohol    Comment: Quit 2009   Drug use: Not Currently    Frequency: 2.0 times per week    Types: Marijuana    Comment: MJ-regular use  10/2013 no longer uses         OPHTHALMIC EXAM:  Base Eye Exam     Visual Acuity (ETDRS)        Right Left   Dist [place] 20/30 20/150   Dist ph [place] NI 20/100 -2         Tonometry (Tonopen, 9:43 AM)       Right Left   Pressure 18 26         Pupils       Pupils APD   Right PERRL None   Left PERRL None  Visual Fields       Left Right     Full   Restrictions Partial outer inferior nasal deficiency          Extraocular Movement       Right Left    Full, Ortho Full, Ortho         Neuro/Psych     Oriented x3: Yes   Mood/Affect: Agitated         Dilation     Both eyes: 1.0% Mydriacyl, 2.5% Phenylephrine @ 9:43 AM           Slit Lamp and Fundus Exam     External Exam       Right Left   External Normal Normal         Slit Lamp Exam       Right Left   Lids/Lashes Normal Normal   Conjunctiva/Sclera White and quiet White and quiet   Cornea Clear Clear   Anterior Chamber Deep and quiet, by Rito Ehrlich Deep and quiet, Rito Ehrlich   Iris Round and reactive Round and reactive   Lens Centered posterior chamber intraocular lens 2+ Nuclear sclerosis   Anterior Vitreous Normal Normal         Fundus Exam       Right Left   Posterior Vitreous Posterior vitreous detachment Posterior vitreous detachment   Disc Normal Normal   C/D Ratio 0.65 0.5   Macula attached, para macular geographic atrophy not in the FAZ, some features of old RPE rip, no active disease Geographic atrophy, Macular thickening, Hard drusen, Disciform scar, inactive   Vessels Normal Normal   Periphery  attached, Normal            IMAGING AND PROCEDURES  Imaging and Procedures for 04/09/22  OCT, Retina - OU - Both Eyes       Right Eye Quality was good. Scan locations included subfoveal. Central Foveal Thickness: 264. Progression has been stable. Findings include no IRF, no SRF, subretinal hyper-reflective material, central retinal atrophy, outer retinal atrophy.   Left Eye Quality was good. Scan locations included subfoveal. Central Foveal Thickness:  227. Progression has been stable. Findings include no IRF, no SRF, abnormal foveal contour, disciform scar, subretinal scarring, central retinal atrophy, outer retinal atrophy.   Notes OD with subretinal hyper reflective material, yet no active intraretinal fluid nor CME. On no rx for 17 months  OS with foveal atrophy secondary to resolution of CNVM with residual scarring from disciform scar and subretinal Hyper reflective material, stable with no recurrences at 60-monthfollow-up, 13 months post rx                ASSESSMENT/PLAN:  Cataract, nuclear sclerotic, left eye Moderate NSC currently not accounting for vision but may 1 day need cataract surgery to balance the refraction and the peripheral vision needs in the left eye in the right  Exudative age-related macular degeneration of left eye with inactive choroidal neovascularization (HCC) No sign of recurrence  Advanced nonexudative age-related macular degeneration of left eye with subfoveal involvement Subfoveal atrophy accounts for acuity  Advanced nonexudative age-related macular degeneration of right eye without subfoveal involvement Perifoveal atrophy, would be a good candidate for Syfovre in the near futureThe nature of dry age related macular degeneration was discussed with the patient as well as its possible conversion to wet. The results of the AREDS 2 study was discussed with the patient. A diet rich in dark leafy green vegetables was advised and specific recommendations were  made regarding supplements with AREDS 2 formulation . Control of hypertension and serum cholesterol may slow the disease. Smoking cessation is mandatory to slow the disease and diminish the risk of progressing to wet age related macular degeneration. The patient was instructed in the use of an [place] and was told to return immediately for any changes in the Grid. Stressed to the patient do not rub eyes  Two forms of dry ARMD exist, one with drusen  deposits, yellowish deposits with no obvious atrophy,  ATROPHIC which is the loss of normal blood supply to the choroid (the orange or copper green color layer of the outer retina), which is seen as large areas of atrophy which grow over time to decrease vision.  This atrophic form of dry ARMD is treatable as of Fall 2023.  The treatment consists of injection into the affected eye monthly at first, in attempt to slow the growth of the atrophy. The first approved medication is SYFOVRE (pegcetacoplan).        ICD-10-CM   1. Exudative age-related macular degeneration of left eye with active choroidal neovascularization (HCC)  H35.3221 OCT, Retina - OU - Both Eyes    2. Cataract, nuclear sclerotic, left eye  H25.12     3. Exudative age-related macular degeneration of left eye with inactive choroidal neovascularization ([place])  H35.3222     4. Advanced nonexudative age-related macular degeneration of left eye with subfoveal involvement  H35.3124     5. Advanced nonexudative age-related macular degeneration of right eye without subfoveal involvement  H35.3113       1.  Change in acuity and no interval change in anatomic findings.  No sign of recurrent CNVM OU  2.  Cataract, left eye follow-up with Dr. Wyatt Portela as scheduled  3.  OD excellent candidate for possible Syfovre given the incipient development of dry atrophic form of AMD.  Ophthalmic Meds Ordered this visit:  No orders of the defined types were placed in this encounter.      Return in about 5 months (around 09/09/2022) for DILATE OU, OCT, COLOR FP discuss possible use Syfovre right eye.  There are no Patient Instructions on file for this visit.   Explained the diagnoses, plan, and follow up with the patient and they expressed understanding.  Patient expressed understanding of the importance of proper follow up care.   [name] M.D. Diseases & Surgery of the Retina and Vitreous [facility] [place] 04/09/22     Abbreviations: M myopia (nearsighted); A astigmatism; H hyperopia (farsighted); P presbyopia; Mrx spectacle prescription;  CTL contact lenses; OD right eye; OS left eye; OU both eyes  XT exotropia; ET esotropia; PEK punctate epithelial keratitis; PEE punctate epithelial erosions; DES dry eye syndrome; MGD meibomian gland dysfunction; ATs artificial tears; PFAT's preservative free artificial tears; [place] nuclear sclerotic cataract; PSC posterior subcapsular cataract; ERM epi-retinal membrane; PVD posterior vitreous detachment; RD retinal detachment; DM diabetes mellitus; DR diabetic retinopathy; NPDR non-proliferative diabetic retinopathy; PDR proliferative diabetic retinopathy; CSME clinically significant macular edema; DME diabetic macular edema; dbh dot blot hemorrhages; CWS cotton wool spot; POAG primary open angle glaucoma; C/D cup-to-disc ratio; HVF humphrey visual field; GVF goldmann visual field; OCT optical coherence tomography; IOP intraocular pressure; BRVO Branch retinal vein occlusion; CRVO central retinal vein occlusion; CRAO central retinal artery occlusion; BRAO branch retinal artery occlusion; RT retinal tear; SB scleral buckle; PPV pars plana vitrectomy; VH Vitreous hemorrhage; PRP panretinal laser photocoagulation; IVK intravitreal kenalog; VMT vitreomacular traction;  MH Macular hole;  NVD neovascularization of the disc; NVE neovascularization elsewhere; AREDS age related eye disease study; ARMD age related macular degeneration; POAG primary open angle glaucoma; EBMD epithelial/anterior basement membrane dystrophy; ACIOL anterior chamber intraocular lens; IOL intraocular lens; PCIOL posterior chamber intraocular lens; Phaco/IOL phacoemulsification with intraocular lens placement; [place] photorefractive keratectomy; LASIK laser assisted in situ keratomileusis; HTN hypertension; DM diabetes mellitus; COPD chronic obstructive pulmonary disease

## 2022-04-16 ENCOUNTER — Ambulatory Visit (INDEPENDENT_AMBULATORY_CARE_PROVIDER_SITE_OTHER): Payer: Medicare Other | Admitting: Nurse Practitioner

## 2022-04-16 VITALS — BP 132/64 | HR 53 | Temp 97.0°F | Resp 12 | Ht 74.0 in | Wt 209.4 lb

## 2022-04-16 DIAGNOSIS — M545 Low back pain, unspecified: Secondary | ICD-10-CM

## 2022-04-16 MED ORDER — TIZANIDINE HCL 2 MG PO TABS: 2.0000 mg | ORAL_TABLET | Freq: Two times a day (BID) | ORAL | 0 refills | Status: DC | PRN

## 2022-04-16 MED ORDER — PREDNISONE 20 MG PO TABS: ORAL_TABLET | ORAL | 0 refills | Status: AC

## 2022-04-16 NOTE — Patient Instructions (Signed)
Nice to see you today If you do not start improving over this week let us know. Try and back off some of the activities you are doing at home The muscle relaxer can make you sleepy or dizzy so use caution Follow up if symptoms fail to improve or get worse

## 2022-04-16 NOTE — Progress Notes (Signed)
Acute Office Visit  Subjective:     Patient ID: [name], male    DOB: 03-08-1951, 71 y.o.   MRN: [id number]  Chief Complaint  Patient presents with   Back Pain    Lower right side back pain x 1 week, doing renovations at home and over did it. Has been using heat and ice, has done stretching exercises. Has taking Advil-this does help but he is not suppose to take it since he is on Xarelto.      Patient is in today for back pain   States that he had a tree fall and crash through the roof of an outside shed. States that he also had log siding on the porch that he had to carry to his tool shed as of late  States that it started 1 week ago. States it is in the low back to center and some to the right side.. Hurts all the time as a dull ache and movement makes it worse. States that he has tried lidocaine patches did not help much.      Review of Systems  Constitutional:  Negative for chills and fever.  Genitourinary:        Negative bowel and bladder involvement   Musculoskeletal:  Positive for back pain.  Neurological:  Negative for tingling and weakness.        Objective:    BP 132/64   Pulse (!) 53   Temp (!) 97 F (36.1 C)   Resp 12   Ht '6\' 2"'$  (1.88 m)   Wt 209 lb 6 oz (95 kg)   SpO2 95%   BMI 26.88 kg/m    Physical Exam Vitals and nursing note reviewed.  Constitutional:      Appearance: Normal appearance.  Cardiovascular:     Rate and Rhythm: Normal rate and regular rhythm.     Heart sounds: Normal heart sounds.  Pulmonary:     Effort: Pulmonary effort is normal.     Breath sounds: Normal breath sounds.  Musculoskeletal:     Lumbar back: Tenderness present. No bony tenderness. Positive right straight leg raise test. Negative left straight leg raise test.       Back:  Skin:    General: Skin is warm.  Neurological:     General: No focal deficit present.     Mental Status: He is alert.     Deep Tendon Reflexes:     Reflex Scores:       Patellar reflexes are 2+ on the right side and 2+ on the left side.    Comments: Bilateral lower extremity strength 5/5     No results found for any visits on 04/16/22.      Assessment & Plan:   Problem List Items Addressed This Visit       Other   Lumbar back pain - Primary    For imaging for now.  No red flags during physical exam.  We will treat patient with small prednisone taper along with tizanidine muscle relaxer.  Patient was advised to avoid NSAIDs with his blood thinner.  Follow-up if no improvement.  Asked patient to reduce activity over the next couple days.  If no improvement can consider imaging at that point      Relevant Medications   predniSONE (DELTASONE) 20 MG tablet   tiZANidine (ZANAFLEX) 2 MG tablet    Meds ordered this encounter  Medications   predniSONE (DELTASONE) 20 MG tablet    Sig: Take  1 tablet (20 mg total) by mouth 2 (two) times daily with a meal for 3 days, THEN 1 tablet (20 mg total) daily with breakfast for 3 days. Do not take NSAIDs like: aleve, ibuprofen, naproxen, motrin, BC/Goody powders.    Dispense:  9 tablet    Refill:  0    Order Specific Question:   Supervising Provider    Answer:   Glori Bickers, MARNE A [[id number]]   tiZANidine (ZANAFLEX) 2 MG tablet    Sig: Take 1 tablet (2 mg total) by mouth 2 (two) times daily as needed for muscle spasms.    Dispense:  15 tablet    Refill:  0    Order Specific Question:   Supervising Provider    Answer:   TOWER, MARNE A [[id number]]    Return if symptoms worsen or fail to improve.  Romilda Garret, NP

## 2022-04-16 NOTE — Assessment & Plan Note (Signed)
For imaging for now.  No red flags during physical exam.  We will treat patient with small prednisone taper along with tizanidine muscle relaxer.  Patient was advised to avoid NSAIDs with his blood thinner.  Follow-up if no improvement.  Asked patient to reduce activity over the next couple days.  If no improvement can consider imaging at that point

## 2022-05-08 ENCOUNTER — Encounter (INDEPENDENT_AMBULATORY_CARE_PROVIDER_SITE_OTHER): Payer: Medicare Other | Admitting: Ophthalmology

## 2022-06-12 ENCOUNTER — Other Ambulatory Visit: Payer: Self-pay | Admitting: Adult Health

## 2022-06-12 DIAGNOSIS — G4700 Insomnia, unspecified: Secondary | ICD-10-CM

## 2022-06-12 NOTE — Telephone Encounter (Signed)
Please call to schedule an appt  

## 2022-06-14 ENCOUNTER — Other Ambulatory Visit: Payer: Self-pay

## 2022-06-14 DIAGNOSIS — F411 Generalized anxiety disorder: Secondary | ICD-10-CM

## 2022-06-14 MED ORDER — CLORAZEPATE DIPOTASSIUM 7.5 MG PO TABS: ORAL_TABLET | ORAL | 2 refills | Status: DC

## 2022-06-14 NOTE — Telephone Encounter (Signed)
Pt requesting Rx for Clorazepate 7.5 mg and Zolpidem at [facility]. Apt  09/12/22

## 2022-06-25 DIAGNOSIS — Z08 Encounter for follow-up examination after completed treatment for malignant neoplasm: Secondary | ICD-10-CM | POA: Diagnosis not present

## 2022-06-25 DIAGNOSIS — D2262 Melanocytic nevi of left upper limb, including shoulder: Secondary | ICD-10-CM | POA: Diagnosis not present

## 2022-06-25 DIAGNOSIS — D2261 Melanocytic nevi of right upper limb, including shoulder: Secondary | ICD-10-CM | POA: Diagnosis not present

## 2022-06-25 DIAGNOSIS — L578 Other skin changes due to chronic exposure to nonionizing radiation: Secondary | ICD-10-CM | POA: Diagnosis not present

## 2022-08-01 DIAGNOSIS — H2512 Age-related nuclear cataract, left eye: Secondary | ICD-10-CM | POA: Diagnosis not present

## 2022-08-01 DIAGNOSIS — H35372 Puckering of macula, left eye: Secondary | ICD-10-CM | POA: Diagnosis not present

## 2022-08-01 DIAGNOSIS — H353112 Nonexudative age-related macular degeneration, right eye, intermediate dry stage: Secondary | ICD-10-CM | POA: Diagnosis not present

## 2022-08-01 DIAGNOSIS — H353221 Exudative age-related macular degeneration, left eye, with active choroidal neovascularization: Secondary | ICD-10-CM | POA: Diagnosis not present

## 2022-08-09 ENCOUNTER — Other Ambulatory Visit: Payer: Self-pay | Admitting: Physician Assistant

## 2022-09-04 ENCOUNTER — Encounter (INDEPENDENT_AMBULATORY_CARE_PROVIDER_SITE_OTHER): Payer: Medicare Other | Admitting: Ophthalmology

## 2022-09-09 ENCOUNTER — Encounter (INDEPENDENT_AMBULATORY_CARE_PROVIDER_SITE_OTHER): Payer: Medicare Other | Admitting: Ophthalmology

## 2022-09-12 ENCOUNTER — Encounter: Payer: Self-pay | Admitting: Adult Health

## 2022-09-12 ENCOUNTER — Ambulatory Visit (INDEPENDENT_AMBULATORY_CARE_PROVIDER_SITE_OTHER): Payer: Medicare Other | Admitting: Adult Health

## 2022-09-12 ENCOUNTER — Ambulatory Visit (INDEPENDENT_AMBULATORY_CARE_PROVIDER_SITE_OTHER): Payer: Self-pay | Admitting: Adult Health

## 2022-09-12 DIAGNOSIS — G4700 Insomnia, unspecified: Secondary | ICD-10-CM

## 2022-09-12 DIAGNOSIS — F489 Nonpsychotic mental disorder, unspecified: Secondary | ICD-10-CM

## 2022-09-12 DIAGNOSIS — F411 Generalized anxiety disorder: Secondary | ICD-10-CM

## 2022-09-12 DIAGNOSIS — F331 Major depressive disorder, recurrent, moderate: Secondary | ICD-10-CM | POA: Diagnosis not present

## 2022-09-12 MED ORDER — SERTRALINE HCL 50 MG PO TABS: 50.0000 mg | ORAL_TABLET | Freq: Every day | ORAL | 5 refills | Status: DC

## 2022-09-12 MED ORDER — ZOLPIDEM TARTRATE ER 12.5 MG PO TBCR: EXTENDED_RELEASE_TABLET | ORAL | 2 refills | Status: DC

## 2022-09-12 MED ORDER — CLORAZEPATE DIPOTASSIUM 7.5 MG PO TABS: ORAL_TABLET | ORAL | 2 refills | Status: DC

## 2022-09-12 NOTE — Progress Notes (Signed)
Patient no show appointment. ? ?

## 2022-09-12 NOTE — Progress Notes (Signed)
[name] [id number] 1950-10-23 72 y.o.  Virtual Visit via Telephone Note  I connected with pt on 09/12/22 at  4:20 PM EST by telephone and verified that I am speaking with the correct person using two identifiers.   I discussed the limitations, risks, security and privacy concerns of performing an evaluation and management service by telephone and the availability of in person appointments. I also discussed with the patient that there may be a patient responsible charge related to this service. The patient expressed understanding and agreed to proceed.   I discussed the assessment and treatment plan with the patient. The patient was provided an opportunity to ask questions and all were answered. The patient agreed with the plan and demonstrated an understanding of the instructions.   The patient was advised to call back or seek an in-person evaluation if the symptoms worsen or if the condition fails to improve as anticipated.  I provided 15 minutes of non-face-to-face time during this encounter.  The patient was located at home.  The provider was located at [place].   Aloha Gell, NP   Subjective:   Patient ID:  [name] is a 72 y.o. (DOB 1950/10/25) male.  Chief Complaint: No chief complaint on file.   HPI [name] presents for follow-up of insomnia, anxiety and depression.   Describes mood today as "ok". Pleasant. Denies tearfulness. Mood symptoms - reports some depression,  anxiety, and irritability. Mood is consistent. Stating "I'm doing alright". Reports situational stressors. Concerned about visual decompensation - medical limitations. Reports financial stressors. Feels like medications are helpful. Stable interest and motivation. Taking medications as prescribed.  Energy levels stable. Active, does not have a regular exercise routine.   Enjoys some usual interests and activities. Single. Lives with a roommate - 2 cats and a dog. Mostly  staying home.  Appetite adequate. Weight loss - 200 pounds. Sleeps well most nights. Averages 8 hours. Focus and concentration stable. Completing tasks. Managing aspects of household. Retired. Works as a Environmental manager. Denies SI or HI.  Denies AH or VH. Denies self harm.  Previous medication trials: Zoloft, Ambien, Tranxene   Review of Systems:  Review of Systems  Musculoskeletal:  Negative for gait problem.  Neurological:  Negative for tremors.  Psychiatric/Behavioral:         Please refer to HPI    Medications: I have reviewed the patient's current medications.  Current Outpatient Medications  Medication Sig Dispense Refill   clorazepate (TRANXENE) 7.5 MG tablet TAKE 1 TABLET(7.5 MG) BY MOUTH DAILY 30 tablet 2   ibuprofen (ADVIL,MOTRIN) 200 MG tablet Take 100-200 mg by mouth daily as needed for mild pain.     levothyroxine (SYNTHROID) 25 MCG tablet Take 1 tablet (25 mcg total) by mouth daily before breakfast. 90 tablet 1   Multiple Vitamin (MULTIVITAMIN WITH MINERALS) TABS tablet Take 1 tablet by mouth daily.     pantoprazole (PROTONIX) 40 MG tablet TAKE 1 TABLET(40 MG) BY MOUTH EVERY MORNING 30 MINUTES BEFORE BREAKFAST 30 tablet 6   PROLENSA 0.07 % SOLN Place 1 drop into the right eye See admin instructions.     rivaroxaban (XARELTO) 20 MG TABS tablet Take 1 tablet (20 mg total) by mouth daily with supper. NEEDS LABS, COME TO OFFICE 30 tablet 5   sertraline (ZOLOFT) 50 MG tablet Take 1 tablet (50 mg total) by mouth daily. 30 tablet 5   SIMBRINZA 1-0.2 % SUSP Place 1 drop into both eyes 3 (three) times  daily.     tiZANidine (ZANAFLEX) 2 MG tablet Take 1 tablet (2 mg total) by mouth 2 (two) times daily as needed for muscle spasms. 15 tablet 0   zolpidem (AMBIEN CR) 12.5 MG CR tablet TAKE 1 TABLET(12.5 MG) BY MOUTH AT BEDTIME 30 tablet 2   No current facility-administered medications for this visit.    Medication Side Effects: None  Allergies:  Allergies  Allergen Reactions    Crestor [Rosuvastatin]     Mood changes   Escitalopram Oxalate     Mood changes, irritablity   Latex Other (See Comments)    Blood in urine with latex catheter- but this was likely from mechanical irritation, not from the latex itself   Sulfonamide Derivatives Rash    Past Medical History:  Diagnosis Date   Alcohol abuse, unspecified    history of   Anxiety    Arthritis    all over   Bladder cancer [facility]) 2011   Dr. Janice Norrie   CAD (coronary artery disease) 2012   MI   Depression    Dyslipidemia    GERD (gastroesophageal reflux disease)    History of DVT (deep vein thrombosis) 11/08 and again in 2014   previously on coumadin   Hypertension    Hypothyroidism    Inguinal hernia without mention of obstruction or gangrene, unilateral or unspecified, (not specified as recurrent)    Internal hemorrhoids without mention of complication    Myocardial infarction [facility])    2011    Other abnormal glucose    Personal history of other disorder of urinary system    Pneumonia    hx of 12/2019    Pulmonary embolism (HCC)    hx of    Stroke ([place]) 05/25/2005   TIA   Thrombocytopenia, unspecified (HCC)    TIA (transient ischemic attack) 2008    Family History  Problem Relation Age of Onset   Other Father        Hypotension from medication causing GI bleed   Heart failure Father    Emphysema Mother    Prostate cancer Neg Hx     Social History   Socioeconomic History   Marital status: Single    Spouse name: Not on file   Number of children: 0   Years of education: Not on file   Highest education level: Not on file  Occupational History   Occupation: Environmental manager  Tobacco Use   Smoking status: Former    Packs/day: 1.50    Years: 25.00    Total pack years: 37.50    Types: Cigarettes    Quit date: 08/26/2004    Years since quitting: 18.0   Smokeless tobacco: Never  Vaping Use   Vaping Use: Never used  Substance and Sexual Activity   Alcohol use: No    Alcohol/week: 0.0  standard drinks of alcohol    Comment: Quit 2009   Drug use: Not Currently    Frequency: 2.0 times per week    Types: Marijuana    Comment: MJ-regular use  10/2013 no longer uses   Sexual activity: Yes  Other Topics Concern   Not on file  Social History Narrative   Environmental manager   Single; 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   Social Determinants of Health   Financial Resource Strain: Low Risk  (12/24/2019)   Overall Financial Resource Strain (CARDIA)    Difficulty of Paying Living Expenses: Not hard at all  Food Insecurity: No Food Insecurity (12/24/2019)   Hunger  Vital Sign    Worried About Charity fundraiser in the Last Year: Never true    Ran Out of Food in the Last Year: Never true  Transportation Needs: No Transportation Needs (12/24/2019)   PRAPARE - Hydrologist (Medical): No    Lack of Transportation (Non-Medical): No  Physical Activity: Sufficiently Active (12/24/2019)   Exercise Vital Sign    Days of Exercise per Week: 7 days    Minutes of Exercise per Session: 60 min  Stress: No Stress Concern Present (12/24/2019)   [place]    Feeling of Stress : Not at all  Social Connections: Not on file  Intimate Partner Violence: Not At Risk (12/24/2019)   Humiliation, Afraid, Rape, and Kick questionnaire    Fear of Current or Ex-Partner: No    Emotionally Abused: No    Physically Abused: No    Sexually Abused: No    Past Medical History, Surgical history, Social history, and Family history were reviewed and updated as appropriate.   Please see review of systems for further details on the patient's review from today.   Objective:   Physical Exam:  There were no vitals taken for this visit.  Physical Exam Constitutional:      General: He is not in acute distress. Musculoskeletal:        General: No deformity.  Neurological:     Mental Status: He is alert and oriented to person,  place, and time.     Coordination: Coordination normal.  Psychiatric:        Attention and Perception: Attention and perception normal. He does not perceive auditory or visual hallucinations.        Mood and Affect: Mood normal. Mood is not anxious or depressed. Affect is not labile, blunt, angry or inappropriate.        Speech: Speech normal.        Behavior: Behavior normal.        Thought Content: Thought content normal. Thought content is not paranoid or delusional. Thought content does not include homicidal or suicidal ideation. Thought content does not include homicidal or suicidal plan.        Cognition and Memory: Cognition and memory normal.        Judgment: Judgment normal.     Comments: Insight intact     Lab Review:     Component Value Date/Time   NA 136 11/01/2020 1043   NA 137 02/18/2019 1549   K 4.1 11/01/2020 1043   CL 103 11/01/2020 1043   CO2 23 11/01/2020 1043   GLUCOSE 113 (H) 11/01/2020 1043   BUN 21 11/01/2020 1043   BUN 21 02/18/2019 1549   CREATININE 0.92 11/01/2020 1043   CREATININE 0.88 01/02/2017 0953   CALCIUM 9.3 11/01/2020 1043   PROT 7.3 11/01/2020 1043   ALBUMIN 3.9 11/01/2020 1043   AST 34 11/01/2020 1043   ALT 42 11/01/2020 1043   ALKPHOS 33 (L) 11/01/2020 1043   BILITOT 1.4 (H) 11/01/2020 1043   GFRNONAA >60 11/01/2020 1043   GFRAA >60 02/25/2020 1254       Component Value Date/Time   WBC 9.4 11/01/2020 1043   RBC 5.50 11/01/2020 1043   HGB 18.2 (H) 11/01/2020 1043   HGB 17.4 02/18/2019 1549   HCT 52.8 (H) 11/01/2020 1043   HCT 48.6 02/18/2019 1549   PLT 184 11/01/2020 1043   PLT 170 02/18/2019 1549   MCV  96.0 11/01/2020 1043   MCV 90 02/18/2019 1549   MCH 33.1 11/01/2020 1043   MCHC 34.5 11/01/2020 1043   RDW 13.2 11/01/2020 1043   RDW 12.5 02/18/2019 1549   LYMPHSABS 1.3 01/03/2020 0910   MONOABS 0.5 01/03/2020 0910   EOSABS 0.1 01/03/2020 0910   BASOSABS 0.0 01/03/2020 0910    No results found for: "POCLITH", "LITHIUM"    No results found for: "PHENYTOIN", "PHENOBARB", "VALPROATE", "CBMZ"   .res Assessment: Plan:    Plan:  PDMP reviewed  1. Zoloft '50mg'$  daily  2. Ambien CR 12.'5mg'$  daily - will call in 3 months for next 3 months prescriptions 3. Chlorazepate 7.'5mg'$  daily - will call in 3 months for next 3 months prescriptions  RTC 3 months  Patient advised to contact office with any questions, adverse effects, or acute worsening in signs and symptoms.  Discussed potential benefits, risk, and side effects of benzodiazepines to include potential risk of tolerance and dependence, as well as possible drowsiness.  Advised patient not to drive if experiencing drowsiness and to take lowest possible effective dose to minimize risk of dependence and tolerance.  Time spent with patient was 20 minutes. Greater than 50% of face to face time with patient was spent on counseling and coordination of care.     Diagnoses and all orders for this visit:  Major depressive disorder, recurrent episode, moderate (HCC)  Generalized anxiety disorder -     sertraline (ZOLOFT) 50 MG tablet; Take 1 tablet (50 mg total) by mouth daily. -     clorazepate (TRANXENE) 7.5 MG tablet; TAKE 1 TABLET(7.5 MG) BY MOUTH DAILY  Insomnia, unspecified type -     zolpidem (AMBIEN CR) 12.5 MG CR tablet; TAKE 1 TABLET(12.5 MG) BY MOUTH AT BEDTIME    Please see After Visit Summary for patient specific instructions.  No future appointments.  No orders of the defined types were placed in this encounter.     -------------------------------

## 2022-09-16 ENCOUNTER — Telehealth: Payer: Self-pay

## 2022-09-16 ENCOUNTER — Ambulatory Visit (INDEPENDENT_AMBULATORY_CARE_PROVIDER_SITE_OTHER): Payer: Medicare Other | Admitting: Internal Medicine

## 2022-09-16 ENCOUNTER — Encounter: Payer: Self-pay | Admitting: Internal Medicine

## 2022-09-16 VITALS — BP 110/80 | HR 75 | Temp 97.2°F | Ht 74.0 in | Wt 201.0 lb

## 2022-09-16 DIAGNOSIS — B029 Zoster without complications: Secondary | ICD-10-CM | POA: Diagnosis not present

## 2022-09-16 DIAGNOSIS — H353222 Exudative age-related macular degeneration, left eye, with inactive choroidal neovascularization: Secondary | ICD-10-CM

## 2022-09-16 DIAGNOSIS — F39 Unspecified mood [affective] disorder: Secondary | ICD-10-CM

## 2022-09-16 MED ORDER — VALACYCLOVIR HCL 1 G PO TABS: 1000.0000 mg | ORAL_TABLET | Freq: Three times a day (TID) | ORAL | 0 refills | Status: DC

## 2022-09-16 NOTE — Telephone Encounter (Signed)
I spoke with pt; pt said he now has a red rash at waistline on lt side; pt said he is not having problems breathing or CP this morning and pt has to work this morning and scheduled appt with Dr Silvio Pate 09/16/22 at 2 PM. Pt does not see blisters at rash area but pt looked up rash and thinks he has shingles. UC & ED precautions given and pt voiced understanding. Sending note to Dr Silvio Pate and Silvio Pate pool.

## 2022-09-16 NOTE — Telephone Encounter (Signed)
Okay I will see him at the appointment

## 2022-09-16 NOTE — Telephone Encounter (Signed)
[facility] Night - Client TELEPHONE ADVICE RECORD [facility] Patient Name: [name] Gender: Male DOB: [birth year] Age: 72 Y 61 M 5 D Return Phone Number: [phone] (Primary), [phone] (Secondary) Address[place]  [address] Client [facility] Night - Client Client Site [name] Provider Renford Dills - MD Contact Type Call Who Is Calling Patient / Member / Family / Caregiver Call Type Triage / Clinical Relationship To Patient Self Return Phone Number [phone] (Primary) Chief Complaint BREATHING - shortness of breath or sounds breathless Reason for Call Symptomatic / Request for [place] states he is experiencing pain in his lower left lung area - both front and back areas. He has been sick for over a week but is unsure if the two are related. Additional Comment He is also short of breath. Translation No Nurse Assessment Nurse: Wynetta Emery, RN, [place] Date/Time (Eastern Time): 09/13/2022 5:08:04 PM Confirm and document reason for call. If symptomatic, describe symptoms. ---Caller states has had headache, congestion, cough, and other flu like s/s for about 7-8 days. Having pain in left side/upper abdomen toward his back. Pain unchanged with deep breath. Pain is intermittent, lasting 30 seconds to several minutes. Does the patient have any new or worsening symptoms? ---Yes Will a triage be completed? ---Yes Related visit to physician within the last 2 weeks? ---No Does the PT have any chronic conditions? (i.e. diabetes, asthma, this includes High risk factors for pregnancy, etc.) ---Yes List chronic conditions. ---DVT, PE on Xarelto. TIA, Is this a behavioral health or substance abuse call? ---No Guidelines Guideline Title Affirmed Question Affirmed Notes Nurse Date/Time Eilene Ghazi Time) Chest Pain [1] Chest pain lasts > 5  minutes AND [2] history of heart disease (i.e., angina, heart Wynetta Emery, RN, [place] 09/13/2022 5:14:38 PM PLEASE NOTE: All timestamps contained within this report are represented as [place] Standard Time. CONFIDENTIALTY NOTICE: This fax transmission is intended only for the addressee. It contains information that is legally privileged, confidential or otherwise protected from use or disclosure. If you are not the intended recipient, you are strictly prohibited from reviewing, disclosing, copying using or disseminating any of this information or taking any action in reliance on or regarding this information. If you have received this fax in error, please notify us immediately by telephone so that we can arrange for its return to [place]. Phone: [phone], Toll-Free: [phone], Fax: [phone] Page: 2 of 2 Call Id: [id number] Guidelines Guideline Title Affirmed Question Affirmed Notes Nurse Date/Time Eilene Ghazi Time) attack, heart failure, bypass surgery, takes nitroglycerin) Disp. Time Eilene Ghazi Time) Disposition Final User 09/13/2022 5:06:02 PM Send to Urgent Minda Meo 09/13/2022 5:17:59 PM Call EMS 911 Now Yes Wynetta Emery, RN, [place] 09/13/2022 5:20:10 PM 911 Outcome Documentation Wynetta Emery, RN, Manuella Ghazi Reason: Refused Final Disposition 09/13/2022 5:17:59 PM Call EMS 911 Now Yes Wynetta Emery, RN, [place] Disagree/Comply Disagree Caller Understands Yes PreDisposition InappropriateToAsk Care Advice Given Per Guideline CALL EMS 911 NOW: * Immediate medical attention is needed. You need to hang up and call 911 (or an ambulance). CARE ADVICE given per Chest Pain (Adult) guideline. Referrals GO TO FACILITY REFUSE

## 2022-09-16 NOTE — Progress Notes (Signed)
Subjective:    Patient ID: [name], male    DOB: [birth year], 72 y.o.   MRN: [id number]  HPI Here due to flank pain and now a rash  Had the flu 3-4 weeks ago---seems to be over it Then about 10 days ago--felt a knot in his left side Tried stretching and exercises Now has "lesions" along the left flank---his roommate noted them because his vision is poor Significant "grinding" type pain "It would grab me, then let go----but now continuous"  No Rx  Current Outpatient Medications on File Prior to Visit  Medication Sig Dispense Refill   clorazepate (TRANXENE) 7.5 MG tablet TAKE 1 TABLET(7.5 MG) BY MOUTH DAILY 30 tablet 2   ibuprofen (ADVIL,MOTRIN) 200 MG tablet Take 100-200 mg by mouth daily as needed for mild pain.     levothyroxine (SYNTHROID) 25 MCG tablet Take 1 tablet (25 mcg total) by mouth daily before breakfast. 90 tablet 1   Multiple Vitamin (MULTIVITAMIN WITH MINERALS) TABS tablet Take 1 tablet by mouth daily.     pantoprazole (PROTONIX) 40 MG tablet TAKE 1 TABLET(40 MG) BY MOUTH EVERY MORNING 30 MINUTES BEFORE BREAKFAST 30 tablet 6   rivaroxaban (XARELTO) 20 MG TABS tablet Take 1 tablet (20 mg total) by mouth daily with supper. NEEDS LABS, COME TO OFFICE 30 tablet 5   sertraline (ZOLOFT) 50 MG tablet Take 1 tablet (50 mg total) by mouth daily. 30 tablet 5   SIMBRINZA 1-0.2 % SUSP Place 1 drop into both eyes 3 (three) times daily.     zolpidem (AMBIEN CR) 12.5 MG CR tablet TAKE 1 TABLET(12.5 MG) BY MOUTH AT BEDTIME 30 tablet 2   PROLENSA 0.07 % SOLN Place 1 drop into the right eye See admin instructions. (Patient not taking: Reported on 09/16/2022)     No current facility-administered medications on file prior to visit.    Allergies  Allergen Reactions   Crestor [Rosuvastatin]     Mood changes   Escitalopram Oxalate     Mood changes, irritablity   Latex Other (See Comments)    Blood in urine with latex catheter- but this was likely from mechanical irritation, not  from the latex itself   Sulfonamide Derivatives Rash    Past Medical History:  Diagnosis Date   Alcohol abuse, unspecified    history of   Anxiety    Arthritis    all over   Bladder cancer [facility]) 2011   Dr. Janice Norrie   CAD (coronary artery disease) 2012   MI   Depression    Dyslipidemia    GERD (gastroesophageal reflux disease)    History of DVT (deep vein thrombosis) 11/08 and again in 2014   previously on coumadin   Hypertension    Hypothyroidism    Inguinal hernia without mention of obstruction or gangrene, unilateral or unspecified, (not specified as recurrent)    Internal hemorrhoids without mention of complication    Myocardial infarction [facility])    2011    Other abnormal glucose    Personal history of other disorder of urinary system    Pneumonia    hx of 12/2019    Pulmonary embolism ([place])    hx of    Stroke ([place]) 05/25/2005   TIA   Thrombocytopenia, unspecified ([place])    TIA (transient ischemic attack) 2008    Past Surgical History:  Procedure Laterality Date   CARDIAC CATHETERIZATION  05/2011   mild atherosclerosis   CYSTOSCOPY  07/12/2011   Procedure: CYSTOSCOPY;  Surgeon:  Hanley Ben, MD;  Location: [facility];  Service: Urology;  Laterality: N/A;  one hour being requested for this case   EYE SURGERY Left    injection from wet AMD (age related macular degeneration)   HERNIA REPAIR  8/11   repair of right indirect and direct hernias; incarerated umbilical hernia containing preperitoneal fat   INGUINAL HERNIA REPAIR Left 11/14/2020   Procedure: OPEN LEFT INGUINAL HERNIA REPAIR WITH MESH;  Surgeon: Johnathan Hausen, MD;  Location: [facility];  Service: General;  Laterality: Left;  90 MIN   SHOULDER SURGERY  03/08/08   Left; arthroscopy   TONSILLECTOMY     as child   TRANSURETHRAL RESECTION OF BLADDER TUMOR  07/12/2011   Procedure: TRANSURETHRAL RESECTION OF BLADDER TUMOR (TURBT);  Surgeon: Hanley Ben, MD;  Location: [facility];  Service: Urology;  Laterality: N/A;     Family History  Problem Relation Age of Onset   Other Father        Hypotension from medication causing GI bleed   Heart failure Father    Emphysema Mother    Prostate cancer Neg Hx     Social History   Socioeconomic History   Marital status: Single    Spouse name: Not on file   Number of children: 0   Years of education: Not on file   Highest education level: Not on file  Occupational History   Occupation: Environmental manager  Tobacco Use   Smoking status: Former    Packs/day: 1.50    Years: 25.00    Total pack years: 37.50    Types: Cigarettes    Quit date: 08/26/2004    Years since quitting: 18.0   Smokeless tobacco: Never  Vaping Use   Vaping Use: Never used  Substance and Sexual Activity   Alcohol use: No    Alcohol/week: 0.0 standard drinks of alcohol    Comment: Quit 2009   Drug use: Not Currently    Frequency: 2.0 times per week    Types: Marijuana    Comment: MJ-regular use  10/2013 no longer uses   Sexual activity: Yes  Other Topics Concern   Not on file  Social History Narrative   Environmental manager   Single; 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   Social Determinants of Health   Financial Resource Strain: Low Risk  (12/24/2019)   Overall Financial Resource Strain (CARDIA)    Difficulty of Paying Living Expenses: Not hard at all  Food Insecurity: No Food Insecurity (12/24/2019)   Hunger Vital Sign    Worried About Running Out of Food in the Last Year: Never true    [place] in the Last Year: Never true  Transportation Needs: No Transportation Needs (12/24/2019)   PRAPARE - Hydrologist (Medical): No    Lack of Transportation (Non-Medical): No  Physical Activity: Sufficiently Active (12/24/2019)   Exercise Vital Sign    Days of Exercise per Week: 7 days    Minutes of Exercise per Session: 60 min  Stress: No Stress Concern Present (12/24/2019)   [place]     Feeling of Stress : Not at all  Social Connections: Not on file  Intimate Partner Violence: Not At Risk (12/24/2019)   Humiliation, Afraid, Rape, and Kick questionnaire    Fear of Current or Ex-Partner: No    Emotionally Abused: No    Physically Abused: No    Sexually Abused: No   Review  of Systems No clear fever Did have achiness and a sweat--about 10 days ago     Objective:   Physical Exam Constitutional:      Appearance: Normal appearance.  Skin:    Comments: Scattered red papules along left L1 dermatome Not crusted but not new  Neurological:     Mental Status: He is alert.            Assessment & Plan:

## 2022-09-16 NOTE — Assessment & Plan Note (Signed)
Fairly classic appearance--but seems to be near the end Still having pain and not crusted so will go ahead with Rx with valacyclovir '1000mg'$  tid for a week

## 2022-09-16 NOTE — Assessment & Plan Note (Signed)
Ongoing vision loss but still getting injections

## 2022-09-16 NOTE — Assessment & Plan Note (Addendum)
He voices multiple problems in his life Can't play piano, vision is fading, poor outcomes with hand surgery and Peyronnie's Limited in all that he does Is on the sertraline '50mg'$  daily Will make referral for psychologist

## 2022-09-24 ENCOUNTER — Ambulatory Visit: Payer: BLUE CROSS/BLUE SHIELD | Admitting: Family Medicine

## 2022-10-08 ENCOUNTER — Ambulatory Visit (INDEPENDENT_AMBULATORY_CARE_PROVIDER_SITE_OTHER): Payer: Medicare Other

## 2022-10-08 VITALS — Ht 74.0 in | Wt 205.0 lb

## 2022-10-08 DIAGNOSIS — Z0000 Encounter for general adult medical examination without abnormal findings: Secondary | ICD-10-CM | POA: Diagnosis not present

## 2022-10-08 NOTE — Patient Instructions (Signed)
[name] , Thank you for taking time to come for your Medicare Wellness Visit. I appreciate your ongoing commitment to your health goals. Please review the following plan we discussed and let me know if I can assist you in the future.   These are the goals we discussed:  Goals      Patient Stated     12/24/2019, I will continue to lift weights everyday for about 1 hour     Patient Stated     To clean the storage house out torn down.        This is a list of the screening recommended for you and due dates:  Health Maintenance  Topic Date Due   Hepatitis C Screening: USPSTF Recommendation to screen - Ages 26-79 yo.  Never done   Zoster (Shingles) Vaccine (1 of 2) Never done   DTaP/Tdap/Td vaccine (2 - Tdap) 08/29/2018   Pneumonia Vaccine (2 of 2 - PCV) 05/11/2020   Flu Shot  03/26/2022   COVID-19 Vaccine (3 - 2023-24 season) 04/26/2022   Medicare Annual Wellness Visit  10/09/2023   Colon Cancer Screening  10/10/2030   HPV Vaccine  Aged Out    Advanced directives: Advance directive discussed with you today. Even though you declined this today, please call our office should you change your mind, and we can give you the proper paperwork for you to fill out.   Conditions/risks identified: none  Next appointment: Follow up in one year for your annual wellness visit. 10/13/2023 @ 9:00 via telephone.  Preventive Care 11 Years and Older, Male  Preventive care refers to lifestyle choices and visits with your health care provider that can promote health and wellness. What does preventive care include? A yearly physical exam. This is also called an annual well check. Dental exams once or twice a year. Routine eye exams. Ask your health care provider how often you should have your eyes checked. Personal lifestyle choices, including: Daily care of your teeth and gums. Regular physical activity. Eating a healthy diet. Avoiding tobacco and drug use. Limiting alcohol use. Practicing  safe sex. Taking low doses of aspirin every day. Taking vitamin and mineral supplements as recommended by your health care provider. What happens during an annual well check? The services and screenings done by your health care provider during your annual well check will depend on your age, overall health, lifestyle risk factors, and family history of disease. Counseling  Your health care provider may ask you questions about your: Alcohol use. Tobacco use. Drug use. Emotional well-being. Home and relationship well-being. Sexual activity. Eating habits. History of falls. Memory and ability to understand (cognition). Work and work Statistician. Screening  You may have the following tests or measurements: Height, weight, and BMI. Blood pressure. Lipid and cholesterol levels. These may be checked every 5 years, or more frequently if you are over 1 years old. Skin check. Lung cancer screening. You may have this screening every year starting at age 79 if you have a 30-pack-year history of smoking and currently smoke or have quit within the past 15 years. Fecal occult blood test (FOBT) of the stool. You may have this test every year starting at age 54. Flexible sigmoidoscopy or colonoscopy. You may have a sigmoidoscopy every 5 years or a colonoscopy every 10 years starting at age 65. Prostate cancer screening. Recommendations will vary depending on your family history and other risks. Hepatitis C blood test. Hepatitis B blood test. Sexually transmitted disease (STD) testing. Diabetes  screening. This is done by checking your blood sugar (glucose) after you have not eaten for a while (fasting). You may have this done every 1-3 years. Abdominal aortic aneurysm (AAA) screening. You may need this if you are a current or former smoker. Osteoporosis. You may be screened starting at age 59 if you are at high risk. Talk with your health care provider about your test results, treatment options, and  if necessary, the need for more tests. Vaccines  Your health care provider may recommend certain vaccines, such as: Influenza vaccine. This is recommended every year. Tetanus, diphtheria, and acellular pertussis (Tdap, Td) vaccine. You may need a Td booster every 10 years. Zoster vaccine. You may need this after age 28. Pneumococcal 13-valent conjugate (PCV13) vaccine. One dose is recommended after age 29. Pneumococcal polysaccharide (PPSV23) vaccine. One dose is recommended after age 28. Talk to your health care provider about which screenings and vaccines you need and how often you need them. This information is not intended to replace advice given to you by your health care provider. Make sure you discuss any questions you have with your health care provider. Document Released: 09/08/2015 Document Revised: 05/01/2016 Document Reviewed: 06/13/2015 Elsevier Interactive Patient Education  2017 [place] Prevention in the Home Falls can cause injuries. They can happen to people of all ages. There are many things you can do to make your home safe and to help prevent falls. What can I do on the outside of my home? Regularly fix the edges of walkways and driveways and fix any cracks. Remove anything that might make you trip as you walk through a door, such as a raised step or threshold. Trim any bushes or trees on the path to your home. Use bright outdoor lighting. Clear any walking paths of anything that might make someone trip, such as rocks or tools. Regularly check to see if handrails are loose or broken. Make sure that both sides of any steps have handrails. Any raised decks and porches should have guardrails on the edges. Have any leaves, snow, or ice cleared regularly. Use sand or salt on walking paths during winter. Clean up any spills in your garage right away. This includes oil or grease spills. What can I do in the bathroom? Use night lights. Install grab bars by the  toilet and in the tub and shower. Do not use towel bars as grab bars. Use non-skid mats or decals in the tub or shower. If you need to sit down in the shower, use a plastic, non-slip stool. Keep the floor dry. Clean up any water that spills on the floor as soon as it happens. Remove soap buildup in the tub or shower regularly. Attach bath mats securely with double-sided non-slip rug tape. Do not have throw rugs and other things on the floor that can make you trip. What can I do in the bedroom? Use night lights. Make sure that you have a light by your bed that is easy to reach. Do not use any sheets or blankets that are too big for your bed. They should not hang down onto the floor. Have a firm chair that has side arms. You can use this for support while you get dressed. Do not have throw rugs and other things on the floor that can make you trip. What can I do in the kitchen? Clean up any spills right away. Avoid walking on wet floors. Keep items that you use a lot in easy-to-reach places.  If you need to reach something above you, use a strong step stool that has a grab bar. Keep electrical cords out of the way. Do not use floor polish or wax that makes floors slippery. If you must use wax, use non-skid floor wax. Do not have throw rugs and other things on the floor that can make you trip. What can I do with my stairs? Do not leave any items on the stairs. Make sure that there are handrails on both sides of the stairs and use them. Fix handrails that are broken or loose. Make sure that handrails are as long as the stairways. Check any carpeting to make sure that it is firmly attached to the stairs. Fix any carpet that is loose or worn. Avoid having throw rugs at the top or bottom of the stairs. If you do have throw rugs, attach them to the floor with carpet tape. Make sure that you have a light switch at the top of the stairs and the bottom of the stairs. If you do not have them, ask someone  to add them for you. What else can I do to help prevent falls? Wear shoes that: Do not have high heels. Have rubber bottoms. Are comfortable and fit you well. Are closed at the toe. Do not wear sandals. If you use a stepladder: Make sure that it is fully opened. Do not climb a closed stepladder. Make sure that both sides of the stepladder are locked into place. Ask someone to hold it for you, if possible. Clearly mark and make sure that you can see: Any grab bars or handrails. First and last steps. Where the edge of each step is. Use tools that help you move around (mobility aids) if they are needed. These include: Canes. Walkers. Scooters. Crutches. Turn on the lights when you go into a dark area. Replace any light bulbs as soon as they burn out. Set up your furniture so you have a clear path. Avoid moving your furniture around. If any of your floors are uneven, fix them. If there are any pets around you, be aware of where they are. Review your medicines with your doctor. Some medicines can make you feel dizzy. This can increase your chance of falling. Ask your doctor what other things that you can do to help prevent falls. This information is not intended to replace advice given to you by your health care provider. Make sure you discuss any questions you have with your health care provider. Document Released: 06/08/2009 Document Revised: 01/18/2016 Document Reviewed: 09/16/2014 Elsevier Interactive Patient Education  2017 [employer].

## 2022-10-08 NOTE — Progress Notes (Signed)
I connected with  [name] on 10/08/22 by a audio enabled telemedicine application and verified that I am speaking with the correct person using two identifiers.  Patient Location: Home  Provider Location: Office/Clinic  I discussed the limitations of evaluation and management by telemedicine. The patient expressed understanding and agreed to proceed.  Subjective:   [name] is a 72 y.o. male who presents for Medicare Annual/Subsequent preventive examination.  Review of Systems     Cardiac Risk Factors include: advanced age (>86mn, >>72women);male gender     Objective:    Today's Vitals   10/08/22 0859 10/08/22 0901  Weight: 205 lb (93 kg)   Height: 6' 2"$  (1.88 m)   PainSc:  3    Body mass index is 26.32 kg/m.     10/08/2022    9:25 AM 11/14/2020    8:17 AM 11/01/2020    3:47 PM 12/24/2019   11:22 AM 05/08/2019   11:17 PM 07/12/2011    5:59 AM 07/08/2011    3:16 PM  Advanced Directives  Does Patient Have a Medical Advance Directive? No No No Yes No Patient does not have advance directive Patient does not have advance directive;Patient would not like information  Type of [place] of [place] will     Copy of [place] Chart?    No - copy requested     Would patient like information on creating a medical advance directive? No - Patient declined No - Patient declined No - Patient declined  No - Patient declined      Current Medications (verified) Outpatient Encounter Medications as of 10/08/2022  Medication Sig   clorazepate (TRANXENE) 7.5 MG tablet TAKE 1 TABLET(7.5 MG) BY MOUTH DAILY   ibuprofen (ADVIL,MOTRIN) 200 MG tablet Take 100-200 mg by mouth daily as needed for mild pain.   levothyroxine (SYNTHROID) 25 MCG tablet Take 1 tablet (25 mcg total) by mouth daily before breakfast.   Multiple Vitamin (MULTIVITAMIN WITH MINERALS) TABS tablet Take 1 tablet by mouth daily.   pantoprazole (PROTONIX) 40 MG  tablet TAKE 1 TABLET(40 MG) BY MOUTH EVERY MORNING 30 MINUTES BEFORE BREAKFAST   rivaroxaban (XARELTO) 20 MG TABS tablet Take 1 tablet (20 mg total) by mouth daily with supper. NEEDS LABS, COME TO OFFICE   sertraline (ZOLOFT) 50 MG tablet Take 1 tablet (50 mg total) by mouth daily.   SIMBRINZA 1-0.2 % SUSP Place 1 drop into both eyes 3 (three) times daily.   valACYclovir (VALTREX) 1000 MG tablet Take 1 tablet (1,000 mg total) by mouth 3 (three) times daily.   zolpidem (AMBIEN CR) 12.5 MG CR tablet TAKE 1 TABLET(12.5 MG) BY MOUTH AT BEDTIME   PROLENSA 0.07 % SOLN Place 1 drop into the right eye See admin instructions. (Patient not taking: Reported on 09/16/2022)   No facility-administered encounter medications on file as of 10/08/2022.    Allergies (verified) Crestor [rosuvastatin], Escitalopram oxalate, Latex, and Sulfonamide derivatives   History: Past Medical History:  Diagnosis Date   Alcohol abuse, unspecified    history of   Anxiety    Arthritis    all over   Bladder cancer ([facility] 2011   Dr. NJanice Norrie  CAD (coronary artery disease) 2012   MI   Depression    Dyslipidemia    GERD (gastroesophageal reflux disease)    History of DVT (deep vein thrombosis) 11/08 and again in 2014   previously on coumadin  Hypertension    Hypothyroidism    Inguinal hernia without mention of obstruction or gangrene, unilateral or unspecified, (not specified as recurrent)    Internal hemorrhoids without mention of complication    Macular degeneration, wet ([place])    Myocardial infarction [facility])    2011    Other abnormal glucose    Personal history of other disorder of urinary system    Pneumonia    hx of 12/2019    Pulmonary embolism ([place])    hx of    Stroke ([place]) 05/25/2005   TIA   Thrombocytopenia, unspecified ([place])    TIA (transient ischemic attack) 2008   Past Surgical History:  Procedure Laterality Date   CARDIAC CATHETERIZATION  05/2011   mild atherosclerosis   CYSTOSCOPY  07/12/2011    Procedure: CYSTOSCOPY;  Surgeon: Hanley Ben, MD;  Location: [facility];  Service: Urology;  Laterality: N/A;  one hour being requested for this case   EYE SURGERY Left    injection from wet AMD (age related macular degeneration)   HERNIA REPAIR  8/11   repair of right indirect and direct hernias; incarerated umbilical hernia containing preperitoneal fat   INGUINAL HERNIA REPAIR Left 11/14/2020   Procedure: OPEN LEFT INGUINAL HERNIA REPAIR WITH MESH;  Surgeon: Johnathan Hausen, MD;  Location: [facility];  Service: General;  Laterality: Left;  90 MIN   SHOULDER SURGERY  03/08/08   Left; arthroscopy   TONSILLECTOMY     as child   TRANSURETHRAL RESECTION OF BLADDER TUMOR  07/12/2011   Procedure: TRANSURETHRAL RESECTION OF BLADDER TUMOR (TURBT);  Surgeon: Hanley Ben, MD;  Location: [facility];  Service: Urology;  Laterality: N/A;   Family History  Problem Relation Age of Onset   Other Father        Hypotension from medication causing GI bleed   Heart failure Father    Emphysema Mother    Prostate cancer Neg Hx    Social History   Socioeconomic History   Marital status: Single    Spouse name: Not on file   Number of children: 0   Years of education: Not on file   Highest education level: Not on file  Occupational History   Occupation: Environmental manager  Tobacco Use   Smoking status: Former    Packs/day: 1.50    Years: 25.00    Total pack years: 37.50    Types: Cigarettes    Quit date: 08/26/2004    Years since quitting: 18.1   Smokeless tobacco: Never  Vaping Use   Vaping Use: Never used  Substance and Sexual Activity   Alcohol use: No    Alcohol/week: 0.0 standard drinks of alcohol    Comment: Quit 2009   Drug use: Not Currently    Frequency: 2.0 times per week    Types: Marijuana    Comment: MJ-regular use  10/2013 no longer uses   Sexual activity: Yes  Other Topics Concern   Not on file  Social History Narrative   Environmental manager   Single; 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    Social Determinants of Health   Financial Resource Strain: Low Risk  (10/08/2022)   Overall Financial Resource Strain (CARDIA)    Difficulty of Paying Living Expenses: Not hard at all  Food Insecurity: No Food Insecurity (10/08/2022)   Hunger Vital Sign    Worried About Running Out of Food in the Last Year: Never true    Ran Out of Food in the Last Year: Never true  Transportation  Needs: No Transportation Needs (10/08/2022)   PRAPARE - Hydrologist (Medical): No    Lack of Transportation (Non-Medical): No  Physical Activity: Sufficiently Active (10/08/2022)   Exercise Vital Sign    Days of Exercise per Week: 7 days    Minutes of Exercise per Session: 30 min  Stress: Stress Concern Present (10/08/2022)   [place]    Feeling of Stress : To some extent  Social Connections: Socially Isolated (10/08/2022)   Social Connection and Isolation Panel [NHANES]    Frequency of Communication with Friends and Family: Twice a week    Frequency of Social Gatherings with Friends and Family: Never    Attends Religious Services: Never    Printmaker: No    Attends Music therapist: Never    Marital Status: Never married    Tobacco Counseling Counseling given: Not Answered   Clinical Intake:  Pre-visit preparation completed: Yes  Pain : 0-10 (Shingles) Pain Score: 3  Pain Type: Acute pain (Shingles) Pain Location: Rib cage Pain Orientation: Lateral, Left Pain Descriptors / Indicators: Dull Pain Onset: More than a month ago Pain Frequency: Intermittent Pain Relieving Factors: lidocaine patch Effect of Pain on Daily Activities: none  Pain Relieving Factors: lidocaine patch  Nutritional Risks: None Diabetes: No  How often do you need to have someone help you when you read instructions, pamphlets, or other written materials from your doctor or pharmacy?:  1 - Never  Diabetic? no  Interpreter Needed?: No  Information entered by :: C.Riah Kehoe   Activities of Daily Living    10/08/2022    9:26 AM  In your present state of health, do you have any difficulty performing the following activities:  Hearing? 1  Comment getting aids  Vision? 1  Comment macular degeneration  Difficulty concentrating or making decisions? 0  Walking or climbing stairs? 0  Dressing or bathing? 0  Doing errands, shopping? 0  Preparing Food and eating ? N  Using the Toilet? N  In the past six months, have you accidently leaked urine? N  Do you have problems with loss of bowel control? N  Managing your Medications? N  Managing your Finances? N  Housekeeping or managing your Housekeeping? N    Patient Care Team: Tonia Ghent, MD as PCP - General Croitoru, Dani Gobble, MD as PCP - Cardiology (Cardiology) Katy Fitch, Darlina Guys, MD as Consulting Physician (Ophthalmology) Charlton Haws, [facility] as Pharmacist (Pharmacist)  Indicate any recent Medical Services you may have received from other than [facility] providers in the past year (date may be approximate).     Assessment:   This is a routine wellness examination for [place].  Hearing/Vision screen Hearing Screening - Comments:: Picking up hearing aids soon, they have been ordered. Vision Screening - Comments:: Readers - Dr. Katy Fitch - wet macular degeneration  Dietary issues and exercise activities discussed: Current Exercise Habits: Home exercise routine, Type of exercise: strength training/weights;walking, Time (Minutes): 30, Frequency (Times/Week): 7, Weekly Exercise (Minutes/Week): 210, Intensity: Moderate   Goals Addressed             This Visit's Progress    Patient Stated       To clean the storage house out torn down.       Depression Screen    10/08/2022    9:21 AM 10/08/2022    9:15 AM 08/04/2020   11:40 AM 12/24/2019   11:28  AM  PHQ 2/9 Scores  PHQ - 2 Score 1 0 0 0  PHQ- 9 Score     0  Exception Documentation Medical reason       Fall Risk    10/08/2022    9:07 AM 08/04/2020   11:40 AM 12/24/2019   11:25 AM  [place] in the past year? 0 0 1  Comment   fainted  Number falls in past yr: 0 0 0  Injury with Fall? 0 0 0  Risk for fall due to : No Fall Risks    Follow up Falls prevention discussed;Falls evaluation completed Falls evaluation completed Falls evaluation completed;Falls prevention discussed    FALL RISK PREVENTION PERTAINING TO THE HOME:  Any stairs in or around the home? Yes  If so, are there any without handrails? Yes  Home free of loose throw rugs in walkways, pet beds, electrical cords, etc? Yes  Adequate lighting in your home to reduce risk of falls? Yes   ASSISTIVE DEVICES UTILIZED TO PREVENT FALLS:  Life alert? No  Use of a cane, walker or w/c? No  Grab bars in the bathroom? No  Shower chair or bench in shower? No  Elevated toilet seat or a handicapped toilet? No    Cognitive Function:    12/24/2019   11:33 AM  MMSE - Mini Mental State Exam  Orientation to time 5  Orientation to Place 5  Registration 3  Attention/ Calculation 5  Recall 3  Language- repeat 1        10/08/2022    9:33 AM  6CIT Screen  What Year? 0 points  What month? 0 points  What time? 0 points  Count back from 20 0 points  Months in reverse 0 points  Repeat phrase 0 points  Total Score 0 points    Immunizations Immunization History  Administered Date(s) Administered   Influenza Split 08/28/2012   Influenza Whole 05/26/2008   Influenza, High Dose Seasonal PF 07/29/2018, 05/12/2019   Influenza, Seasonal, Injecte, Preservative Fre 06/20/2016   Influenza-Unspecified 07/17/2015, 06/18/2017   Janssen (J&J) SARS-COV-2 Vaccination 11/29/2019   PFIZER(Purple Top)SARS-COV-2 Vaccination 08/10/2020   Pneumococcal Polysaccharide-23 04/26/2005, 05/12/2019   Td 08/29/2008    TDAP status: Due, Education has been provided regarding the importance of  this vaccine. Advised may receive this vaccine at local pharmacy or Health Dept. Aware to provide a copy of the vaccination record if obtained from local pharmacy or Health Dept. Verbalized acceptance and understanding.  Flu Vaccine status: Declined, Education has been provided regarding the importance of this vaccine but patient still declined. Advised may receive this vaccine at local pharmacy or Health Dept. Aware to provide a copy of the vaccination record if obtained from local pharmacy or Health Dept. Verbalized acceptance and understanding.  Pneumococcal vaccine status: Up to date  Covid-19 vaccine status: Completed vaccines  Qualifies for Shingles Vaccine? Yes   Zostavax completed No   Shingrix Completed?: No.    Education has been provided regarding the importance of this vaccine. Patient has been advised to call insurance company to determine out of pocket expense if they have not yet received this vaccine. Advised may also receive vaccine at local pharmacy or Health Dept. Verbalized acceptance and understanding.  Screening Tests Health Maintenance  Topic Date Due   Hepatitis C Screening  Never done   Zoster Vaccines- Shingrix (1 of 2) Never done   DTaP/Tdap/Td (2 - Tdap) 08/29/2018   Pneumonia Vaccine 56+ Years old (  2 of 2 - PCV) 05/11/2020   INFLUENZA VACCINE  03/26/2022   COVID-19 Vaccine (3 - 2023-24 season) 04/26/2022   Medicare Annual Wellness (AWV)  10/09/2023   COLONOSCOPY (Pts 45-16yr Insurance coverage will need to be confirmed)  10/10/2030   HPV VACCINES  Aged Out    Health Maintenance  Health Maintenance Due  Topic Date Due   Hepatitis C Screening  Never done   Zoster Vaccines- Shingrix (1 of 2) Never done   DTaP/Tdap/Td (2 - Tdap) 08/29/2018   Pneumonia Vaccine 72 Years old (2 of 2 - PCV) 05/11/2020   INFLUENZA VACCINE  03/26/2022   COVID-19 Vaccine (3 - 2023-24 season) 04/26/2022    Colorectal cancer screening: Type of screening: Colonoscopy. Completed  10/10/2020. Repeat every 10 years  Lung Cancer Screening: (Low Dose CT Chest recommended if Age 72-80years, 30 pack-year currently smoking OR have quit w/in 15years.) does not qualify.   Lung Cancer Screening Referral: no  Additional Screening:  Hepatitis C Screening: does qualify; not done  Vision Screening: Recommended annual ophthalmology exams for Philip detection of glaucoma and other disorders of the eye. Is the patient up to date with their annual eye exam?  Yes  Who is the provider or what is the name of the office in which the patient attends annual eye exams? Dr.Groat If pt is not established with a provider, would they like to be referred to a provider to establish care? No .   Dental Screening: Recommended annual dental exams for proper oral hygiene  Community Resource Referral / Chronic Care Management: CRR required this visit?  No   CCM required this visit?  No      Plan:     I have personally reviewed and noted the following in the patient's chart:   Medical and social history Use of alcohol, tobacco or illicit drugs  Current medications and supplements including opioid prescriptions. Patient is not currently taking opioid prescriptions. Functional ability and status Nutritional status Physical activity Advanced directives List of other physicians Hospitalizations, surgeries, and ER visits in previous 12 months Vitals Screenings to include cognitive, depression, and falls Referrals and appointments  In addition, I have reviewed and discussed with patient certain preventive protocols, quality metrics, and best practice recommendations. A written personalized care plan for preventive services as well as general preventive health recommendations were provided to patient.     CLebron Conners LPN   [id number]  Nurse Notes: none

## 2022-10-28 ENCOUNTER — Ambulatory Visit: Payer: Medicare Other | Admitting: Family Medicine

## 2022-11-01 ENCOUNTER — Encounter: Payer: Self-pay | Admitting: Family Medicine

## 2022-11-01 ENCOUNTER — Ambulatory Visit (INDEPENDENT_AMBULATORY_CARE_PROVIDER_SITE_OTHER): Payer: Medicare Other | Admitting: Family Medicine

## 2022-11-01 VITALS — BP 110/62 | HR 60 | Temp 97.7°F | Ht 74.0 in | Wt 212.0 lb

## 2022-11-01 DIAGNOSIS — F411 Generalized anxiety disorder: Secondary | ICD-10-CM | POA: Diagnosis not present

## 2022-11-01 DIAGNOSIS — R059 Cough, unspecified: Secondary | ICD-10-CM

## 2022-11-01 MED ORDER — SERTRALINE HCL 50 MG PO TABS: 50.0000 mg | ORAL_TABLET | Freq: Every day | ORAL | 5 refills | Status: DC

## 2022-11-01 NOTE — Patient Instructions (Signed)
Update me as needed.  If you want to get the lung cancer screening set up then let me know.  Take care.  Glad to see you.

## 2022-11-01 NOTE — Progress Notes (Signed)
Cough. Was sick in 08/2022, then had shingles.  Done with valtrex.  He has dermatomal sensitivity that can flare up now but that is better than prev.  Shingles pain never cross the midline.  He had HA, cough.  Some sputum, usually dry at night.  Sputum is clear.  No fevers.  No vomiting. Former smoker.  Cough is slowly getting better.  Has h/o hearing loss.  20/20 R eye, 20/200 L eye, stressors d/w pt.    D/w pt about lung cancer screening program and he'll consider.  He'll update me as needed.    Meds, vitals, and allergies reviewed.   ROS: Per HPI unless specifically indicated in ROS section   GEN: nad, alert and oriented HEENT: ncat NECK: supple w/o LA CV: rrr. PULM: ctab, no inc wob ABD: soft, +bs EXT: no edema SKIN: no acute rash

## 2022-11-02 NOTE — Assessment & Plan Note (Signed)
Likely with a postinfectious cough that should eventually resolve.  He can update [place] as needed.  Separate issue is shingles which fortunately is improving/has improved.  Discussed lung cancer screening program and he can consider.  See above.  Rationale for screening discussed with patient.

## 2022-11-06 ENCOUNTER — Other Ambulatory Visit: Payer: Self-pay | Admitting: Adult Health

## 2022-11-06 ENCOUNTER — Telehealth: Payer: Self-pay | Admitting: Adult Health

## 2022-11-06 DIAGNOSIS — G4700 Insomnia, unspecified: Secondary | ICD-10-CM

## 2022-11-06 DIAGNOSIS — F411 Generalized anxiety disorder: Secondary | ICD-10-CM

## 2022-11-06 MED ORDER — CLORAZEPATE DIPOTASSIUM 7.5 MG PO TABS: ORAL_TABLET | ORAL | 2 refills | Status: DC

## 2022-11-06 MED ORDER — ZOLPIDEM TARTRATE ER 12.5 MG PO TBCR: EXTENDED_RELEASE_TABLET | ORAL | 2 refills | Status: DC

## 2022-11-06 NOTE — Telephone Encounter (Signed)
Pt lvm that the [facility] closed on [address]  and so he needs his ambien cr 12.5 mg and his clorazapate 7.5 mg sent to [facility] on [address]. This will be his new pharmacy please update file

## 2022-11-06 NOTE — Telephone Encounter (Signed)
Script sent  

## 2022-11-18 ENCOUNTER — Telehealth: Payer: Self-pay | Admitting: Psychiatry

## 2022-11-18 ENCOUNTER — Telehealth: Payer: Self-pay | Admitting: Adult Health

## 2022-11-18 NOTE — Telephone Encounter (Signed)
Tranxene script sent to where pt requested; encounter not needed

## 2022-11-18 NOTE — Telephone Encounter (Signed)
Wrong provider

## 2022-11-19 ENCOUNTER — Telehealth: Payer: Self-pay | Admitting: Adult Health

## 2022-11-19 ENCOUNTER — Other Ambulatory Visit: Payer: Self-pay

## 2022-11-19 DIAGNOSIS — F411 Generalized anxiety disorder: Secondary | ICD-10-CM

## 2022-11-19 MED ORDER — CLORAZEPATE DIPOTASSIUM 7.5 MG PO TABS: ORAL_TABLET | ORAL | 2 refills | Status: DC

## 2022-11-19 NOTE — Telephone Encounter (Signed)
Rx had been previously sent but pharmacy said they didn't get it. Repended.

## 2022-11-19 NOTE — Telephone Encounter (Signed)
[name] called at 3:45 to report that he went to the pharmacy, [facility] [address] to get his prescriptions and he got his zolpidem but they told him they did not have a prescription for his clorazepate.  They said they have not received a prescription.  Please resend.

## 2022-11-20 DIAGNOSIS — L989 Disorder of the skin and subcutaneous tissue, unspecified: Secondary | ICD-10-CM | POA: Diagnosis not present

## 2022-11-20 DIAGNOSIS — D492 Neoplasm of unspecified behavior of bone, soft tissue, and skin: Secondary | ICD-10-CM | POA: Diagnosis not present

## 2022-12-12 ENCOUNTER — Encounter: Payer: Self-pay | Admitting: Adult Health

## 2022-12-12 ENCOUNTER — Ambulatory Visit (INDEPENDENT_AMBULATORY_CARE_PROVIDER_SITE_OTHER): Payer: Medicare Other | Admitting: Adult Health

## 2022-12-12 DIAGNOSIS — G4700 Insomnia, unspecified: Secondary | ICD-10-CM | POA: Diagnosis not present

## 2022-12-12 DIAGNOSIS — F411 Generalized anxiety disorder: Secondary | ICD-10-CM

## 2022-12-12 MED ORDER — ZOLPIDEM TARTRATE ER 12.5 MG PO TBCR: EXTENDED_RELEASE_TABLET | ORAL | 2 refills | Status: DC

## 2022-12-12 MED ORDER — CLORAZEPATE DIPOTASSIUM 7.5 MG PO TABS: ORAL_TABLET | ORAL | 2 refills | Status: DC

## 2022-12-12 MED ORDER — SERTRALINE HCL 100 MG PO TABS: 50.0000 mg | ORAL_TABLET | Freq: Every day | ORAL | Status: DC

## 2022-12-12 NOTE — Progress Notes (Signed)
[name] [id number] 1951/01/02 72 y.o.  Virtual Visit via Telephone Note  I connected with pt on 12/12/22 at  9:40 AM EDT by telephone and verified that I am speaking with the correct person using two identifiers.   I discussed the limitations, risks, security and privacy concerns of performing an evaluation and management service by telephone and the availability of in person appointments. I also discussed with the patient that there may be a patient responsible charge related to this service. The patient expressed understanding and agreed to proceed.   I discussed the assessment and treatment plan with the patient. The patient was provided an opportunity to ask questions and all were answered. The patient agreed with the plan and demonstrated an understanding of the instructions.   The patient was advised to call back or seek an in-person evaluation if the symptoms worsen or if the condition fails to improve as anticipated.  I provided 15 minutes of non-face-to-face time during this encounter.  The patient was located at home.  The provider was located at [facility].   Dorothyann Gibbs, NP   Subjective:   Patient ID:  [name] is a 72 y.o. (DOB 1950-11-17) male.  Chief Complaint: No chief complaint on file.   HPI [name] presents for follow-up of insomnia, anxiety and depression.   Describes mood today as "ok". Pleasant. Denies tearfulness. Mood symptoms - reports depression, anxiety, and irritability. Reports worry, rumination, and over thinking. Mood is consistent. Stating "I'm doing alright". Reports ongoing situational stressors. Concerned about various medical issues. Reports financial stressors. Feels like medications are helpful. Stable interest and motivation. Taking medications as prescribed.  Energy levels stable. Active, does not have a regular exercise routine.   Enjoys some usual interests and activities. Single. Lives with a roommate  - 1 cat and a dog. Mostly staying home.  Appetite adequate. Weight loss - 200 pounds. Sleeps well most nights. Averages 8 hours. Focus and concentration stable. Completing tasks. Managing aspects of household. Retired, but works part time. Denies SI or HI.  Denies AH or VH. Denies self harm. Denies substance use.  Previous medication trials: Zoloft, Ambien, Tranxene    Review of Systems:  Review of Systems  Musculoskeletal:  Negative for gait problem.  Neurological:  Negative for tremors.  Psychiatric/Behavioral:         Please refer to HPI    Medications: I have reviewed the patient's current medications.  Current Outpatient Medications  Medication Sig Dispense Refill   clorazepate (TRANXENE) 7.5 MG tablet TAKE 1 TABLET(7.5 MG) BY MOUTH DAILY 30 tablet 2   ibuprofen (ADVIL,MOTRIN) 200 MG tablet Take 100-200 mg by mouth daily as needed for mild pain.     levothyroxine (SYNTHROID) 25 MCG tablet Take 1 tablet (25 mcg total) by mouth daily before breakfast. 90 tablet 1   Multiple Vitamin (MULTIVITAMIN WITH MINERALS) TABS tablet Take 1 tablet by mouth daily.     pantoprazole (PROTONIX) 40 MG tablet TAKE 1 TABLET(40 MG) BY MOUTH EVERY MORNING 30 MINUTES BEFORE BREAKFAST 30 tablet 6   PROLENSA 0.07 % SOLN Place 1 drop into the right eye See admin instructions.     rivaroxaban (XARELTO) 20 MG TABS tablet Take 1 tablet (20 mg total) by mouth daily with supper. NEEDS LABS, COME TO OFFICE 30 tablet 5   sertraline (ZOLOFT) 50 MG tablet Take 1 tablet (50 mg total) by mouth daily. 30 tablet 5   SIMBRINZA 1-0.2 % SUSP Place 1 drop  into both eyes 3 (three) times daily.     zolpidem (AMBIEN CR) 12.5 MG CR tablet TAKE 1 TABLET(12.5 MG) BY MOUTH AT BEDTIME 30 tablet 2   No current facility-administered medications for this visit.    Medication Side Effects: None  Allergies:  Allergies  Allergen Reactions   Crestor [Rosuvastatin]     Mood changes   Escitalopram Oxalate     Mood changes,  irritablity   Latex Other (See Comments)    Blood in urine with latex catheter- but this was likely from mechanical irritation, not from the latex itself   Sulfonamide Derivatives Rash    Past Medical History:  Diagnosis Date   Alcohol abuse, unspecified    history of   Anxiety    Arthritis    all over   Bladder cancer [facility]) 2011   Dr. Brunilda Payor   CAD (coronary artery disease) 2012   MI   Depression    Dyslipidemia    GERD (gastroesophageal reflux disease)    History of DVT (deep vein thrombosis) 11/08 and again in 2014   previously on coumadin   Hypertension    Hypothyroidism    Inguinal hernia without mention of obstruction or gangrene, unilateral or unspecified, (not specified as recurrent)    Internal hemorrhoids without mention of complication    Macular degeneration, wet (HCC)    Myocardial infarction (HCC)    2011    Other abnormal glucose    Personal history of other disorder of urinary system    Pneumonia    hx of 12/2019    Pulmonary embolism (HCC)    hx of    Stroke (HCC) 05/25/2005   TIA   Thrombocytopenia, unspecified (HCC)    TIA (transient ischemic attack) 2008    Family History  Problem Relation Age of Onset   Other Father        Hypotension from medication causing GI bleed   Heart failure Father    Emphysema Mother    Prostate cancer Neg Hx     Social History   Socioeconomic History   Marital status: Single    Spouse name: Not on file   Number of children: 0   Years of education: Not on file   Highest education level: Not on file  Occupational History   Occupation: Photographer  Tobacco Use   Smoking status: Former    Packs/day: 1.50    Years: 25.00    Additional pack years: 0.00    Total pack years: 37.50    Types: Cigarettes    Quit date: 08/26/2004    Years since quitting: 18.3   Smokeless tobacco: Never  Vaping Use   Vaping Use: Never used  Substance and Sexual Activity   Alcohol use: No    Alcohol/week: 0.0 standard drinks of  alcohol    Comment: Quit 2009   Drug use: Not Currently    Frequency: 2.0 times per week    Types: Marijuana    Comment: MJ-regular use  10/2013 no longer uses   Sexual activity: Yes  Other Topics Concern   Not on file  Social History Narrative   Photographer   Single; 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   Social Determinants of Health   Financial Resource Strain: Low Risk  (10/08/2022)   Overall Financial Resource Strain (CARDIA)    Difficulty of Paying Living Expenses: Not hard at all  Food Insecurity: No Food Insecurity (10/08/2022)   Hunger Vital Sign    Worried About  Running Out of Food in the Last Year: Never true    Ran Out of Food in the Last Year: Never true  Transportation Needs: No Transportation Needs (10/08/2022)   PRAPARE - Administrator, Civil Service (Medical): No    Lack of Transportation (Non-Medical): No  Physical Activity: Sufficiently Active (10/08/2022)   Exercise Vital Sign    Days of Exercise per Week: 7 days    Minutes of Exercise per Session: 30 min  Stress: Stress Concern Present (10/08/2022)   [employer] of Occupational Health - Occupational Stress Questionnaire    Feeling of Stress : To some extent  Social Connections: Socially Isolated (10/08/2022)   Social Connection and Isolation Panel [NHANES]    Frequency of Communication with Friends and Family: Twice a week    Frequency of Social Gatherings with Friends and Family: Never    Attends Religious Services: Never    Database administrator or Organizations: No    Attends Banker Meetings: Never    Marital Status: Never married  Intimate Partner Violence: Not At Risk (10/08/2022)   Humiliation, Afraid, Rape, and Kick questionnaire    Fear of Current or Ex-Partner: No    Emotionally Abused: No    Physically Abused: No    Sexually Abused: No    Past Medical History, Surgical history, Social history, and Family history were reviewed and updated as appropriate.   Please  see review of systems for further details on the patient's review from today.   Objective:   Physical Exam:  There were no vitals taken for this visit.  Physical Exam Constitutional:      General: He is not in acute distress. Musculoskeletal:        General: No deformity.  Neurological:     Mental Status: He is alert and oriented to person, place, and time.     Coordination: Coordination normal.  Psychiatric:        Attention and Perception: Attention and perception normal. He does not perceive auditory or visual hallucinations.        Mood and Affect: Mood normal. Mood is not anxious or depressed. Affect is not labile, blunt, angry or inappropriate.        Speech: Speech normal.        Behavior: Behavior normal.        Thought Content: Thought content normal. Thought content is not paranoid or delusional. Thought content does not include homicidal or suicidal ideation. Thought content does not include homicidal or suicidal plan.        Cognition and Memory: Cognition and memory normal.        Judgment: Judgment normal.     Comments: Insight intact     Lab Review:     Component Value Date/Time   NA 136 11/01/2020 1043   NA 137 02/18/2019 1549   K 4.1 11/01/2020 1043   CL 103 11/01/2020 1043   CO2 23 11/01/2020 1043   GLUCOSE 113 (H) 11/01/2020 1043   BUN 21 11/01/2020 1043   BUN 21 02/18/2019 1549   CREATININE 0.92 11/01/2020 1043   CREATININE 0.88 01/02/2017 0953   CALCIUM 9.3 11/01/2020 1043   PROT 7.3 11/01/2020 1043   ALBUMIN 3.9 11/01/2020 1043   AST 34 11/01/2020 1043   ALT 42 11/01/2020 1043   ALKPHOS 33 (L) 11/01/2020 1043   BILITOT 1.4 (H) 11/01/2020 1043   GFRNONAA >60 11/01/2020 1043   GFRAA >60 02/25/2020 1254  Component Value Date/Time   WBC 9.4 11/01/2020 1043   RBC 5.50 11/01/2020 1043   HGB 18.2 (H) 11/01/2020 1043   HGB 17.4 02/18/2019 1549   HCT 52.8 (H) 11/01/2020 1043   HCT 48.6 02/18/2019 1549   PLT 184 11/01/2020 1043   PLT 170  02/18/2019 1549   MCV 96.0 11/01/2020 1043   MCV 90 02/18/2019 1549   MCH 33.1 11/01/2020 1043   MCHC 34.5 11/01/2020 1043   RDW 13.2 11/01/2020 1043   RDW 12.5 02/18/2019 1549   LYMPHSABS 1.3 01/03/2020 0910   MONOABS 0.5 01/03/2020 0910   EOSABS 0.1 01/03/2020 0910   BASOSABS 0.0 01/03/2020 0910    No results found for: "POCLITH", "LITHIUM"   No results found for: "PHENYTOIN", "PHENOBARB", "VALPROATE", "CBMZ"   .res Assessment: Plan:    Plan:  PDMP reviewed  Zoloft  daily - using previously prescribed medications Ambien CR 12.5mg  daily  Chlorazepate 7.5mg  daily   RTC 6 months  Patient advised to contact office with any questions, adverse effects, or acute worsening in signs and symptoms.  Discussed potential benefits, risk, and side effects of benzodiazepines to include potential risk of tolerance and dependence, as well as possible drowsiness.  Advised patient not to drive if experiencing drowsiness and to take lowest possible effective dose to minimize risk of dependence and tolerance.  Time spent with patient was 20 minutes. Greater than 50% of face to face time with patient was spent on counseling and coordination of care.    There are no diagnoses linked to this encounter.  Please see After Visit Summary for patient specific instructions.  Future Appointments  Date Time Provider Department Center  10/13/2023  9:00 AM [facility] ANNUAL WELLNESS VISIT 1 [facility]    No orders of the defined types were placed in this encounter.     -------------------------------

## 2022-12-26 DIAGNOSIS — I8391 Asymptomatic varicose veins of right lower extremity: Secondary | ICD-10-CM | POA: Diagnosis not present

## 2022-12-26 DIAGNOSIS — D492 Neoplasm of unspecified behavior of bone, soft tissue, and skin: Secondary | ICD-10-CM | POA: Diagnosis not present

## 2022-12-26 DIAGNOSIS — L989 Disorder of the skin and subcutaneous tissue, unspecified: Secondary | ICD-10-CM | POA: Diagnosis not present

## 2023-01-01 DIAGNOSIS — D492 Neoplasm of unspecified behavior of bone, soft tissue, and skin: Secondary | ICD-10-CM | POA: Diagnosis not present

## 2023-01-07 ENCOUNTER — Other Ambulatory Visit (INDEPENDENT_AMBULATORY_CARE_PROVIDER_SITE_OTHER): Payer: Medicare Other

## 2023-01-07 ENCOUNTER — Ambulatory Visit: Payer: Medicare Other | Admitting: Orthopedic Surgery

## 2023-01-07 DIAGNOSIS — M79674 Pain in right toe(s): Secondary | ICD-10-CM | POA: Diagnosis not present

## 2023-01-16 ENCOUNTER — Encounter: Payer: Self-pay | Admitting: Orthopedic Surgery

## 2023-01-16 NOTE — Progress Notes (Signed)
Office Visit Note   Patient: [name]           Date of Birth: 1951/03/30           MRN: [id number] Visit Date: 01/07/2023              Requested by: Joaquim Nam, MD [address] [place],  [place] [address] PCP: Joaquim Nam, MD  Chief Complaint  Patient presents with   Right Foot - Pain, Injury      HPI: Patient is a 72 year old gentleman who presents with right foot second toe pain he is status post fusion in 2009 of the second toe.  He was kicking a tennis ball for his dog missed and hit the ground 6 months ago.  Patient is concerned for fracture.  Assessment & Plan: Visit Diagnoses:  1. Toe pain, right     Plan: Recommended a stiff soled shoe and/or carbon plate to decrease stress across the forefoot.  A protective pad was placed in the first webspace to protect the ulcer in the first webspace.  Follow-Up Instructions: Return if symptoms worsen or fail to improve.   Ortho Exam  Patient is alert, oriented, no adenopathy, well-dressed, normal affect, normal respiratory effort. Examination patient has no range of motion of the great toe secondary to hallux rigidus.  There is a fused PIP joint of the second toe which is painful to palpation.  No fractures were seen on the x-ray.  There is a ulcer in the first webspace and a pad was applied.  Imaging: No results found. No images are attached to the encounter.  Labs: No results found for: "HGBA1C", "ESRSEDRATE", "CRP", "LABURIC", "REPTSTATUS", "GRAMSTAIN", "CULT", "LABORGA"   Lab Results  Component Value Date   ALBUMIN 3.9 11/01/2020   ALBUMIN 4.0 01/03/2020   ALBUMIN 3.8 05/08/2019    Lab Results  Component Value Date   MG 2.1 11/01/2020   MG 2.3 02/18/2019   No results found for: "VD25OH"  No results found for: "PREALBUMIN"    Latest Ref Rng & Units 11/01/2020   10:43 AM 02/25/2020   12:54 PM 01/03/2020    9:10 AM  CBC EXTENDED  WBC 4.0 - 10.5 K/uL 9.4  5.9  4.0   RBC 4.22 - 5.81  MIL/uL 5.50  5.28  4.95   Hemoglobin 13.0 - 17.0 g/dL 19.1  47.8  29.5   HCT 39.0 - 52.0 % 52.8  50.8  47.9   Platelets 150 - 400 K/uL 184  160  145.0   NEUT# 1.4 - 7.7 K/uL   2.1   Lymph# 0.7 - 4.0 K/uL   1.3      There is no height or weight on file to calculate BMI.  Orders:  Orders Placed This Encounter  Procedures   XR Foot Complete Right   No orders of the defined types were placed in this encounter.    Procedures: No procedures performed  Clinical Data: No additional findings.  ROS:  All other systems negative, except as noted in the HPI. Review of Systems  Objective: Vital Signs: There were no vitals taken for this visit.  Specialty Comments:  No specialty comments available.  PMFS History: Patient Active Problem List   Diagnosis Date Noted   Varicella zoster 09/16/2022   Sore throat 12/21/2021   Acute non-recurrent frontal sinusitis 12/21/2021   Rash 10/04/2020   Glaucoma suspect of both eyes 10/03/2020   Cataract, nuclear sclerotic, left eye 08/08/2020  Pain of finger 08/06/2020   Abdominal wall pain 08/06/2020   Exudative age-related macular degeneration of left eye with inactive choroidal neovascularization (HCC) 03/14/2020   Advanced nonexudative age-related macular degeneration of right eye without subfoveal involvement 03/14/2020   Advanced nonexudative age-related macular degeneration of left eye with subfoveal involvement 03/14/2020   Healthcare maintenance 01/24/2020   Advance care planning 01/24/2020   Hypothyroidism 01/24/2020   Vasovagal syncope 05/16/2019   Other social stressor 02/27/2019   FH: CAD (coronary artery disease) 02/27/2019   Polycythemia 01/22/2019   Thunderclap headache 12/25/2018   Ocular migraine 12/25/2018   Rhinorrhea 12/25/2018   Diarrhea 12/25/2018   Postphlebitic syndrome 03/15/2018   Long term (current) use of anticoagulants 03/15/2018   Varicose veins 03/29/2015   Lumbar back pain 11/15/2014   CAD - minor -  30-40% LAD, 30% RCA 10/12 06/24/2013   Essential tremor 01/17/2012   Pure hypercholesterolemia 06/29/2011   Cough 05/28/2011   History of bladder cancer 12/24/2010   TRANSIENT ISCHEMIC ATTACKS, HX OF 05/01/2010   HEMATURIA, HX OF 05/01/2010   Thrombocytopenia (HCC) 01/08/2010   Inguinal hernia without obstruction or gangrene 02/01/2009   HYPERGLYCEMIA 08/30/2008   SHOULDER PAIN, LEFT 02/08/2008   History of DVT (deep vein thrombosis) 08/08/2007   Acute thromboembolism of deep veins of lower extremity (HCC) 08/08/2007   Anxiety state 12/30/2006   Mood disorder (HCC) 12/30/2006   Past Medical History:  Diagnosis Date   Alcohol abuse, unspecified    history of   Anxiety    Arthritis    all over   Bladder cancer [facility]) 2011   Dr. Brunilda Payor   CAD (coronary artery disease) 2012   MI   Depression    Dyslipidemia    GERD (gastroesophageal reflux disease)    History of DVT (deep vein thrombosis) 11/08 and again in 2014   previously on coumadin   Hypertension    Hypothyroidism    Inguinal hernia without mention of obstruction or gangrene, unilateral or unspecified, (not specified as recurrent)    Internal hemorrhoids without mention of complication    Macular degeneration, wet (HCC)    Myocardial infarction (HCC)    2011    Other abnormal glucose    Personal history of other disorder of urinary system    Pneumonia    hx of 12/2019    Pulmonary embolism (HCC)    hx of    Stroke (HCC) 05/25/2005   TIA   Thrombocytopenia, unspecified (HCC)    TIA (transient ischemic attack) 2008    Family History  Problem Relation Age of Onset   Other Father        Hypotension from medication causing GI bleed   Heart failure Father    Emphysema Mother    Prostate cancer Neg Hx     Past Surgical History:  Procedure Laterality Date   CARDIAC CATHETERIZATION  05/2011   mild atherosclerosis   CYSTOSCOPY  07/12/2011   Procedure: CYSTOSCOPY;  Surgeon: Lindaann Slough, MD;  Location: [facility];   Service: Urology;  Laterality: N/A;  one hour being requested for this case   EYE SURGERY Left    injection from wet AMD (age related macular degeneration)   HERNIA REPAIR  8/11   repair of right indirect and direct hernias; incarerated umbilical hernia containing preperitoneal fat   INGUINAL HERNIA REPAIR Left 11/14/2020   Procedure: OPEN LEFT INGUINAL HERNIA REPAIR WITH MESH;  Surgeon: Luretha Murphy, MD;  Location: [facility];  Service: General;  Laterality: Left;  90 MIN   SHOULDER SURGERY  03/08/08   Left; arthroscopy   TONSILLECTOMY     as child   TRANSURETHRAL RESECTION OF BLADDER TUMOR  07/12/2011   Procedure: TRANSURETHRAL RESECTION OF BLADDER TUMOR (TURBT);  Surgeon: Lindaann Slough, MD;  Location: [facility];  Service: Urology;  Laterality: N/A;   Social History   Occupational History   Occupation: Photographer  Tobacco Use   Smoking status: Former    Packs/day: 1.50    Years: 25.00    Additional pack years: 0.00    Total pack years: 37.50    Types: Cigarettes    Quit date: 08/26/2004    Years since quitting: 18.4   Smokeless tobacco: Never  Vaping Use   Vaping Use: Never used  Substance and Sexual Activity   Alcohol use: No    Alcohol/week: 0.0 standard drinks of alcohol    Comment: Quit 2009   Drug use: Not Currently    Frequency: 2.0 times per week    Types: Marijuana    Comment: MJ-regular use  10/2013 no longer uses   Sexual activity: Yes

## 2023-01-21 ENCOUNTER — Telehealth: Payer: Self-pay | Admitting: Adult Health

## 2023-01-21 ENCOUNTER — Other Ambulatory Visit: Payer: Self-pay | Admitting: Adult Health

## 2023-01-21 DIAGNOSIS — G4700 Insomnia, unspecified: Secondary | ICD-10-CM

## 2023-01-21 MED ORDER — ZOLPIDEM TARTRATE 10 MG PO TABS: 10.0000 mg | ORAL_TABLET | Freq: Every evening | ORAL | 0 refills | Status: DC | PRN

## 2023-01-21 NOTE — Telephone Encounter (Signed)
Script sent  

## 2023-01-21 NOTE — Telephone Encounter (Signed)
Pt called reporting Zolpidem ER 12.5 mg not available. Requesting Rx for 10 mg to [facility] [address] In stock.

## 2023-02-11 ENCOUNTER — Other Ambulatory Visit: Payer: Self-pay | Admitting: Cardiovascular Disease

## 2023-02-11 DIAGNOSIS — I82409 Acute embolism and thrombosis of unspecified deep veins of unspecified lower extremity: Secondary | ICD-10-CM

## 2023-02-12 NOTE — Telephone Encounter (Signed)
Prescription refill request for Xarelto received.  Indication: DVT Last office visit: 11/05/21 overdue. Message sent. Weight: 212# Age: 72

## 2023-02-24 ENCOUNTER — Other Ambulatory Visit: Payer: Self-pay | Admitting: Adult Health

## 2023-02-24 DIAGNOSIS — G4700 Insomnia, unspecified: Secondary | ICD-10-CM

## 2023-03-21 ENCOUNTER — Other Ambulatory Visit: Payer: Self-pay | Admitting: Adult Health

## 2023-03-21 DIAGNOSIS — F411 Generalized anxiety disorder: Secondary | ICD-10-CM

## 2023-03-21 DIAGNOSIS — G4700 Insomnia, unspecified: Secondary | ICD-10-CM

## 2023-03-21 NOTE — Telephone Encounter (Signed)
Pt left message requesting Clorazepate & Zolpidem Rx. [facility] [address]   Follow up due Oct.

## 2023-04-16 NOTE — Progress Notes (Deleted)
Cardiology Clinic Note   Patient Name: [name] Date of Encounter: 04/16/2023  Primary Care Provider:  Joaquim Nam, MD Primary Cardiologist:  Thurmon Fair, MD  Patient Profile    [name] 72 year old male presents to the clinic today for follow-up evaluation of his palpitations and hyperlipidemia.  Past Medical History    Past Medical History:  Diagnosis Date   Alcohol abuse, unspecified    history of   Anxiety    Arthritis    all over   Bladder cancer [facility]) 2011   Dr. Brunilda Payor   CAD (coronary artery disease) 2012   MI   Depression    Dyslipidemia    GERD (gastroesophageal reflux disease)    History of DVT (deep vein thrombosis) 11/08 and again in 2014   previously on coumadin   Hypertension    Hypothyroidism    Inguinal hernia without mention of obstruction or gangrene, unilateral or unspecified, (not specified as recurrent)    Internal hemorrhoids without mention of complication    Macular degeneration, wet (HCC)    Myocardial infarction (HCC)    2011    Other abnormal glucose    Personal history of other disorder of urinary system    Pneumonia    hx of 12/2019    Pulmonary embolism (HCC)    hx of    Stroke (HCC) 05/25/2005   TIA   Thrombocytopenia, unspecified (HCC)    TIA (transient ischemic attack) 2008   Past Surgical History:  Procedure Laterality Date   CARDIAC CATHETERIZATION  05/2011   mild atherosclerosis   CYSTOSCOPY  07/12/2011   Procedure: CYSTOSCOPY;  Surgeon: Lindaann Slough, MD;  Location: [facility];  Service: Urology;  Laterality: N/A;  one hour being requested for this case   EYE SURGERY Left    injection from wet AMD (age related macular degeneration)   HERNIA REPAIR  8/11   repair of right indirect and direct hernias; incarerated umbilical hernia containing preperitoneal fat   INGUINAL HERNIA REPAIR Left 11/14/2020   Procedure: OPEN LEFT INGUINAL HERNIA REPAIR WITH MESH;  Surgeon: Luretha Murphy, MD;  Location: [facility];   Service: General;  Laterality: Left;  90 MIN   SHOULDER SURGERY  03/08/08   Left; arthroscopy   TONSILLECTOMY     as child   TRANSURETHRAL RESECTION OF BLADDER TUMOR  07/12/2011   Procedure: TRANSURETHRAL RESECTION OF BLADDER TUMOR (TURBT);  Surgeon: Lindaann Slough, MD;  Location: [facility];  Service: Urology;  Laterality: N/A;    Allergies  Allergies  Allergen Reactions   Crestor [Rosuvastatin]     Mood changes   Escitalopram Oxalate     Mood changes, irritablity   Latex Other (See Comments)    Blood in urine with latex catheter- but this was likely from mechanical irritation, not from the latex itself   Sulfonamide Derivatives Rash    History of Present Illness    [name] is a PMH of palpitations, chronic DVT, dyslipidemia, and anxiety/depression.  He was noted to have minor coronary artery disease by angiography 2012, Hx of TIA, and chronic peripheral venous insufficiency.  He wore a cardiac event monitor which showed sinus rhythm with normal circadian rhythm, occasional PVCs in bigeminy pattern.  He was started on metoprolol 12.5 mg.     He was seen virtually by Bailey Mech, DNP 04/26/2019.  During that time he reported he was taking his sertraline daily.  Previously he had not been as consistent with the medication.  He also reported daily compliance with his metoprolol.  He did not note significant improvement in his palpitations.  However, he did report that they were not bothering him as much.  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  He was noted to be overall improved.      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  He had his property paid off and he was on Tree surgeon.  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  He reported that his vision had been impacted in bilateral eyes and he routinely received injections in his left eye.  He denied  palpitations.  He reported that he did not follow a heart healthy diet because he lived alone and prepared his own meals.  He stated that he mainly ate dried nuts and cookies.  He continued to struggle with depression due to his vision, other health problems, and not being able to work like he would like.  He was limited in his physical activity due to his eyesight.  We reviewed his lower extremity DVT and need for Xarelto.  He asked for samples.  I ordered a direct LDL, and plan follow-up for 12 months.  He presents to the clinic today for follow-up evaluation and states***.  Today he denies chest pain, shortness of breath, lower extremity edema, fatigue, palpitations, melena, hematuria, hemoptysis, diaphoresis, weakness, presyncope, syncope, orthopnea, and PND.     Home Medications    Prior to Admission medications   Medication Sig Start Date End Date Taking? Authorizing Provider  clorazepate (TRANXENE) 7.5 MG tablet TAKE 1 TABLET(7.5 MG) BY MOUTH DAILY 09/11/21   Mozingo, Thereasa Solo, NP  HYDROcodone-acetaminophen (NORCO/VICODIN) 5-325 MG tablet Take 1 tablet by mouth every 6 (six) hours as needed for moderate pain. 11/14/20   Luretha Murphy, MD  ibuprofen (ADVIL,MOTRIN) 200 MG tablet Take 100-200 mg by mouth daily as needed for mild pain.    [provider]  levothyroxine (SYNTHROID) 25 MCG tablet Take 1 tablet (25 mcg total) by mouth daily before breakfast. 02/04/20   Lamptey, Britta Mccreedy, MD  Multiple Vitamin (MULTIVITAMIN WITH MINERALS) TABS tablet Take 1 tablet by mouth daily.    [provider]  pantoprazole (PROTONIX) 40 MG tablet TAKE 1 TABLET(40 MG) BY MOUTH EVERY MORNING 30 MINUTES BEFORE BREAKFAST 02/08/21   Esterwood, Amy S, PA-C  PROLENSA 0.07 % SOLN Place 1 drop into the right eye See admin instructions. 10/24/20   [provider]  rivaroxaban (XARELTO) 20 MG TABS tablet Take 1 tablet (20 mg total) by mouth daily with supper. 09/17/21   Croitoru, Mihai, MD   sertraline (ZOLOFT) 100 MG tablet Take 1 tablet (100 mg total) by mouth daily. 09/11/21   Mozingo, Thereasa Solo, NP  SIMBRINZA 1-0.2 % SUSP Place 1 drop into both eyes 3 (three) times daily. 10/14/20   [provider]  zolpidem (AMBIEN CR) 12.5 MG CR tablet Take 1 tablet (12.5 mg total) by mouth at bedtime. 09/11/21   Mozingo, Thereasa Solo, NP    Family History    Family History  Problem Relation Age of Onset   Other Father        Hypotension from medication causing GI bleed   Heart failure Father    Emphysema Mother    Prostate cancer Neg Hx    He indicated that his mother is deceased. He indicated that his father is deceased. He indicated  that both of his brothers are alive. He indicated that his maternal grandmother is deceased. He indicated that his maternal grandfather is deceased. He indicated that his paternal grandmother is deceased. He indicated that his paternal grandfather is deceased. He indicated that the status of his neg hx is unknown.  Social History    Social History   Socioeconomic History   Marital status: Single    Spouse name: Not on file   Number of children: 0   Years of education: Not on file   Highest education level: Not on file  Occupational History   Occupation: Photographer  Tobacco Use   Smoking status: Former    Current packs/day: 0.00    Average packs/day: 1.5 packs/day for 25.0 years (37.5 ttl pk-yrs)    Types: Cigarettes    Start date: 08/27/1979    Quit date: 08/26/2004    Years since quitting: 18.6   Smokeless tobacco: Never  Vaping Use   Vaping status: Never Used  Substance and Sexual Activity   Alcohol use: No    Alcohol/week: 0.0 standard drinks of alcohol    Comment: Quit 2009   Drug use: Not Currently    Frequency: 2.0 times per week    Types: Marijuana    Comment: MJ-regular use  10/2013 no longer uses   Sexual activity: Yes  Other Topics Concern   Not on file  Social History Narrative   Photographer   Single; lives  with male significant other   Social Determinants of Health   Financial Resource Strain: Low Risk  (10/08/2022)   Overall Financial Resource Strain (CARDIA)    Difficulty of Paying Living Expenses: Not hard at all  Food Insecurity: No Food Insecurity (10/08/2022)   Hunger Vital Sign    Worried About Running Out of Food in the Last Year: Never true    Ran Out of Food in the Last Year: Never true  Transportation Needs: No Transportation Needs (10/08/2022)   PRAPARE - Administrator, Civil Service (Medical): No    Lack of Transportation (Non-Medical): No  Physical Activity: Sufficiently Active (10/08/2022)   Exercise Vital Sign    Days of Exercise per Week: 7 days    Minutes of Exercise per Session: 30 min  Stress: Stress Concern Present (10/08/2022)   [employer] of Occupational Health - Occupational Stress Questionnaire    Feeling of Stress : To some extent  Social Connections: Socially Isolated (10/08/2022)   Social Connection and Isolation Panel [NHANES]    Frequency of Communication with Friends and Family: Twice a week    Frequency of Social Gatherings with Friends and Family: Never    Attends Religious Services: Never    Database administrator or Organizations: No    Attends Banker Meetings: Never    Marital Status: Never married  Intimate Partner Violence: Not At Risk (10/08/2022)   Humiliation, Afraid, Rape, and Kick questionnaire    Fear of Current or Ex-Partner: No    Emotionally Abused: No    Physically Abused: No    Sexually Abused: No     Review of Systems    General:  No chills, fever, night sweats or weight changes.  Cardiovascular:  No chest pain, dyspnea on exertion, edema, orthopnea, palpitations, paroxysmal nocturnal dyspnea. Dermatological: No rash, lesions/masses Respiratory: No cough, dyspnea Urologic: No hematuria, dysuria Abdominal:   No nausea, vomiting, diarrhea, bright red blood per rectum, melena, or  hematemesis Neurologic:  No visual changes, wkns,  changes in mental status. All other systems reviewed and are otherwise negative except as noted above.  Physical Exam    VS:  There were no vitals taken for this visit. , BMI There is no height or weight on file to calculate BMI. GEN: Well nourished, well developed, in no acute distress. HEENT: normal. Neck: Supple, no JVD, carotid bruits, or masses. Cardiac: RRR, no murmurs, rubs, or gallops. No clubbing, cyanosis, edema.  Radials/DP/PT 2+ and equal bilaterally.  Respiratory:  Respirations regular and unlabored, clear to auscultation bilaterally. GI: Soft, nontender, nondistended, BS + x 4. MS: no deformity or atrophy. Skin: warm and dry, no rash. Neuro:  Strength and sensation are intact. Psych: Normal affect.  Accessory Clinical Findings    Recent Labs: No results found for requested labs within last 365 days.   Recent Lipid Panel    Component Value Date/Time   CHOL 174 01/03/2020 0910   TRIG 93.0 01/03/2020 0910   HDL 39.90 01/03/2020 0910   CHOLHDL 4 01/03/2020 0910   VLDL 18.6 01/03/2020 0910   LDLCALC 116 (H) 01/03/2020 0910   LDLDIRECT 137 (H) 11/05/2021 1010   LDLDIRECT 158.7 08/29/2008 1235    ECG personally reviewed by me today-***  EKG 11/06/2022 sinus bradycardia with nonspecific intraventricular conduction delay 53 bpm- No acute changes  Cardiac event monitor 03/17/2019  Dominant rhythm is normal sinus rhythm with normal circvadian rhythm. Occasional monomorphic PVCs are seen, often in a pattern of bigeminy. No evidence of ventricular tachycardia or atrial fibrillation. Symptoms appear to coincide with periods of ventricular bigeminy.   Occasional symptomatic PVCs in a pattern of bigeminy. Otherwise normal arrhythmia monitor.  Assessment & Plan   1. Dyslipidemia-LDL 11***6 01/03/20.  Heart healthy low-sodium high-fiber diet-reviewed Increase physical activity as tolerated-goal 150 minutes of moderate  physical activity per week. Order lipids and LFTs  Lower extremity DVT-compliant with DOAC.  Denies bleeding issues.  History of lower extremity DVT-lifelong anticoagulation Continue Xarelto  Chronic depression with anxiety-very pleasant today.  Reports compliance with sertraline.  Continues to struggle with depression. Continue sertraline Mindfulness stress reduction sheet reviewed Follows with PCP  Disposition: Follow-up with Dr. Royann Shivers or me in 12 months.   Thomasene Ripple. Luken Shadowens NP-C    04/16/2023, 3:05 PM [facility] [address] Office [phone] Fax [phone]  Notice: This dictation was prepared with Dragon dictation along with smaller phrase technology. Any transcriptional errors that result from this process are unintentional and may not be corrected upon review.  I spent 14***minutes examining this patient, reviewing medications, and using patient centered shared decision making involving her cardiac care.  Prior to her visit I spent greater than 20 minutes reviewing her past medical history,  medications, and prior cardiac tests.

## 2023-04-17 ENCOUNTER — Ambulatory Visit: Payer: Medicare Other | Attending: General Practice | Admitting: General Practice

## 2023-04-17 ENCOUNTER — Encounter: Payer: Self-pay | Admitting: General Practice

## 2023-04-23 ENCOUNTER — Encounter: Payer: Self-pay | Admitting: Family

## 2023-04-23 ENCOUNTER — Ambulatory Visit (INDEPENDENT_AMBULATORY_CARE_PROVIDER_SITE_OTHER): Payer: Medicare Other | Admitting: Family

## 2023-04-23 VITALS — BP 138/84 | HR 76 | Temp 98.4°F | Ht 74.0 in | Wt 207.2 lb

## 2023-04-23 DIAGNOSIS — I82409 Acute embolism and thrombosis of unspecified deep veins of unspecified lower extremity: Secondary | ICD-10-CM

## 2023-04-23 DIAGNOSIS — R1030 Lower abdominal pain, unspecified: Secondary | ICD-10-CM

## 2023-04-23 DIAGNOSIS — R109 Unspecified abdominal pain: Secondary | ICD-10-CM | POA: Insufficient documentation

## 2023-04-23 NOTE — Patient Instructions (Addendum)
As discussed, I am concerned that you may have gastritis; we also discussed diverticulitis which is a much more concerning infection.    If abdominal pain persists , worsens or you develop recurrence of fever, please let me know right away as I would recommend as we discussed a CT of the abdomen.  I have ordered urinalysis labs and stool studies.  Please return stool studies as discussed [facility].  I will likely advise imodium AFTER I results of stool tests.   Please follow a bland diet and advance as tolerated.

## 2023-04-23 NOTE — Assessment & Plan Note (Signed)
No abdominal pain during exam. Reported tactile fever. Nonbloody diarrhea. Concern for gastritis. Pending GI pathogen panel,  CDiff screen , UA,  labs.  Known diverticulitis.  Patient declines CT abdomen/pelvis and strict return precautions given to patient if pain were to intensify, persist or he develop fever again that I would strongly advise imaging d/t risk of complications with diverticulitis.  If stool studies are negative for infectious etiology, question if IBS related cramping. Advised bland diet and to let me know how he is doing.

## 2023-04-23 NOTE — Progress Notes (Signed)
Assessment & Plan:  Lower abdominal pain Assessment & Plan: No abdominal pain during exam. Reported tactile fever. Nonbloody diarrhea. Concern for gastritis. Pending GI pathogen panel,  CDiff screen , UA,  labs.  Known diverticulitis.  Patient declines CT abdomen/pelvis and strict return precautions given to patient if pain were to intensify, persist or he develop fever again that I would strongly advise imaging d/t risk of complications with diverticulitis.  If stool studies are negative for infectious etiology, question if IBS related cramping. Advised bland diet and to let me know how he is doing.   Orders: -     CBC with Differential/Platelet -     Comprehensive metabolic panel -     TSH -     Urinalysis, Routine w reflex microscopic -     Lipase -     Amylase -     GI pathogen panel by PCR, stool; Future -     C Difficile Quick Screen w PCR reflex; Future -     Urinalysis, Routine w reflex microscopic; Future     Return precautions given.   Risks, benefits, and alternatives of the medications and treatment plan prescribed today were discussed, and patient expressed understanding.   Education regarding symptom management and diagnosis given to patient on AVS either electronically or printed.  No follow-ups on file.  Rennie Plowman, FNP  Subjective:    Patient ID: [name], male    DOB: 04-07-1951, 72 y.o.   MRN: [id number]  CC: [name] is a 72 y.o. male who presents today for an acute visit.    HPI: Complains of lower stomach pain 3 days ago, comes and goes.  Watery brown diarrhea 3-4 times per day. No blood. Decreased appetite , feels more sweaty.   No recent abx. He had a tactile fever 4 days ago, since resolved.   Cannot tell if there is a pattern , standing or walking  No abdominal  pain at this time. Comes and goes and every 5 minutes. Hasn't tried any medication.   He did yardwork last week and feels 'band of muscles' left side.   No numbness  in groin, dysuria, hematuria, vomiting.   No sick contacts.  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 Repeat in 10 years; EDG showed esophagitis  Compliant with protonix 40mg  every day. No epigastric pain, belching.      He follows with psychiatry last seen 12/12/2022.  Decreased sertraline 50 mg  History of hypothyroidism, alcohol abuse, bladder cancer, CAD, DVT 2008 and again 2014.  He is compliant with Xarelto 20 mg every day  Last seen by cardiology 11/05/2021 for dyslipidemia, Allergies: Crestor [rosuvastatin], Escitalopram oxalate, Latex, and Sulfonamide derivatives Current Outpatient Medications on File Prior to Visit  Medication Sig Dispense Refill   clorazepate (TRANXENE) 7.5 MG tablet TAKE 1 TABLET(7.5 MG) BY MOUTH DAILY 30 tablet 2   ibuprofen (ADVIL,MOTRIN) 200 MG tablet Take 100-200 mg by mouth daily as needed for mild pain.     levothyroxine (SYNTHROID) 25 MCG tablet Take 1 tablet (25 mcg total) by mouth daily before breakfast. 90 tablet 1   Multiple Vitamin (MULTIVITAMIN WITH MINERALS) TABS tablet Take 1 tablet by mouth daily.     pantoprazole (PROTONIX) 40 MG tablet TAKE 1 TABLET(40 MG) BY MOUTH EVERY MORNING 30 MINUTES BEFORE BREAKFAST 30 tablet 6   PROLENSA 0.07 % SOLN  Place 1 drop into the right eye See admin instructions.     rivaroxaban (XARELTO) 20 MG TABS tablet TAKE 1 TABLET BY MOUTH EVERY DAY WITH SUPPER 30 tablet 0   sertraline (ZOLOFT) 100 MG tablet Take 0.5 tablets (50 mg total) by mouth daily.     SIMBRINZA 1-0.2 % SUSP Place 1 drop into both eyes 3 (three) times daily.     zolpidem (AMBIEN CR) 12.5 MG CR tablet TAKE 1 TABLET(12.5 MG) BY MOUTH AT BEDTIME 30 tablet 2   zolpidem (AMBIEN) 10 MG tablet TAKE 1 TABLET BY MOUTH EVERY DAY AT BEDTIME AS NEEDED FOR SLEEP 30 tablet 0   No current facility-administered medications on file prior to visit.     Review of Systems  Constitutional:  Positive for appetite change and fatigue. Negative for chills and fever.  Respiratory:  Negative for cough.   Cardiovascular:  Negative for chest pain and palpitations.  Gastrointestinal:  Positive for abdominal pain and diarrhea. Negative for blood in stool, constipation, nausea and vomiting.      Objective:    BP 138/84   Pulse 76   Temp 98.4 F (36.9 C) (Oral)   Ht 6\' 2"  (1.88 m)   Wt 207 lb 3.2 oz (94 kg)   SpO2 96%   BMI 26.60 kg/m   BP Readings from Last 3 Encounters:  04/23/23 138/84  11/01/22 110/62  09/16/22 110/80   Wt Readings from Last 3 Encounters:  04/23/23 207 lb 3.2 oz (94 kg)  11/01/22 212 lb (96.2 kg)  10/08/22 205 lb (93 kg)    Physical Exam Vitals reviewed.  Constitutional:      Appearance: Normal appearance. He is well-developed.  Cardiovascular:     Rate and Rhythm: Regular rhythm.     Heart sounds: Normal heart sounds.  Pulmonary:     Effort: Pulmonary effort is normal. No respiratory distress.     Breath sounds: Normal breath sounds. No wheezing or rales.  Abdominal:     General: Bowel sounds are normal. There is no distension.     Palpations: Abdomen is soft. Abdomen is not rigid. There is no fluid wave or mass.     Tenderness: There is no abdominal tenderness. There is no guarding or rebound. Negative signs include Murphy's sign and McBurney's sign.     Comments: No suprapubic tenderness.   Skin:    General: Skin is warm and dry.  Neurological:     Mental Status: He is alert.  Psychiatric:        Speech: Speech normal.        Behavior: Behavior normal.

## 2023-04-24 LAB — COMPREHENSIVE METABOLIC PANEL
ALT: 26 U/L (ref 0–53)
AST: 29 U/L (ref 0–37)
Albumin: 4 g/dL (ref 3.5–5.2)
Alkaline Phosphatase: 45 U/L (ref 39–117)
BUN: 18 mg/dL (ref 6–23)
CO2: 22 meq/L (ref 19–32)
Calcium: 9 mg/dL (ref 8.4–10.5)
Chloride: 105 meq/L (ref 96–112)
Creatinine, Ser: 0.97 mg/dL (ref 0.40–1.50)
GFR: 78.29 mL/min (ref >60.00)
Glucose, Bld: 97 mg/dL (ref 70–99)
Potassium: 4 meq/L (ref 3.5–5.1)
Sodium: 134 mEq/L — ABNORMAL LOW (ref 135–145)
Total Bilirubin: 0.6 mg/dL (ref 0.2–1.2)
Total Protein: 7.5 g/dL (ref 6.0–8.3)

## 2023-04-24 LAB — CBC WITH DIFFERENTIAL/PLATELET
Basophils Absolute: 0 10*3/uL (ref 0.0–0.1)
Basophils Relative: 0.9 % (ref 0.0–3.0)
Eosinophils Absolute: 0 10*3/uL (ref 0.0–0.7)
Eosinophils Relative: 0.8 % (ref 0.0–5.0)
HCT: 50.8 % (ref 39.0–52.0)
Hemoglobin: 17.2 g/dL — ABNORMAL HIGH (ref 13.0–17.0)
Lymphocytes Relative: 23.4 % (ref 12.0–46.0)
Lymphs Abs: 1 10*3/uL (ref 0.7–4.0)
MCHC: 33.8 g/dL (ref 30.0–36.0)
MCV: 96.3 fl (ref 78.0–100.0)
Monocytes Absolute: 0.7 10*3/uL (ref 0.1–1.0)
Monocytes Relative: 15.2 % — ABNORMAL HIGH (ref 3.0–12.0)
Neutro Abs: 2.6 10*3/uL (ref 1.4–7.7)
Neutrophils Relative %: 59.7 % (ref 43.0–77.0)
Platelets: 149 10*3/uL — ABNORMAL LOW (ref 150.0–400.0)
RBC: 5.27 Mil/uL (ref 4.22–5.81)
RDW: 13.7 % (ref 11.5–15.5)
WBC: 4.4 10*3/uL (ref 4.0–10.5)

## 2023-04-24 LAB — TSH: TSH: 3.14 u[IU]/mL (ref 0.35–5.50)

## 2023-04-24 LAB — AMYLASE: Amylase: 39 U/L (ref 27–131)

## 2023-04-24 LAB — LIPASE: Lipase: 30 U/L (ref 11.0–59.0)

## 2023-04-24 NOTE — Addendum Note (Signed)
Addended by: Jarvis Morgan D on: 04/24/2023 09:54 AM   Modules accepted: Orders

## 2023-04-26 ENCOUNTER — Other Ambulatory Visit: Payer: Self-pay | Admitting: Adult Health

## 2023-04-26 DIAGNOSIS — G4700 Insomnia, unspecified: Secondary | ICD-10-CM

## 2023-04-30 ENCOUNTER — Telehealth: Payer: Self-pay

## 2023-04-30 NOTE — Telephone Encounter (Signed)
LVM to call back and schedule lab appt also

## 2023-04-30 NOTE — Addendum Note (Signed)
Addended by: [place], Jaylyn Iyer on: 04/30/2023 04:32 PM   Modules accepted: Orders

## 2023-05-02 ENCOUNTER — Telehealth: Payer: Self-pay

## 2023-05-02 NOTE — Telephone Encounter (Signed)
-----   Message from Rennie Plowman sent at 05/02/2023 12:05 PM EDT ----- Call patient Patient has not viewed MyChart result note.  Please review my chart note in detail with patient.   Please let me know if questions

## 2023-05-02 NOTE — Telephone Encounter (Signed)
Lvm to,call back to go over results      Girard,   I hope that you are feeling better.  I have still not received stool or urine studies.  Please let me know if you have return these.   Overall labs are stable.   Your monocytes are slightly elevated.  I like to repeat this in 6 weeks.  Please call the office to schedule this nonfasting lab   Regards, Claris Che

## 2023-05-06 ENCOUNTER — Telehealth: Payer: Self-pay

## 2023-05-06 NOTE — Telephone Encounter (Signed)
LVM to call back to go over results and schedule appt lab orders are in

## 2023-05-29 ENCOUNTER — Telehealth: Payer: Self-pay | Admitting: Cardiovascular Disease

## 2023-05-29 NOTE — Telephone Encounter (Signed)
*  STAT* If patient is at the pharmacy, call can be transferred to refill team.   1. Which medications need to be refilled? (please list name of each medication and dose if known) rivaroxaban (XARELTO) 20 MG TABS tablet   2. Which pharmacy/location (including street and city if local pharmacy) is medication to be sent to?  [id number] [place], [place] - [address]      3. Do they need a 30 day or 90 day supply? 90 day    Pt is out of medication

## 2023-05-30 ENCOUNTER — Other Ambulatory Visit: Payer: Self-pay

## 2023-05-30 DIAGNOSIS — I82409 Acute embolism and thrombosis of unspecified deep veins of unspecified lower extremity: Secondary | ICD-10-CM

## 2023-05-30 MED ORDER — RIVAROXABAN 20 MG PO TABS: 20.0000 mg | ORAL_TABLET | Freq: Every day | ORAL | 1 refills | Status: DC

## 2023-05-30 NOTE — Telephone Encounter (Signed)
Prescription refill request for Xarelto received.  Indication:dvt Last office visit:upcoming Weight:94  kg Age:72 Scr:0.97  8/24 CrCl:91.52  ml/min  Prescription refilled

## 2023-06-27 NOTE — Progress Notes (Unsigned)
Cardiology Clinic Note   Patient Name: [name] Date of Encounter: 07/01/2023  Primary Care Provider:  Joaquim Nam, MD Primary Cardiologist:  Thurmon Fair, MD  Patient Profile    [name] 72 year old male presents to the clinic today for follow-up evaluation of his palpitations  Past Medical History    Past Medical History:  Diagnosis Date   Alcohol abuse, unspecified    history of   Anxiety    Arthritis    all over   Bladder cancer [facility]) 2011   Dr. Brunilda Payor   CAD (coronary artery disease) 2012   MI   Depression    Dyslipidemia    GERD (gastroesophageal reflux disease)    History of DVT (deep vein thrombosis) 11/08 and again in 2014   previously on coumadin   Hypertension    Hypothyroidism    Inguinal hernia without mention of obstruction or gangrene, unilateral or unspecified, (not specified as recurrent)    Internal hemorrhoids without mention of complication    Macular degeneration, wet (HCC)    Myocardial infarction (HCC)    2011    Other abnormal glucose    Personal history of other disorder of urinary system    Pneumonia    hx of 12/2019    Pulmonary embolism (HCC)    hx of    Stroke (HCC) 05/25/2005   TIA   Thrombocytopenia, unspecified (HCC)    TIA (transient ischemic attack) 2008   Past Surgical History:  Procedure Laterality Date   CARDIAC CATHETERIZATION  05/2011   mild atherosclerosis   CYSTOSCOPY  07/12/2011   Procedure: CYSTOSCOPY;  Surgeon: Lindaann Slough, MD;  Location: [facility];  Service: Urology;  Laterality: N/A;  one hour being requested for this case   EYE SURGERY Left    injection from wet AMD (age related macular degeneration)   HERNIA REPAIR  8/11   repair of right indirect and direct hernias; incarerated umbilical hernia containing preperitoneal fat   INGUINAL HERNIA REPAIR Left 11/14/2020   Procedure: OPEN LEFT INGUINAL HERNIA REPAIR WITH MESH;  Surgeon: Luretha Murphy, MD;  Location: [facility];  Service: General;   Laterality: Left;  90 MIN   SHOULDER SURGERY  03/08/08   Left; arthroscopy   TONSILLECTOMY     as child   TRANSURETHRAL RESECTION OF BLADDER TUMOR  07/12/2011   Procedure: TRANSURETHRAL RESECTION OF BLADDER TUMOR (TURBT);  Surgeon: Lindaann Slough, MD;  Location: [facility];  Service: Urology;  Laterality: N/A;    Allergies  Allergies  Allergen Reactions   Crestor [Rosuvastatin]     Mood changes   Escitalopram Oxalate     Mood changes, irritablity   Latex Other (See Comments)    Blood in urine with latex catheter- but this was likely from mechanical irritation, not from the latex itself   Sulfonamide Derivatives Rash    History of Present Illness    [name] is a PMH of palpitations, chronic DVT, dyslipidemia, and anxiety/depression.  He was noted to have minor coronary artery disease by angiography 2012, Hx of TIA, and chronic peripheral venous insufficiency.  He wore a cardiac event monitor which showed sinus rhythm with normal circadian rhythm, occasional PVCs in bigeminy pattern.  He was started on metoprolol 12.5 mg.  He was seen virtually by Bailey Mech, DNP 04/26/2019.  During that time he reported he was taking his sertraline daily.  Previously he had not been as consistent with the medication.  He also reported daily compliance  with his metoprolol.  He did not note significant improvement in his palpitations.  However, he did report that they were not bothering him as much.  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  He was noted to be overall improved.  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  He reported that his vision had been impacted in bilateral eyes and he routinely received injections in his left eye.  He denied palpitations.  He reported that he did not follow a heart healthy diet because he lived  alone and prepared his own meals.  He stated that he mainly ate dried nuts and cookies.  He continued to struggle with depression due to his vision, other health problems, and not being able to work like he would like.  He was limited in his physical activity due to his eyesight.  We reviewed his lower extremity DVT and need for Xarelto.  He asked for samples.  I ordered a direct LDL, gave a salty 6 diet sheet, had him increase his physical activity , and planned follow-up for 12 months.  He presents to the clinic today for follow-up evaluation and states he continues to struggle with his glaucoma.  He continues to be challenged by his depression and anxiety.  He is frustrated that piano tuning is becoming much less common.  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  He has been having trouble with his driving related to his vision.  We reviewed his history of DVT and prior cardiology visits.  He remains stable from a cardiac standpoint.  I will order fasting lipids and LFTs and plan follow-up in 12 months.  Today he denies chest pain, shortness of breath, lower extremity edema, fatigue, palpitations, melena, hematuria, hemoptysis, diaphoresis, weakness, presyncope, syncope, orthopnea, and PND.     Home Medications    Prior to Admission medications   Medication Sig Start Date End Date Taking? Authorizing Provider  clorazepate (TRANXENE) 7.5 MG tablet TAKE 1 TABLET(7.5 MG) BY MOUTH DAILY 09/11/21   Mozingo, Thereasa Solo, NP  HYDROcodone-acetaminophen (NORCO/VICODIN) 5-325 MG tablet Take 1 tablet by mouth every 6 (six) hours as needed for moderate pain. 11/14/20   Luretha Murphy, MD  ibuprofen (ADVIL,MOTRIN) 200 MG tablet Take 100-200 mg by mouth daily as needed for mild pain.    [provider]  levothyroxine (SYNTHROID) 25 MCG tablet Take 1 tablet (25 mcg total) by mouth daily before breakfast. 02/04/20   Lamptey, Britta Mccreedy, MD  Multiple Vitamin  (MULTIVITAMIN WITH MINERALS) TABS tablet Take 1 tablet by mouth daily.    [provider]  pantoprazole (PROTONIX) 40 MG tablet TAKE 1 TABLET(40 MG) BY MOUTH EVERY MORNING 30 MINUTES BEFORE BREAKFAST 02/08/21   Esterwood, Amy S, PA-C  PROLENSA 0.07 % SOLN Place 1 drop into the right eye See admin instructions. 10/24/20   [provider]  rivaroxaban (XARELTO) 20 MG TABS tablet Take 1 tablet (20 mg total) by mouth daily with supper. 09/17/21   Croitoru, Mihai, MD  sertraline (ZOLOFT) 100 MG tablet Take 1 tablet (100 mg total) by mouth daily. 09/11/21   Mozingo, Thereasa Solo, NP  SIMBRINZA 1-0.2 % SUSP Place 1 drop into both eyes 3 (three) times daily. 10/14/20   [provider]  zolpidem (AMBIEN CR) 12.5 MG CR tablet Take 1 tablet (12.5 mg  total) by mouth at bedtime. 09/11/21   Mozingo, Thereasa Solo, NP    Family History    Family History  Problem Relation Age of Onset   Other Father        Hypotension from medication causing GI bleed   Heart failure Father    Emphysema Mother    Prostate cancer Neg Hx    He indicated that his mother is deceased. He indicated that his father is deceased. He indicated that both of his brothers are alive. He indicated that his maternal grandmother is deceased. He indicated that his maternal grandfather is deceased. He indicated that his paternal grandmother is deceased. He indicated that his paternal grandfather is deceased. He indicated that the status of his neg hx is unknown.  Social History    Social History   Socioeconomic History   Marital status: Single    Spouse name: Not on file   Number of children: 0   Years of education: Not on file   Highest education level: Not on file  Occupational History   Occupation: Photographer  Tobacco Use   Smoking status: Former    Current packs/day: 0.00    Average packs/day: 1.5 packs/day for 25.0 years (37.5 ttl pk-yrs)    Types: Cigarettes    Start date: 08/27/1979    Quit date:  08/26/2004    Years since quitting: 18.8   Smokeless tobacco: Never  Vaping Use   Vaping status: Never Used  Substance and Sexual Activity   Alcohol use: No    Alcohol/week: 0.0 standard drinks of alcohol    Comment: Quit 2009   Drug use: Not Currently    Frequency: 2.0 times per week    Types: Marijuana    Comment: MJ-regular use  10/2013 no longer uses   Sexual activity: Yes  Other Topics Concern   Not on file  Social History Narrative   Photographer   Single; 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   Social Determinants of Health   Financial Resource Strain: Low Risk  (10/08/2022)   Overall Financial Resource Strain (CARDIA)    Difficulty of Paying Living Expenses: Not hard at all  Food Insecurity: No Food Insecurity (10/08/2022)   Hunger Vital Sign    Worried About Running Out of Food in the Last Year: Never true    Ran Out of Food in the Last Year: Never true  Transportation Needs: No Transportation Needs (10/08/2022)   PRAPARE - Administrator, Civil Service (Medical): No    Lack of Transportation (Non-Medical): No  Physical Activity: Sufficiently Active (10/08/2022)   Exercise Vital Sign    Days of Exercise per Week: 7 days    Minutes of Exercise per Session: 30 min  Stress: Stress Concern Present (10/08/2022)   [employer] of Occupational Health - Occupational Stress Questionnaire    Feeling of Stress : To some extent  Social Connections: Socially Isolated (10/08/2022)   Social Connection and Isolation Panel [NHANES]    Frequency of Communication with Friends and Family: Twice a week    Frequency of Social Gatherings with Friends and Family: Never    Attends Religious Services: Never    Database administrator or Organizations: No    Attends Banker Meetings: Never    Marital Status: Never married  Intimate Partner Violence: Not At Risk (10/08/2022)   Humiliation, Afraid, Rape, and Kick questionnaire    Fear of Current or Ex-Partner: No     Emotionally Abused:  No    Physically Abused: No    Sexually Abused: No     Review of Systems    General:  No chills, fever, night sweats or weight changes.  Cardiovascular:  No chest pain, dyspnea on exertion, edema, orthopnea, palpitations, paroxysmal nocturnal dyspnea. Dermatological: No rash, lesions/masses Respiratory: No cough, dyspnea Urologic: No hematuria, dysuria Abdominal:   No nausea, vomiting, diarrhea, bright red blood per rectum, melena, or hematemesis Neurologic:  No visual changes, wkns, changes in mental status. All other systems reviewed and are otherwise negative except as noted above.  Physical Exam    VS:  BP 130/84 (BP Location: Left Arm, Patient Position: Sitting, Cuff Size: Normal)   Pulse (!) 57   Ht 6\' 2"  (1.88 m)   Wt 213 lb (96.6 kg)   SpO2 95%   BMI 27.35 kg/m  , BMI Body mass index is 27.35 kg/m. GEN: Well nourished, well developed, in no acute distress. HEENT: normal. Neck: Supple, no JVD, carotid bruits, or masses. Cardiac: RRR, no murmurs, rubs, or gallops. No clubbing, cyanosis, edema.  Radials/DP/PT 2+ and equal bilaterally.  Respiratory:  Respirations regular and unlabored, clear to auscultation bilaterally. GI: Soft, nontender, nondistended, BS + x 4. MS: no deformity or atrophy. Skin: warm and dry, no rash. Neuro:  Strength and sensation are intact. Psych: Normal affect.  Accessory Clinical Findings    Recent Labs: 04/23/2023: ALT 26; BUN 18; Creatinine, Ser 0.97; Hemoglobin 17.2; Platelets 149.0; Potassium 4.0; Sodium 134; TSH 3.14   Recent Lipid Panel    Component Value Date/Time   CHOL 174 01/03/2020 0910   TRIG 93.0 01/03/2020 0910   HDL 39.90 01/03/2020 0910   CHOLHDL 4 01/03/2020 0910   VLDL 18.6 01/03/2020 0910   LDLCALC 116 (H) 01/03/2020 0910   LDLDIRECT 137 (H) 11/05/2021 1010   LDLDIRECT 158.7 08/29/2008 1235    ECG personally reviewed by me today-EKG Interpretation Date/Time:  Tuesday July 01 2023 09:26:37  EST Ventricular Rate:  57 PR Interval:  160 QRS Duration:  114 QT Interval:  436 QTC Calculation: 424 R Axis:   -72  Text Interpretation: Sinus bradycardia with sinus arrhythmia Left axis deviation Incomplete right bundle branch block No significant change was found Confirmed by Edd Fabian [id number]) on 07/01/2023 9:35:20 AM   EKG 11/05/2021 sinus bradycardia with nonspecific intraventricular conduction delay 53 bpm- No acute changes  Cardiac event monitor 03/17/2019  Dominant rhythm is normal sinus rhythm with normal circvadian rhythm. Occasional monomorphic PVCs are seen, often in a pattern of bigeminy. No evidence of ventricular tachycardia or atrial fibrillation. Symptoms appear to coincide with periods of ventricular bigeminy.   Occasional symptomatic PVCs in a pattern of bigeminy. Otherwise normal arrhythmia monitor.  Assessment & Plan   1. Dyslipidemia-direct LDL 137 on 11/05/2021. Continue high-fiber diet Increase physical activity as tolerated-goal 150 minutes of moderate physical activity per week Repeat fasting lipids and LFTs  Lower extremity DVT-continues to be compliant with Xarelto.  No bleeding issues.  History of lower extremity DVT-lifelong anticoagulation Continue Xarelto Continue to monitor for bleeding  Chronic depression with anxiety-pleasant today.  Appears cheerful. Continues to manage depression and anxiety.  Compliant with sertraline.   Continue sertraline Mindfulness stress reduction sheet-reviewed Increase physical activity as tolerated Follows with PCP  Palpitations-denies palpitations today.  EKG shows sinus bradycardia with sinus arrhythmia 57 bpm Continue to monitor  Disposition: Follow-up with Dr. Royann Shivers or me in 12 months.   Thomasene Ripple. Illya Gienger NP-C    07/01/2023, 9:35 AM  [facility] [address] Office [phone] Fax [phone]  Notice: This dictation was prepared with Dragon dictation  along with smaller phrase technology. Any transcriptional errors that result from this process are unintentional and may not be corrected upon review.  I spent 15 minutes examining this patient, reviewing medications, and using patient centered shared decision making involving her cardiac care.  Prior to her visit I spent greater than 20 minutes reviewing her past medical history,  medications, and prior cardiac tests.

## 2023-06-28 ENCOUNTER — Other Ambulatory Visit: Payer: Self-pay | Admitting: Adult Health

## 2023-06-28 DIAGNOSIS — F411 Generalized anxiety disorder: Secondary | ICD-10-CM

## 2023-06-28 DIAGNOSIS — G4700 Insomnia, unspecified: Secondary | ICD-10-CM

## 2023-06-29 NOTE — Telephone Encounter (Signed)
Please call to schedule FU, was due last month.

## 2023-06-30 DIAGNOSIS — K080 Exfoliation of teeth due to systemic causes: Secondary | ICD-10-CM | POA: Diagnosis not present

## 2023-06-30 NOTE — Telephone Encounter (Signed)
Pt called at 4:28 and scheduled appt for 11/11

## 2023-06-30 NOTE — Telephone Encounter (Signed)
Lvm for pt to call and schedule

## 2023-07-01 ENCOUNTER — Encounter: Payer: Self-pay | Admitting: General Practice

## 2023-07-01 ENCOUNTER — Ambulatory Visit: Payer: Medicare Other | Attending: General Practice | Admitting: General Practice

## 2023-07-01 VITALS — BP 130/84 | HR 57 | Ht 74.0 in | Wt 213.0 lb

## 2023-07-01 DIAGNOSIS — E7800 Pure hypercholesterolemia, unspecified: Secondary | ICD-10-CM | POA: Diagnosis not present

## 2023-07-01 DIAGNOSIS — F32A Depression, unspecified: Secondary | ICD-10-CM

## 2023-07-01 DIAGNOSIS — Z86718 Personal history of other venous thrombosis and embolism: Secondary | ICD-10-CM | POA: Diagnosis not present

## 2023-07-01 DIAGNOSIS — R002 Palpitations: Secondary | ICD-10-CM | POA: Diagnosis not present

## 2023-07-01 DIAGNOSIS — F419 Anxiety disorder, unspecified: Secondary | ICD-10-CM | POA: Diagnosis not present

## 2023-07-01 NOTE — Patient Instructions (Signed)
Medication Instructions:  The current medical regimen is effective;  continue present plan and medications as directed. Please refer to the Current Medication list given to you today.  *If you need a refill on your cardiac medications before your next appointment, please call your pharmacy*  Lab Work: LIPID AND LFT TODAY If you have labs (blood work) drawn today and your tests are completely normal, you will receive your results only by:  MyChart Message (if you have MyChart) OR  A paper copy in the mail If you have any lab test that is abnormal or we need to change your treatment, we will call you to review the results.  Other Instructions PLEASE READ AND FOLLOW HEART HEALTHY DIET-ATTACHED  Follow-Up: At [facility], you and your health needs are our priority.  As part of our continuing mission to provide you with exceptional heart care, we have created designated Provider Care Teams.  These Care Teams include your primary Cardiologist (physician) and Advanced Practice Providers (APPs -  Physician Assistants and Nurse Practitioners) who all work together to provide you with the care you need, when you need it.  We recommend signing up for the patient portal called "MyChart".  Sign up information is provided on this After Visit Summary.  MyChart is used to connect with patients for Virtual Visits (Telemedicine).  Patients are able to view lab/test results, encounter notes, upcoming appointments, etc.  Non-urgent messages can be sent to your provider as well.   To learn more about what you can do with MyChart, go to [email or web address].    Your next appointment:   12 month(s)  Provider:   Thurmon Fair, MD  or Edd Fabian, FNP-C         Heart-Healthy Eating Plan Eating a healthy diet is important for the health of your heart. A heart-healthy eating plan includes: Eating less unhealthy fats. Eating more healthy fats. Eating less salt in your food. Salt is also called  sodium. Making other changes in your diet. Talk with your doctor or a diet specialist (dietitian) to create an eating plan that is right for you. What is my plan? Your doctor may recommend an eating plan that includes: Total fat: ______% or less of total calories a day. Saturated fat: ______% or less of total calories a day. Cholesterol: less than _________mg a day. Sodium: less than _________mg a day. What are tips for following this plan? Cooking Avoid frying your food. Try to bake, boil, grill, or broil it instead. You can also reduce fat by: Removing the skin from poultry. Removing all visible fats from meats. Steaming vegetables in water or broth. Meal planning  At meals, divide your plate into four equal parts: Fill one-half of your plate with vegetables and green salads. Fill one-fourth of your plate with whole grains. Fill one-fourth of your plate with lean protein foods. Eat 2-4 cups of vegetables per day. One cup of vegetables is: 1 cup (91 g) broccoli or cauliflower florets. 2 medium carrots. 1 large bell pepper. 1 large sweet potato. 1 large tomato. 1 medium white potato. 2 cups (150 g) raw leafy greens. Eat 1-2 cups of fruit per day. One cup of fruit is: 1 small apple 1 large banana 1 cup (237 g) mixed fruit, 1 large orange,  cup (82 g) dried fruit, 1 cup (240 mL) 100% fruit juice. Eat more foods that have soluble fiber. These are apples, broccoli, carrots, beans, peas, and barley. Try to get 20-30 g of fiber per  day. Eat 4-5 servings of nuts, legumes, and seeds per week: 1 serving of dried beans or legumes equals  cup (90 g) cooked. 1 serving of nuts is  oz (12 almonds, 24 pistachios, or 7 walnut halves). 1 serving of seeds equals  oz (8 g). General information Eat more home-cooked food. Eat less restaurant, buffet, and fast food. Limit or avoid alcohol. Limit foods that are high in starch and sugar. Avoid fried foods. Lose weight if you are  overweight. Keep track of how much salt (sodium) you eat. This is important if you have high blood pressure. Ask your doctor to tell you more about this. Try to add vegetarian meals each week. Fats Choose healthy fats. These include olive oil and canola oil, flaxseeds, walnuts, almonds, and seeds. Eat more omega-3 fats. These include salmon, mackerel, sardines, tuna, flaxseed oil, and ground flaxseeds. Try to eat fish at least 2 times each week. Check food labels. Avoid foods with trans fats or high amounts of saturated fat. Limit saturated fats. These are often found in animal products, such as meats, butter, and cream. These are also found in plant foods, such as palm oil, palm kernel oil, and coconut oil. Avoid foods with partially hydrogenated oils in them. These have trans fats. Examples are stick margarine, some tub margarines, cookies, crackers, and other baked goods. What foods should I eat? Fruits All fresh, canned (in natural juice), or frozen fruits. Vegetables Fresh or frozen vegetables (raw, steamed, roasted, or grilled). Green salads. Grains Most grains. Choose whole wheat and whole grains most of the time. Rice and pasta, including brown rice and pastas made with whole wheat. Meats and other proteins Lean, well-trimmed beef, veal, pork, and lamb. Chicken and [place] without skin. All fish and shellfish. Wild duck, rabbit, pheasant, and venison. Egg whites or low-cholesterol egg substitutes. Dried beans, peas, lentils, and tofu. Seeds and most nuts. Dairy Low-fat or nonfat cheeses, including ricotta and mozzarella. Skim or 1% milk that is liquid, powdered, or evaporated. Buttermilk that is made with low-fat milk. Nonfat or low-fat yogurt. Fats and oils Non-hydrogenated (trans-free) margarines. Vegetable oils, including soybean, sesame, sunflower, olive, peanut, safflower, corn, canola, and cottonseed. Salad dressings or mayonnaise made with a vegetable oil. Beverages Mineral  water. Coffee and tea. Diet carbonated beverages. Sweets and desserts Sherbet, gelatin, and fruit ice. Small amounts of dark chocolate. Limit all sweets and desserts. Seasonings and condiments All seasonings and condiments. The items listed above may not be a complete list of foods and drinks you can eat. Contact a dietitian for more options. What foods should I avoid? Fruits Canned fruit in heavy syrup. Fruit in cream or butter sauce. Fried fruit. Limit coconut. Vegetables Vegetables cooked in cheese, cream, or butter sauce. Fried vegetables. Grains Breads that are made with saturated or trans fats, oils, or whole milk. Croissants. Sweet rolls. Donuts. High-fat crackers, such as cheese crackers. Meats and other proteins Fatty meats, such as hot dogs, ribs, sausage, bacon, rib-eye roast or steak. High-fat deli meats, such as salami and bologna. Caviar. Domestic duck and goose. Organ meats, such as liver. Dairy Cream, sour cream, cream cheese, and creamed cottage cheese. Whole-milk cheeses. Whole or 2% milk that is liquid, evaporated, or condensed. Whole buttermilk. Cream sauce or high-fat cheese sauce. Yogurt that is made from whole milk. Fats and oils Meat fat, or shortening. Cocoa butter, hydrogenated oils, palm oil, coconut oil, palm kernel oil. Solid fats and shortenings, including bacon fat, salt pork, lard, and butter. Nondairy  cream substitutes. Salad dressings with cheese or sour cream. Beverages Regular sodas and juice drinks with added sugar. Sweets and desserts Frosting. Pudding. Cookies. Cakes. Pies. Milk chocolate or white chocolate. Buttered syrups. Full-fat ice cream or ice cream drinks. The items listed above may not be a complete list of foods and drinks to avoid. Contact a dietitian for more information. Summary Heart-healthy meal planning includes eating less unhealthy fats, eating more healthy fats, and making other changes in your diet. Eat a balanced diet. This  includes fruits and vegetables, low-fat or nonfat dairy, lean protein, nuts and legumes, whole grains, and heart-healthy oils and fats. This information is not intended to replace advice given to you by your health care provider. Make sure you discuss any questions you have with your health care provider. Document Revised: 09/17/2021 Document Reviewed: 09/17/2021 Elsevier Patient Education  2024 [employer].

## 2023-07-02 ENCOUNTER — Other Ambulatory Visit: Payer: Self-pay

## 2023-07-02 DIAGNOSIS — E7800 Pure hypercholesterolemia, unspecified: Secondary | ICD-10-CM

## 2023-07-02 DIAGNOSIS — Z79899 Other long term (current) drug therapy: Secondary | ICD-10-CM

## 2023-07-02 DIAGNOSIS — E782 Mixed hyperlipidemia: Secondary | ICD-10-CM

## 2023-07-02 LAB — HEPATIC FUNCTION PANEL
ALT: 28 [IU]/L (ref 0–44)
AST: 28 [IU]/L (ref 0–40)
Albumin: 4.1 g/dL (ref 3.8–4.8)
Alkaline Phosphatase: 55 [IU]/L (ref 44–121)
Bilirubin Total: 0.9 mg/dL (ref 0.0–1.2)
Bilirubin, Direct: 0.25 mg/dL (ref 0.00–0.40)
Total Protein: 7.6 g/dL (ref 6.0–8.5)

## 2023-07-02 LAB — LIPID PANEL
Chol/HDL Ratio: 3.9 ratio (ref 0.0–5.0)
Cholesterol, Total: 193 mg/dL (ref 100–199)
HDL: 50 mg/dL (ref >39)
LDL Chol Calc (NIH): 126 mg/dL — ABNORMAL HIGH (ref 0–99)
Triglycerides: 93 mg/dL (ref 0–149)
VLDL Cholesterol Cal: 17 mg/dL (ref 5–40)

## 2023-07-07 ENCOUNTER — Ambulatory Visit (INDEPENDENT_AMBULATORY_CARE_PROVIDER_SITE_OTHER): Payer: Medicare Other | Admitting: Adult Health

## 2023-07-07 ENCOUNTER — Encounter: Payer: Self-pay | Admitting: Adult Health

## 2023-07-07 ENCOUNTER — Telehealth: Payer: Self-pay | Admitting: General Practice

## 2023-07-07 DIAGNOSIS — G4700 Insomnia, unspecified: Secondary | ICD-10-CM | POA: Diagnosis not present

## 2023-07-07 DIAGNOSIS — F331 Major depressive disorder, recurrent, moderate: Secondary | ICD-10-CM

## 2023-07-07 DIAGNOSIS — F411 Generalized anxiety disorder: Secondary | ICD-10-CM | POA: Diagnosis not present

## 2023-07-07 MED ORDER — ZOLPIDEM TARTRATE 10 MG PO TABS
10.0000 mg | ORAL_TABLET | Freq: Every day | ORAL | 2 refills | Status: DC
Start: 2023-07-07 — End: 2023-10-31

## 2023-07-07 MED ORDER — FLUOXETINE HCL 20 MG PO CAPS
20.0000 mg | ORAL_CAPSULE | Freq: Every day | ORAL | 2 refills | Status: DC
Start: 2023-07-07 — End: 2023-09-02

## 2023-07-07 MED ORDER — CLORAZEPATE DIPOTASSIUM 7.5 MG PO TABS
ORAL_TABLET | ORAL | 2 refills | Status: DC
Start: 2023-07-07 — End: 2023-07-29

## 2023-07-07 NOTE — Telephone Encounter (Signed)
Patient identification verified by 2 forms. Marilynn Rail, RN   Called and spoke patient  Patient request recent lab result  Relayed result per result note  Patient states:   -would like to focus on dietary changes first   -will increase some physical activity   -is willing to speak to pharmacy about medication  Patient verbalized understanding, no questions at this time

## 2023-07-07 NOTE — Progress Notes (Addendum)
[name] [id number] 05/05/1951 72 y.o.  Virtual Visit via Telephone Note   I connected with pt on 07/07/23 at 10:20 PM EST by telephone and verified that I am speaking with the correct person using two identifiers.   I discussed the limitations, risks, security and privacy concerns of performing an evaluation and management service by telephone and the availability of in person appointments. I also discussed with the patient that there may be a patient responsible charge related to this service. The patient expressed understanding and agreed to proceed.   I discussed the assessment and treatment plan with the patient. The patient was provided an opportunity to ask questions and all were answered. The patient agreed with the plan and demonstrated an understanding of the instructions.   The patient was advised to call back or seek an in-person evaluation if the symptoms worsen or if the condition fails to improve as anticipated.   I provided 15 minutes of non-face-to-face time during this encounter.  The patient was located at home.  The provider was located at [facility].     Dorothyann Gibbs, NP     Subjective:   Patient ID:  [name] is a 72 y.o. (DOB 1951/04/26) male.  Chief Complaint: No chief complaint on file.   HPI [name] presents to the office today for follow-up of  insomnia, anxiety and depression.   Describes mood today as "ok". Pleasant. Denies tearfulness. Mood symptoms - reports depression, anxiety, and irritability. Denies panic attacks. Reports worry, rumination, and over thinking. Reports increased health issues. Mood is consistent. Stating "I'm haven't been doing too good". Reports ongoing situational stressors - medical and financial issues. Feels like medications are helpful, but may need to be adjusted. Stable interest and motivation. Taking medications as prescribed.  Energy levels stable. Active, does not have a regular  exercise routine.   Enjoys some usual interests and activities. Single. Lives with a roommate - 1 cat and a dog. Mostly staying home.  Appetite adequate. Weight gain 200 to 213 pounds. Sleeps well most nights. Averages 4 to 4.5 hours x 3 to 4 months previously sleeping 8 hours. Focus and concentration difficulties. Completing tasks. Managing aspects of household. Retired, but works "very" part time. Denies SI or HI.  Denies AH or VH. Denies self harm. Denies substance use.  Previous medication trials: Zoloft, Ambien, Tranxene   GAD-7    Flowsheet Row Office Visit from 04/23/2023 in [facility] [employer] Visit from 08/04/2020 in [facility]  Total GAD-7 Score 0 0      PHQ2-9    Flowsheet Row Office Visit from 04/23/2023 in [facility] Visit from 11/01/2022 in [facility] Clinical Support from 10/08/2022 in [facility] Office Visit from 08/04/2020 in [facility] Clinical Support from 12/24/2019 in [facility]  PHQ-2 Total Score 0 4 1 0 0  PHQ-9 Total Score 0 12 -- -- 0      Flowsheet Row ED from 11/01/2020 in [facility] Emergency Department at [facility]  C-SSRS RISK CATEGORY Error: Question 6 not populated        Review of Systems:  Review of Systems  Musculoskeletal:  Negative for gait problem.  Neurological:  Negative for tremors.  Psychiatric/Behavioral:  Please refer to HPI    Medications: I have reviewed the patient's current medications.  Current Outpatient Medications  Medication Sig Dispense Refill   FLUoxetine (PROZAC) 20 MG capsule Take 1 capsule (20 mg total) by mouth daily. 30 capsule 2   clorazepate (TRANXENE) 7.5 MG tablet TAKE 1 TABLET(7.5 MG) BY MOUTH DAILY 30 tablet 2   ibuprofen  (ADVIL,MOTRIN) 200 MG tablet Take 100-200 mg by mouth daily as needed for mild pain.     levothyroxine (SYNTHROID) 25 MCG tablet Take 1 tablet (25 mcg total) by mouth daily before breakfast. 90 tablet 1   Multiple Vitamin (MULTIVITAMIN WITH MINERALS) TABS tablet Take 1 tablet by mouth daily.     pantoprazole (PROTONIX) 40 MG tablet TAKE 1 TABLET(40 MG) BY MOUTH EVERY MORNING 30 MINUTES BEFORE BREAKFAST 30 tablet 6   PROLENSA 0.07 % SOLN Place 1 drop into the right eye See admin instructions.     rivaroxaban (XARELTO) 20 MG TABS tablet Take 1 tablet (20 mg total) by mouth daily with supper. 90 tablet 1   sertraline (ZOLOFT) 100 MG tablet Take 0.5 tablets (50 mg total) by mouth daily.     SIMBRINZA 1-0.2 % SUSP Place 1 drop into both eyes 3 (three) times daily.     zolpidem (AMBIEN CR) 12.5 MG CR tablet TAKE 1 TABLET(12.5 MG) BY MOUTH AT BEDTIME (Patient not taking: Reported on 07/01/2023) 30 tablet 2   zolpidem (AMBIEN) 10 MG tablet Take 1 tablet (10 mg total) by mouth at bedtime. 30 tablet 2   No current facility-administered medications for this visit.    Medication Side Effects: None  Allergies:  Allergies  Allergen Reactions   Crestor [Rosuvastatin]     Mood changes   Escitalopram Oxalate     Mood changes, irritablity   Latex Other (See Comments)    Blood in urine with latex catheter- but this was likely from mechanical irritation, not from the latex itself   Sulfonamide Derivatives Rash    Past Medical History:  Diagnosis Date   Alcohol abuse, unspecified    history of   Anxiety    Arthritis    all over   Bladder cancer [facility]) 2011   Dr. Brunilda Payor   CAD (coronary artery disease) 2012   MI   Depression    Dyslipidemia    GERD (gastroesophageal reflux disease)    History of DVT (deep vein thrombosis) 11/08 and again in 2014   previously on coumadin   Hypertension    Hypothyroidism    Inguinal hernia without mention of obstruction or gangrene, unilateral or unspecified, (not  specified as recurrent)    Internal hemorrhoids without mention of complication    Macular degeneration, wet (HCC)    Myocardial infarction (HCC)    2011    Other abnormal glucose    Personal history of other disorder of urinary system    Pneumonia    hx of 12/2019    Pulmonary embolism (HCC)    hx of    Stroke (HCC) 05/25/2005   TIA   Thrombocytopenia, unspecified (HCC)    TIA (transient ischemic attack) 2008    Past Medical History, Surgical history, Social history, and Family history were reviewed and updated as appropriate.   Please see review of systems for further details on the patient's review from today.   Objective:   Physical Exam:  There were no vitals taken for this visit.  Physical Exam Constitutional:      General: He is not in  acute distress. Musculoskeletal:        General: No deformity.  Neurological:     Mental Status: He is alert and oriented to person, place, and time.     Coordination: Coordination normal.  Psychiatric:        Attention and Perception: Attention and perception normal. He does not perceive auditory or visual hallucinations.        Mood and Affect: Mood normal. Mood is not anxious or depressed. Affect is not labile, blunt, angry or inappropriate.        Speech: Speech normal.        Behavior: Behavior normal.        Thought Content: Thought content normal. Thought content is not paranoid or delusional. Thought content does not include homicidal or suicidal ideation. Thought content does not include homicidal or suicidal plan.        Cognition and Memory: Cognition and memory normal.        Judgment: Judgment normal.     Comments: Insight intact     Lab Review:     Component Value Date/Time   NA 134 (L) 04/23/2023 1324   NA 137 02/18/2019 1549   K 4.0 04/23/2023 1324   CL 105 04/23/2023 1324   CO2 22 04/23/2023 1324   GLUCOSE 97 04/23/2023 1324   BUN 18 04/23/2023 1324   BUN 21 02/18/2019 1549   CREATININE 0.97 04/23/2023  1324   CREATININE 0.88 01/02/2017 0953   CALCIUM 9.0 04/23/2023 1324   PROT 7.6 07/01/2023 1015   ALBUMIN 4.1 07/01/2023 1015   AST 28 07/01/2023 1015   ALT 28 07/01/2023 1015   ALKPHOS 55 07/01/2023 1015   BILITOT 0.9 07/01/2023 1015   GFRNONAA >60 11/01/2020 1043   GFRAA >60 02/25/2020 1254       Component Value Date/Time   WBC 4.4 04/23/2023 1324   RBC 5.27 04/23/2023 1324   HGB 17.2 (H) 04/23/2023 1324   HGB 17.4 02/18/2019 1549   HCT 50.8 04/23/2023 1324   HCT 48.6 02/18/2019 1549   PLT 149.0 (L) 04/23/2023 1324   PLT 170 02/18/2019 1549   MCV 96.3 04/23/2023 1324   MCV 90 02/18/2019 1549   MCH 33.1 11/01/2020 1043   MCHC 33.8 04/23/2023 1324   RDW 13.7 04/23/2023 1324   RDW 12.5 02/18/2019 1549   LYMPHSABS 1.0 04/23/2023 1324   MONOABS 0.7 04/23/2023 1324   EOSABS 0.0 04/23/2023 1324   BASOSABS 0.0 04/23/2023 1324    No results found for: "POCLITH", "LITHIUM"   No results found for: "PHENYTOIN", "PHENOBARB", "VALPROATE", "CBMZ"   .res Assessment: Plan:   Plan:  PDMP reviewed  D/C Zoloft 50mg  daily  Add Prozac 20mg  daily  Ambien 10mg  daily  Chlorazepate 7.5mg  daily   RTC 6 months  Patient advised to contact office with any questions, adverse effects, or acute worsening in signs and symptoms.  Discussed potential benefits, risk, and side effects of benzodiazepines to include potential risk of tolerance and dependence, as well as possible drowsiness.  Advised patient not to drive if experiencing drowsiness and to take lowest possible effective dose to minimize risk of dependence and tolerance.  Diagnoses and all orders for this visit:  Major depressive disorder, recurrent episode, moderate (HCC) -     FLUoxetine (PROZAC) 20 MG capsule; Take 1 capsule (20 mg total) by mouth daily.  Generalized anxiety disorder -     clorazepate (TRANXENE) 7.5 MG tablet; TAKE 1 TABLET(7.5 MG) BY MOUTH DAILY  Insomnia, unspecified  type -     zolpidem (AMBIEN) 10 MG  tablet; Take 1 tablet (10 mg total) by mouth at bedtime.     Please see After Visit Summary for patient specific instructions.  Future Appointments  Date Time Provider Department Center  10/13/2023  9:15 AM [facility] ANNUAL WELLNESS VISIT 1 [facility]    No orders of the defined types were placed in this encounter.   -------------------------------

## 2023-07-07 NOTE — Telephone Encounter (Signed)
Pt is returning call to nurse for lab results. Please advise

## 2023-07-09 ENCOUNTER — Encounter: Payer: Self-pay | Admitting: Psychiatry

## 2023-07-09 ENCOUNTER — Other Ambulatory Visit: Payer: Self-pay

## 2023-07-09 DIAGNOSIS — E782 Mixed hyperlipidemia: Secondary | ICD-10-CM

## 2023-07-09 DIAGNOSIS — E7800 Pure hypercholesterolemia, unspecified: Secondary | ICD-10-CM

## 2023-07-27 DIAGNOSIS — R059 Cough, unspecified: Secondary | ICD-10-CM | POA: Diagnosis not present

## 2023-07-27 DIAGNOSIS — J209 Acute bronchitis, unspecified: Secondary | ICD-10-CM | POA: Diagnosis not present

## 2023-07-27 DIAGNOSIS — Z20822 Contact with and (suspected) exposure to covid-19: Secondary | ICD-10-CM | POA: Diagnosis not present

## 2023-07-27 DIAGNOSIS — J029 Acute pharyngitis, unspecified: Secondary | ICD-10-CM | POA: Diagnosis not present

## 2023-07-28 ENCOUNTER — Other Ambulatory Visit: Payer: Self-pay | Admitting: Adult Health

## 2023-07-28 DIAGNOSIS — F411 Generalized anxiety disorder: Secondary | ICD-10-CM

## 2023-07-29 NOTE — Telephone Encounter (Signed)
Patient due for FU this month. Please call to schedule.

## 2023-08-05 DIAGNOSIS — H353124 Nonexudative age-related macular degeneration, left eye, advanced atrophic with subfoveal involvement: Secondary | ICD-10-CM | POA: Diagnosis not present

## 2023-08-05 DIAGNOSIS — H2512 Age-related nuclear cataract, left eye: Secondary | ICD-10-CM | POA: Diagnosis not present

## 2023-08-05 DIAGNOSIS — H353113 Nonexudative age-related macular degeneration, right eye, advanced atrophic without subfoveal involvement: Secondary | ICD-10-CM | POA: Diagnosis not present

## 2023-08-05 DIAGNOSIS — H353222 Exudative age-related macular degeneration, left eye, with inactive choroidal neovascularization: Secondary | ICD-10-CM | POA: Diagnosis not present

## 2023-08-12 ENCOUNTER — Ambulatory Visit: Payer: Medicare Other | Admitting: Adult Health

## 2023-08-12 DIAGNOSIS — Z0389 Encounter for observation for other suspected diseases and conditions ruled out: Secondary | ICD-10-CM

## 2023-08-12 NOTE — Progress Notes (Signed)
Patient should not have been scheduled for visit - seen every 6 months.

## 2023-08-28 ENCOUNTER — Other Ambulatory Visit: Payer: Self-pay | Admitting: Physician Assistant

## 2023-08-28 DIAGNOSIS — B354 Tinea corporis: Secondary | ICD-10-CM | POA: Diagnosis not present

## 2023-08-29 ENCOUNTER — Telehealth: Payer: Self-pay | Admitting: Cardiovascular Disease

## 2023-08-29 NOTE — Telephone Encounter (Signed)
 Called patient with no answer. Left message to call back

## 2023-08-29 NOTE — Telephone Encounter (Signed)
 Patient calling the office for samples of medication:   1.  What medication and dosage are you requesting samples for? rivaroxaban (XARELTO) 20 MG TABS tablet  2.  Are you currently out of this medication? Yes

## 2023-08-29 NOTE — Telephone Encounter (Signed)
 Patient did return call. Informed patient we were out of samples. Inform patient to call apply for the Medicare monthly payment option. Pt verbalized understanding and agree.

## 2023-09-02 ENCOUNTER — Encounter: Payer: Self-pay | Admitting: Family Medicine

## 2023-09-02 ENCOUNTER — Ambulatory Visit: Payer: Medicare Other | Admitting: Family Medicine

## 2023-09-02 VITALS — BP 134/70 | HR 60 | Temp 99.0°F | Ht 69.5 in | Wt 213.8 lb

## 2023-09-02 DIAGNOSIS — K219 Gastro-esophageal reflux disease without esophagitis: Secondary | ICD-10-CM | POA: Diagnosis not present

## 2023-09-02 DIAGNOSIS — Z86718 Personal history of other venous thrombosis and embolism: Secondary | ICD-10-CM

## 2023-09-02 DIAGNOSIS — Z13820 Encounter for screening for osteoporosis: Secondary | ICD-10-CM | POA: Diagnosis not present

## 2023-09-02 DIAGNOSIS — E039 Hypothyroidism, unspecified: Secondary | ICD-10-CM

## 2023-09-02 DIAGNOSIS — Z0000 Encounter for general adult medical examination without abnormal findings: Secondary | ICD-10-CM

## 2023-09-02 DIAGNOSIS — Z7901 Long term (current) use of anticoagulants: Secondary | ICD-10-CM

## 2023-09-02 DIAGNOSIS — F411 Generalized anxiety disorder: Secondary | ICD-10-CM

## 2023-09-02 DIAGNOSIS — R21 Rash and other nonspecific skin eruption: Secondary | ICD-10-CM

## 2023-09-02 DIAGNOSIS — Z7189 Other specified counseling: Secondary | ICD-10-CM

## 2023-09-02 NOTE — Patient Instructions (Addendum)
 Let me know if you need help getting an appointment with [facility].  Take care.  Glad to see you.  You can call for a bone density test at [facility].  [address] [place]  You should get a call from pharmacy staff about the xarelto .   Take care.  Glad to see you.

## 2023-09-02 NOTE — Progress Notes (Signed)
 Anxiety managed per outside clinic.  I will defer.  He agrees.  Statin intolerant.    Hypothyroidism. Still on replacement with recent TSH wnl.  Discussed with patient.  Compliant.  He is getting injections re: macular degeneration.  He is understandably frustrated about his vision loss.  GERD controlled with PPI.  No ADE on med.    H/o DVT.  Prev on xarelto , no spontaneous bleeding.  Plan for long term anticoagulation, he agrees.  He had financial strain and couldn't afford xarelto .  Referral placed to pharmacy.    Tetanus 2010, d/w pt.   Flu 2024 PNA 2020 Shingles d/w pt.   covid prev done.  Prostate cancer screening deferred at this point.  D/w pt about asking for urology input.   Colonoscopy 2022.   Living will d/w pt.  Sharolyn Hammersmith designated if patient were incapacitated.   Diet and exercise d/w pt.    D/w pt about getting urology follow up and he can call about that.    He saw dermatology about ringworm on R thigh, on tx with ketoconazole.  Some better in the meantime.  He is going to continue with ketoconazole.  Height lower than prev.    Meds, vitals, and allergies reviewed.   ROS: Per HPI unless specifically indicated in ROS section   GEN: nad, alert and oriented HEENT: mucous membranes moist NECK: supple w/o LA CV: rrr.  PULM: ctab, no inc wob ABD: soft, +bs EXT: L lower leg puffy at baseline, no change from prior.   SKIN: Benign-appearing ringworm rash noted on the right proximal thigh, anteriorly.

## 2023-09-03 ENCOUNTER — Telehealth: Payer: Self-pay

## 2023-09-03 DIAGNOSIS — Z13820 Encounter for screening for osteoporosis: Secondary | ICD-10-CM | POA: Insufficient documentation

## 2023-09-03 DIAGNOSIS — K219 Gastro-esophageal reflux disease without esophagitis: Secondary | ICD-10-CM | POA: Insufficient documentation

## 2023-09-03 DIAGNOSIS — R21 Rash and other nonspecific skin eruption: Secondary | ICD-10-CM | POA: Insufficient documentation

## 2023-09-03 NOTE — Telephone Encounter (Signed)
 Spoke with patient and advised that the referral has been placed. Also routing message to Lillia Abed to see if there is some way to assist the patient.

## 2023-09-03 NOTE — Assessment & Plan Note (Signed)
  Tetanus 2010, d/w pt.   Flu 2024 PNA 2020 Shingles d/w pt.   covid prev done.  Prostate cancer screening deferred at this point.  D/w pt about asking for urology input.   Colonoscopy 2022.   Living will d/w pt.  Sharolyn Hammersmith designated if patient were incapacitated.   Diet and exercise d/w pt.

## 2023-09-03 NOTE — Assessment & Plan Note (Signed)
Per outside clinic.  I will defer.  He agrees. 

## 2023-09-03 NOTE — Assessment & Plan Note (Signed)
 Still on replacement with recent TSH wnl.  Discussed with patient.  Compliant.  Continue as is.

## 2023-09-03 NOTE — Assessment & Plan Note (Signed)
 Continue ketoconazole.  Improving in the meantime.

## 2023-09-03 NOTE — Assessment & Plan Note (Signed)
 Controlled with PPI.  Continue as is.

## 2023-09-03 NOTE — Telephone Encounter (Signed)
 I put in the referral yesterday but I haven't heard back yet.  Please check with pharmacy staff to see what they can offer.  I thank all involved.

## 2023-09-03 NOTE — Assessment & Plan Note (Signed)
 Discussed getting bone density test done, given loss of height.

## 2023-09-03 NOTE — Assessment & Plan Note (Signed)
Living will d/w pt.  Denyse Amass designated if patient were incapacitated.

## 2023-09-03 NOTE — Telephone Encounter (Signed)
 Copied from CRM [id number]. Topic: Clinical - Medication Question >> Sep 03, 2023  1:56 PM Melissa C wrote: Reason for CRM: patient states that at his appointment doctor mentioned that he was going to see if pharmacist could get him some rivaroxaban  (XARELTO ) 20 MG TABS tablet. Patient was wondering if that was able to happen for him. Please advise to patient. Thank you

## 2023-09-03 NOTE — Assessment & Plan Note (Signed)
 Prev on xarelto, no spontaneous bleeding.  Plan for long term anticoagulation, he agrees.  He had financial strain and couldn't afford xarelto.  Referral placed to pharmacy.   Goal to restart Xarelto as soon as possible.

## 2023-09-04 ENCOUNTER — Other Ambulatory Visit (INDEPENDENT_AMBULATORY_CARE_PROVIDER_SITE_OTHER): Payer: Medicare Other | Admitting: Pharmacist

## 2023-09-04 DIAGNOSIS — Z86718 Personal history of other venous thrombosis and embolism: Secondary | ICD-10-CM

## 2023-09-04 NOTE — Progress Notes (Signed)
   09/04/2023 Name: [name] MRN: [id number] DOB: Aug 03, 1951  Subjective  Chief Complaint  Patient presents with   Medication Access    Care Team: Primary Care Provider: Cleatus Arlyss RAMAN, MD  Reason for visit: ?  [name] is a 73 y.o. male who presents today for a telephone visit with the pharmacist due to medication access concerns regarding their Xarelto  in the new year. ?   Medication Access: ?  Prescription drug coverage: Payor: BLUE CROSS BLUE SHIELD MEDICARE / Plan: BCBS MEDICARE / Product Type: *No Product type* / .   Current Patient Assistance:  None  Patient reports recent price increase in the new year (deductible).  Last year, he felt cost was feasible.   Patient lives in a household of 1 with an estimated combined monthly income of 47$  via Social Security Retirement.  Medicare LIS Eligible:  Possibly Individual Income Limit $1843/month and Individual Asset Limit: $17,220  Assessment and Plan:   1. Medication Access Patient is not eligible for copay cards due to government insurance. Xarelto : Notably, Comptroller Patient Assistance program requires OOP pharmacy expenditure of >4% gross household income. 4% of estimated income would be $384 on prescription medication costs in 2025.   Eliquis  MAP program requirements are similar to those of Xarelto , though OOP spending is slightly lower at 3% of annual income which would be around $288 based on his reported income. .   Reviewed with patient the elimination of the donut hole for all Medicare plans in 2025, hopefully saving him some money in the long-run.  Discussed Medicare D deductible which is standard each year. After reaching deductible, his copays should return to normal. However, he may be eligible for Xarelto /Eliquis  PAP prior to that point if his estimated income is accurate.  Patient to contact insurance to see what his initial deductible is Consider Medicare LIS/Extra Help Patient's income would  likely qualify for Medicare Extra Help (though unsure of assets at this time). If assets <17,220, he will apply for Extra Help which will drop his copay significantly.    Last line options could include warfarin, or generic dabigatran which usually runs cheaper compared to Xarelto /Eliquis .  Patient would like to call back to discuss further once he has a better idea of income/assets. Provided with direct pharmacist line   Future Appointments  Date Time Provider Department Center  10/13/2023  9:30 AM [facility] ANNUAL WELLNESS VISIT 1 [facility]    Manuelita FABIENE Kobs, PharmD Clinical Pharmacist [facility] [phone]

## 2023-09-05 ENCOUNTER — Telehealth: Payer: Self-pay

## 2023-09-05 NOTE — Progress Notes (Signed)
 Care Guide Pharmacy Note  09/05/2023 Name: [name] MRN: [id number] DOB: 1951/06/05  Referred By: Cleatus Arlyss RAMAN, MD Reason for referral: Care Coordination (Outreach to schedule with Pharm d )   [name] is a 73 y.o. year old male who is a primary care patient of Cleatus Arlyss RAMAN, MD.  [name] was referred to the pharmacist for assistance related to:  deep vein thrombosis  Successful contact was made with the patient to discuss pharmacy services including being ready for the pharmacist to call at least 5 minutes before the scheduled appointment time and to have medication bottles and any blood pressure readings ready for review. The patient agreed to meet with the pharmacist via telephone visit on (date/time).09/10/2023  Jeoffrey Buffalo , RMA     [facility]  Direct Dial: [phone]  Website: [email or web address]

## 2023-09-10 ENCOUNTER — Other Ambulatory Visit (INDEPENDENT_AMBULATORY_CARE_PROVIDER_SITE_OTHER): Payer: Medicare Other

## 2023-09-10 DIAGNOSIS — Z7901 Long term (current) use of anticoagulants: Secondary | ICD-10-CM

## 2023-09-10 NOTE — Progress Notes (Signed)
 09/10/2023 Name: ORVIL MONTAGUE MRN: [id number] DOB: 05-03-1951  Subjective  Chief Complaint  Patient presents with   Medication Access    Care Team: Primary Care Provider: Donnie Galea, MD  Reason for visit: ?  [name] is a 73 y.o. male who presents today for a telephone visit with the pharmacist due to medication access concerns regarding their Xarelto  in the new year. ?  We discussed this matter last week and I advised patient to: Contact insurance plan to determine what his deductible is for medications this year Complete online form for Medicare Low Income Subsidy. If approved, this would significantly reduce his medication costs on all medications.  Medication Access: ?  Prescription drug coverage: Payor: BLUE CROSS BLUE SHIELD MEDICARE / Plan: BCBS MEDICARE / Product Type: *No Product type* / .   Per patient: Has been on Xarelto  for years due to Hx DVT w PE, provoked s/p procedure. Though recurred a few years later in the same leg, unprovoked. Has always been on Xarelto , denies ever taking Eliquis  or any other blood thinner.   Has not called insurance to determine deductible. Reports he will request an email of his insurance card and ask BCBS what his deductible is today.  Regarding current supply of medication: Went for 11 days without Xarelto . Most recently paid $89 for 5 tablets.  Denies s/sx clotting or bleeding. Denies changes to breathing, no swelling/redness of extremities.   Current Patient Assistance:  None  Patient reports recent price increase in the new year (deductible).  Last year, he felt cost was feasible.   Patient lives in a household of 1 with an estimated combined monthly income of [phone]  via Social Security Retirement.  Medicare LIS Eligible:  Possibly  later this year once savings falls below 17,220.  Individual Income Limit $1843/month and Individual Asset Limit: $17,220. Upon discussion today, seems that he is likely not eligible due  to a Trust account with ~$25000 in it. However, reports that this is being dissolved over the next month or 2.   Assessment and Plan:   1. Medication Access Patient is not eligible for copay cards due to government insurance. Xarelto : Notably, Comptroller Patient Assistance program requires OOP pharmacy expenditure of >4% gross household income. 4% of estimated income would be $384 on prescription medication costs in 2025.   Eliquis  MAP program requirements are similar to those of Xarelto , though OOP spending is slightly lower at 3% of annual income which would be around $288 based on his reported income. Eliquis  MAP is likely feasible if actual income is close to patient's estimate.  May be eligible for Eliquis  30-day free trial card given pt report of no Hx of Eliquis  use... however, chart suggests use in 2016 for short duration Cardiologist previously documented: Croitoru, Karyl Paget, MD - Cardiology Encounter Date: 2/16/202 "I have no problem switching to Eliquis , I do believe it probably comes with a lower bleeding risk then Xarelto .  Just please point out to him that it is a twice a day medication."  Request sent to CPhT team for Test Claim for Eliquis     Insurance: Patient to contact insurance to see what his initial deductible is Although likely just above cutoff, assist patient with Medicare LIS/"Extra Help" application online today.  If denied, we will plan to re-apply in a few months once total savings is <$17,220.    Future Appointments  Date Time Provider Department Center  10/13/2023  9:30 AM [facility] ANNUAL WELLNESS VISIT 1  [facility]   Daron Ellen, PharmD Clinical Pharmacist [facility] [phone]

## 2023-09-11 ENCOUNTER — Other Ambulatory Visit: Payer: Self-pay | Admitting: Pharmacist

## 2023-09-11 ENCOUNTER — Telehealth: Payer: Self-pay | Admitting: Family Medicine

## 2023-09-11 ENCOUNTER — Telehealth: Payer: Self-pay

## 2023-09-11 ENCOUNTER — Other Ambulatory Visit (HOSPITAL_COMMUNITY): Payer: Self-pay

## 2023-09-11 DIAGNOSIS — Z86718 Personal history of other venous thrombosis and embolism: Secondary | ICD-10-CM

## 2023-09-11 DIAGNOSIS — Z7901 Long term (current) use of anticoagulants: Secondary | ICD-10-CM

## 2023-09-11 MED ORDER — APIXABAN 5 MG PO TABS: ORAL_TABLET | ORAL | 5 refills | Status: AC

## 2023-09-11 NOTE — Telephone Encounter (Signed)
Please update patient.  Stay off xarelto and change to eliquis.  Rx sent.  Thanks.

## 2023-09-11 NOTE — Telephone Encounter (Signed)
     PLEASE BE ADVISE FREE TRIAL CARD WORK THAT IS GOOD FOR 60 30 DAYS SUPPLY.

## 2023-09-11 NOTE — Telephone Encounter (Signed)
-----   Message from Loree Fee sent at 09/10/2023 10:15 AM EST ----- Marked as urgent due to patient not taking anticoagulation due to med cost  Would it be possible to run a test claim for Eliquis 5 mg BID for 30 days?  - Also if you are able to see any information on what patient's deductible is, please let us know - this would be super helpful.  - Please also try to run Eliquis free trial offer to see if it goes through (card in my note)

## 2023-09-11 NOTE — Telephone Encounter (Signed)
Patient has been given information from pharmacist Berenice Primas. Closing encounter

## 2023-09-11 NOTE — Telephone Encounter (Signed)
Pharmacy Patient Advocate Encounter Ran test claim for [facility]. Currently a quantity of 60 is a 30 day supply and the co-pay is 251.68 .  PLEASE BE ADVISED THAT  This test claim was processed through [facility]- copay amounts may vary at other pharmacies due to pharmacy/plan contracts, or as the patient moves through the different stages of their insurance plan.       IT LOOKS LIKE HE DOES HAVE A DEDUCTIBLE. IT LOOKS LIKE ONCE HE MEETS THE DEDUCTIBLE HIS COPAY SHOULD BE 45.00

## 2023-09-11 NOTE — Telephone Encounter (Signed)
Left voicemail for patient to return call to office. 

## 2023-09-19 NOTE — Telephone Encounter (Signed)
Copied from CRM [id number]. Topic: Clinical - Prescription Issue >> Sep 19, 2023 12:23 PM Corin V wrote: Reason for CRM: Patient has been speaking with pharmacist lindsay about a free sample of Eliquis. Patient went to pharmacy and they did not have information and cost was over $200. Please call patient back with instructions on getting the free 30 day supply of Eliquis.

## 2023-09-22 ENCOUNTER — Ambulatory Visit (INDEPENDENT_AMBULATORY_CARE_PROVIDER_SITE_OTHER): Payer: Medicare Other | Admitting: Internal Medicine

## 2023-09-22 ENCOUNTER — Encounter: Payer: Self-pay | Admitting: Internal Medicine

## 2023-09-22 VITALS — BP 100/66 | HR 57 | Temp 98.2°F | Wt 213.0 lb

## 2023-09-22 DIAGNOSIS — I872 Venous insufficiency (chronic) (peripheral): Secondary | ICD-10-CM | POA: Insufficient documentation

## 2023-09-22 MED ORDER — CEPHALEXIN 500 MG PO CAPS: 500.0000 mg | ORAL_CAPSULE | Freq: Three times a day (TID) | ORAL | 1 refills | Status: DC

## 2023-09-22 NOTE — Assessment & Plan Note (Addendum)
Chronic but appears to have mild cellulitis that seems to complicate this regularly Will send Rx for cephalexin 500mg  tid for 5 days (with refill) Seeing dermatologist tomorrow also

## 2023-09-22 NOTE — Progress Notes (Signed)
Subjective:    Patient ID: [name], male    DOB: 08-Nov-1950, 73 y.o.   MRN: [id number]  HPI Here due to rash on leg  Feels the rash goes back to DVT and PE after hammer toe surgery Rash on lower leg that flares at times Has been treated with keflex at times--and will reduce swelling in calf and foot Also has ointment from dermatologist for use   Right calf is always about 50% larger than the left (and gets worse with flares) Gets dark area with depression on lower medial right calf  Had chronic DVT in right common femoral vein, femoral and popliteal vein and posterior tibial veins--noted in 09/2020 Also abnormal in 2014--but no DVT in 2009  Current Outpatient Medications on File Prior to Visit  Medication Sig Dispense Refill   apixaban (ELIQUIS) 5 MG TABS tablet Take 1 tablet by mouth every 12 hours. REPLACES Xarleto. 60 tablet 5   clorazepate (TRANXENE) 7.5 MG tablet TAKE 1 TABLET(7.5 MG) BY MOUTH DAILY 30 tablet 0   ibuprofen (ADVIL,MOTRIN) 200 MG tablet Take 100-200 mg by mouth daily as needed for mild pain.     ketoconazole (NIZORAL) 2 % cream Apply topically 2 (two) times daily.     levothyroxine (SYNTHROID) 25 MCG tablet Take 1 tablet (25 mcg total) by mouth daily before breakfast. 90 tablet 1   Multiple Vitamin (MULTIVITAMIN WITH MINERALS) TABS tablet Take 1 tablet by mouth daily.     pantoprazole (PROTONIX) 40 MG tablet TAKE 1 TABLET EVERY MORNING BEFORE BREAKFAST 30 tablet 4   PROLENSA 0.07 % SOLN Place 1 drop into the right eye See admin instructions.     sertraline (ZOLOFT) 100 MG tablet Take 0.5 tablets (50 mg total) by mouth daily.     SIMBRINZA 1-0.2 % SUSP Place 1 drop into both eyes 3 (three) times daily.     zolpidem (AMBIEN) 10 MG tablet Take 1 tablet (10 mg total) by mouth at bedtime. 30 tablet 2   No current facility-administered medications on file prior to visit.    Allergies  Allergen Reactions   Crestor [Rosuvastatin]     Mood changes    Escitalopram Oxalate     Mood changes, irritablity   Latex Other (See Comments)    Blood in urine with latex catheter- but this was likely from mechanical irritation, not from the latex itself   Sulfonamide Derivatives Rash    Past Medical History:  Diagnosis Date   Alcohol abuse, unspecified    history of   Anxiety    Arthritis    all over   Bladder cancer [facility]) 2011   Dr. Brunilda Payor   CAD (coronary artery disease) 2012   MI   Depression    Dyslipidemia    GERD (gastroesophageal reflux disease)    History of DVT (deep vein thrombosis) 11/08 and again in 2014   previously on coumadin   Hypertension    Hypothyroidism    Inguinal hernia without mention of obstruction or gangrene, unilateral or unspecified, (not specified as recurrent)    Internal hemorrhoids without mention of complication    Macular degeneration, wet (HCC)    Myocardial infarction [facility])    2011    Other abnormal glucose    Personal history of other disorder of urinary system    Pneumonia    hx of 12/2019    Pulmonary embolism (HCC)    hx of    Stroke (HCC) 05/25/2005   TIA   Thrombocytopenia,  unspecified (HCC)    TIA (transient ischemic attack) 2008    Past Surgical History:  Procedure Laterality Date   CARDIAC CATHETERIZATION  05/2011   mild atherosclerosis   CYSTOSCOPY  07/12/2011   Procedure: CYSTOSCOPY;  Surgeon: Lindaann Slough, MD;  Location: [facility];  Service: Urology;  Laterality: N/A;  one hour being requested for this case   EYE SURGERY Left    injection from wet AMD (age related macular degeneration)   HERNIA REPAIR  8/11   repair of right indirect and direct hernias; incarerated umbilical hernia containing preperitoneal fat   INGUINAL HERNIA REPAIR Left 11/14/2020   Procedure: OPEN LEFT INGUINAL HERNIA REPAIR WITH MESH;  Surgeon: Luretha Murphy, MD;  Location: [facility];  Service: General;  Laterality: Left;  90 MIN   SHOULDER SURGERY  03/08/08   Left; arthroscopy   TONSILLECTOMY     as child    TRANSURETHRAL RESECTION OF BLADDER TUMOR  07/12/2011   Procedure: TRANSURETHRAL RESECTION OF BLADDER TUMOR (TURBT);  Surgeon: Lindaann Slough, MD;  Location: [facility];  Service: Urology;  Laterality: N/A;    Family History  Problem Relation Age of Onset   Other Father        Hypotension from medication causing GI bleed   Heart failure Father    Emphysema Mother    Prostate cancer Neg Hx     Social History   Socioeconomic History   Marital status: Single    Spouse name: Not on file   Number of children: 0   Years of education: Not on file   Highest education level: Not on file  Occupational History   Occupation: Photographer  Tobacco Use   Smoking status: Former    Current packs/day: 0.00    Average packs/day: 1.5 packs/day for 25.0 years (37.5 ttl pk-yrs)    Types: Cigarettes    Start date: 08/27/1979    Quit date: 08/26/2004    Years since quitting: 19.0   Smokeless tobacco: Never  Vaping Use   Vaping status: Never Used  Substance and Sexual Activity   Alcohol use: No    Alcohol/week: 0.0 standard drinks of alcohol    Comment: Quit 2009   Drug use: Not Currently    Frequency: 2.0 times per week    Types: Marijuana    Comment: MJ-regular use  10/2013 no longer uses   Sexual activity: Yes  Other Topics Concern   Not on file  Social History Narrative   Photographer   Single; 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   Social Drivers of Health   Financial Resource Strain: Low Risk  (10/08/2022)   Overall Financial Resource Strain (CARDIA)    Difficulty of Paying Living Expenses: Not hard at all  Food Insecurity: No Food Insecurity (10/08/2022)   Hunger Vital Sign    Worried About Running Out of Food in the Last Year: Never true    Ran Out of Food in the Last Year: Never true  Transportation Needs: No Transportation Needs (10/08/2022)   PRAPARE - Administrator, Civil Service (Medical): No    Lack of Transportation (Non-Medical): No  Physical Activity: Sufficiently  Active (10/08/2022)   Exercise Vital Sign    Days of Exercise per Week: 7 days    Minutes of Exercise per Session: 30 min  Stress: Stress Concern Present (10/08/2022)   [employer] of Occupational Health - Occupational Stress Questionnaire    Feeling of Stress : To some extent  Social Connections: Socially  Isolated (10/08/2022)   Social Connection and Isolation Panel [NHANES]    Frequency of Communication with Friends and Family: Twice a week    Frequency of Social Gatherings with Friends and Family: Never    Attends Religious Services: Never    Database administrator or Organizations: No    Attends Banker Meetings: Never    Marital Status: Never married  Intimate Partner Violence: Not At Risk (10/08/2022)   Humiliation, Afraid, Rape, and Kick questionnaire    Fear of Current or Ex-Partner: No    Emotionally Abused: No    Physically Abused: No    Sexually Abused: No   Review of Systems No chest pain No SOB Wears support hose all the time No fever      Physical Exam Musculoskeletal:     Comments: Swelling in entire right calf but not really tender Stasis changes in entire calf----has reddened area along medial right calf, mild redness and tenderness            Assessment & Plan:

## 2023-10-02 DIAGNOSIS — L853 Xerosis cutis: Secondary | ICD-10-CM | POA: Diagnosis not present

## 2023-10-02 DIAGNOSIS — R202 Paresthesia of skin: Secondary | ICD-10-CM | POA: Diagnosis not present

## 2023-10-13 ENCOUNTER — Ambulatory Visit (INDEPENDENT_AMBULATORY_CARE_PROVIDER_SITE_OTHER): Payer: Medicare Other

## 2023-10-13 VITALS — Ht 69.5 in | Wt 213.0 lb

## 2023-10-13 DIAGNOSIS — Z0000 Encounter for general adult medical examination without abnormal findings: Secondary | ICD-10-CM

## 2023-10-13 NOTE — Progress Notes (Signed)
Please attest and cosign this visit due to patients primary care provider not being in the office at the time the visit was completed.    Subjective:   [name] is a 73 y.o. male who presents for Medicare Annual/Subsequent preventive examination.  Visit Complete: Virtual I connected with  [name] on 10/13/23 by a audio enabled telemedicine application and verified that I am speaking with the correct person using two identifiers.  Patient Location: Home  Provider Location: Home Office  I discussed the limitations of evaluation and management by telemedicine. The patient expressed understanding and agreed to proceed.  Vital Signs: Because this visit was a virtual/telehealth visit, some criteria may be missing or patient reported. Any vitals not documented were not able to be obtained and vitals that have been documented are patient reported.  Patient Medicare AWV questionnaire was completed by the patient on (not done); I have confirmed that all information answered by patient is correct and no changes since this date.  Cardiac Risk Factors include: advanced age (>66men, >23 women);obesity (BMI >30kg/m2);male gender    Objective:    Today's Vitals   10/13/23 0930  Weight: 213 lb (96.6 kg)  Height: 5' 9.5" (1.765 m)   Body mass index is 31 kg/m.     10/13/2023    9:58 AM 10/08/2022    9:25 AM 11/14/2020    8:17 AM 11/01/2020    3:47 PM 12/24/2019   11:22 AM 05/08/2019   11:17 PM 07/12/2011    5:59 AM  Advanced Directives  Does Patient Have a Medical Advance Directive? Yes No No No Yes No Patient does not have advance directive  Type of Public librarian Power of [place];Living will    Healthcare Power of [place];Living will    Copy of Healthcare Power of Attorney in Chart? No - copy requested    No - copy requested    Would patient like information on creating a medical advance directive?  No - Patient declined No - Patient declined No - Patient  declined  No - Patient declined     Current Medications (verified) Outpatient Encounter Medications as of 10/13/2023  Medication Sig   apixaban (ELIQUIS) 5 MG TABS tablet Take 1 tablet by mouth every 12 hours. REPLACES Xarleto.   clorazepate (TRANXENE) 7.5 MG tablet TAKE 1 TABLET(7.5 MG) BY MOUTH DAILY   ibuprofen (ADVIL,MOTRIN) 200 MG tablet Take 100-200 mg by mouth daily as needed for mild pain.   ketoconazole (NIZORAL) 2 % cream Apply topically 2 (two) times daily.   levothyroxine (SYNTHROID) 25 MCG tablet Take 1 tablet (25 mcg total) by mouth daily before breakfast.   pantoprazole (PROTONIX) 40 MG tablet TAKE 1 TABLET EVERY MORNING BEFORE BREAKFAST   PROLENSA 0.07 % SOLN Place 1 drop into the right eye See admin instructions.   sertraline (ZOLOFT) 100 MG tablet Take 0.5 tablets (50 mg total) by mouth daily.   SIMBRINZA 1-0.2 % SUSP Place 1 drop into both eyes 3 (three) times daily.   zolpidem (AMBIEN) 10 MG tablet Take 1 tablet (10 mg total) by mouth at bedtime.   cephALEXin (KEFLEX) 500 MG capsule Take 1 capsule (500 mg total) by mouth 3 (three) times daily. (Patient not taking: Reported on 10/13/2023)   Multiple Vitamin (MULTIVITAMIN WITH MINERALS) TABS tablet Take 1 tablet by mouth daily.   No facility-administered encounter medications on file as of 10/13/2023.    Allergies (verified) Crestor [rosuvastatin], Escitalopram oxalate, Latex, and Sulfonamide derivatives  History: Past Medical History:  Diagnosis Date   Alcohol abuse, unspecified    history of   Anxiety    Arthritis    all over   Bladder cancer [facility]) 2011   Dr. Brunilda Payor   CAD (coronary artery disease) 2012   MI   Depression    Dyslipidemia    GERD (gastroesophageal reflux disease)    History of DVT (deep vein thrombosis) 11/08 and again in 2014   previously on coumadin   Hypertension    Hypothyroidism    Inguinal hernia without mention of obstruction or gangrene, unilateral or unspecified, (not specified as  recurrent)    Internal hemorrhoids without mention of complication    Macular degeneration, wet (HCC)    Myocardial infarction (HCC)    2011    Other abnormal glucose    Personal history of other disorder of urinary system    Pneumonia    hx of 12/2019    Pulmonary embolism (HCC)    hx of    Stroke (HCC) 05/25/2005   TIA   Thrombocytopenia, unspecified (HCC)    TIA (transient ischemic attack) 2008   Past Surgical History:  Procedure Laterality Date   CARDIAC CATHETERIZATION  05/2011   mild atherosclerosis   CYSTOSCOPY  07/12/2011   Procedure: CYSTOSCOPY;  Surgeon: Lindaann Slough, MD;  [facility];  Service: Urology;  Laterality: N/A;  one hour being requested for this case   EYE SURGERY Left    injection from wet AMD (age related macular degeneration)   HERNIA REPAIR  8/11   repair of right indirect and direct hernias; incarerated umbilical hernia containing preperitoneal fat   INGUINAL HERNIA REPAIR Left 11/14/2020   Procedure: OPEN LEFT INGUINAL HERNIA REPAIR WITH MESH;  Surgeon: Luretha Murphy, MD;  Location[facility];  Service: General;  Laterality: Left;  90 MIN   SHOULDER SURGERY  03/08/08   Left; arthroscopy   TONSILLECTOMY     as child   TRANSURETHRAL RESECTION OF BLADDER TUMOR  07/12/2011   Procedure: TRANSURETHRAL RESECTION OF BLADDER TUMOR (TURBT);  Surgeon: Lindaann Slough, MD;  [facility];  Service: Urology;  Laterality: N/A;   Family History  Problem Relation Age of Onset   Other Father        Hypotension from medication causing GI bleed   Heart failure Father    Emphysema Mother    Prostate cancer Neg Hx    Social History   Socioeconomic History   Marital status: Single    Spouse name: Not on file   Number of children: 0   Years of education: Not on file   Highest education level: Not on file  Occupational History   Occupation: Photographer  Tobacco Use   Smoking status: Former    Current packs/day: 0.00    Average packs/day: 1.5 packs/day  for 25.0 years (37.5 ttl pk-yrs)    Types: Cigarettes    Start date: 08/27/1979    Quit date: 08/26/2004    Years since quitting: 19.1   Smokeless tobacco: Never  Vaping Use   Vaping status: Never Used  Substance and Sexual Activity   Alcohol use: No    Alcohol/week: 0.0 standard drinks of alcohol    Comment: Quit 2009   Drug use: Not Currently    Frequency: 2.0 times per week    Types: Marijuana    Comment: MJ-regular use  10/2013 no longer uses   Sexual activity: Yes  Other Topics Concern   Not on file  Social History Narrative   Sports coach; 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   Social Drivers of Corporate investment banker Strain: Low Risk  (10/13/2023)   Overall Financial Resource Strain (CARDIA)    Difficulty of Paying Living Expenses: Not hard at all  Food Insecurity: No Food Insecurity (10/13/2023)   Hunger Vital Sign    Worried About Running Out of Food in the Last Year: Never true    Ran Out of Food in the Last Year: Never true  Transportation Needs: No Transportation Needs (10/13/2023)   PRAPARE - Administrator, Civil Service (Medical): No    Lack of Transportation (Non-Medical): No  Physical Activity: Sufficiently Active (10/13/2023)   Exercise Vital Sign    Days of Exercise per Week: 7 days    Minutes of Exercise per Session: 30 min  Stress: No Stress Concern Present (10/13/2023)   [employer] of Occupational Health - Occupational Stress Questionnaire    Feeling of Stress : Only a little  Social Connections: Socially Isolated (10/13/2023)   Social Connection and Isolation Panel [NHANES]    Frequency of Communication with Friends and Family: Once a week    Frequency of Social Gatherings with Friends and Family: Once a week    Attends Religious Services: Never    Database administrator or Organizations: No    Attends Engineer, structural: Never    Marital Status: Never married    Tobacco Counseling Counseling given: Not  Answered  Clinical Intake:  Pre-visit preparation completed: Yes  Pain : No/denies pain    BMI - recorded: 31 Nutritional Status: BMI > 30  Obese Nutritional Risks: None Diabetes: No  How often do you need to have someone help you when you read instructions, pamphlets, or other written materials from your doctor or pharmacy?: 1 - Never  Interpreter Needed?: No  Comments: lives w/roommate Information entered by :: B.Hideko Esselman,LPN   Activities of Daily Living    10/13/2023    9:58 AM  In your present state of health, do you have any difficulty performing the following activities:  Hearing? 0  Vision? 1  Difficulty concentrating or making decisions? 0  Walking or climbing stairs? 0  Dressing or bathing? 0  Doing errands, shopping? 1  Comment drives less but ok now  Preparing Food and eating ? N  Using the Toilet? N  In the past six months, have you accidently leaked urine? N  Do you have problems with loss of bowel control? N  Managing your Medications? N  Managing your Finances? N  Housekeeping or managing your Housekeeping? N    Patient Care Team: Joaquim Nam, MD as PCP - General Croitoru, Rachelle Hora, MD as PCP - Cardiology (Cardiology) Dione Booze, Bertram Millard, MD as Consulting Physician (Ophthalmology) Kathyrn Sheriff, [facility] (Inactive) as Pharmacist (Pharmacist)  Indicate any recent Medical Services you may have received from other than [facility] providers in the past year (date may be approximate).     Assessment:   This is a routine wellness examination for [place].  Hearing/Vision screen Hearing Screening - Comments:: Pt says his hearing is good w/hearing aids (8months he has had) Vision Screening - Comments:: Pt says his vision is not good  Macular degeneration-rt :20/40 &amp; left: 20/300 Dr Hortense Ramal   Goals Addressed   None    Depression Screen    10/13/2023    9:48 AM 09/02/2023    4:01 PM 04/23/2023   12:43 PM 11/01/2022  11:15 AM 10/08/2022    9:21  AM 10/08/2022    9:15 AM 08/04/2020   11:40 AM  PHQ 2/9 Scores  PHQ - 2 Score 1 2 0 4 1 0 0  PHQ- 9 Score 2 4 0 12     Exception Documentation     Medical reason      Fall Risk    10/13/2023    9:42 AM 09/02/2023    4:01 PM 04/23/2023   12:42 PM 11/01/2022   11:15 AM 10/08/2022    9:07 AM  Fall Risk   Falls in the past year? 1 0 0 0 0  Number falls in past yr: 0 0 0 0 0  Injury with Fall? 0 0 0 0 0  Risk for fall due to : No Fall Risks;Impaired vision No Fall Risks No Fall Risks No Fall Risks No Fall Risks  Follow up Education provided;Falls prevention discussed Falls evaluation completed Falls evaluation completed Falls evaluation completed Falls prevention discussed;Falls evaluation completed    MEDICARE RISK AT HOME: Medicare Risk at Home Any stairs in or around the home?: Yes If so, are there any without handrails?: Yes Home free of loose throw rugs in walkways, pet beds, electrical cords, etc?: Yes Adequate lighting in your home to reduce risk of falls?: Yes Life alert?: No Use of a cane, walker or w/c?: No Grab bars in the bathroom?: No Shower chair or bench in shower?: No Elevated toilet seat or a handicapped toilet?: No  TIMED UP AND GO:  Was the test performed?  No    Cognitive Function:    12/24/2019   11:33 AM  MMSE - Mini Mental State Exam  Orientation to time 5  Orientation to Place 5  Registration 3  Attention/ Calculation 5  Recall 3  Language- repeat 1        10/13/2023   10:00 AM 10/08/2022    9:33 AM  6CIT Screen  What Year? 0 points 0 points  What month? 0 points 0 points  What time? 0 points 0 points  Count back from 20 0 points 0 points  Months in reverse 0 points 0 points  Repeat phrase 0 points 0 points  Total Score 0 points 0 points    Immunizations Immunization History  Administered Date(s) Administered   Influenza Split 08/28/2012   Influenza Whole 05/26/2008   Influenza, High Dose Seasonal PF 07/29/2018, 05/12/2019, 06/07/2023    Influenza, Seasonal, Injecte, Preservative Fre 06/20/2016   Influenza-Unspecified 07/17/2015, 06/18/2017   Janssen (J&J) SARS-COV-2 Vaccination 11/29/2019   PFIZER(Purple Top)SARS-COV-2 Vaccination 08/10/2020   Pfizer(Comirnaty)Fall Seasonal Vaccine 12 years and older 06/07/2023   Pneumococcal Polysaccharide-23 04/26/2005, 05/12/2019   Td 08/29/2008    TDAP status: Up to date  Flu Vaccine status: Up to date  Pneumococcal vaccine status: Up to date  Covid-19 vaccine status: Completed vaccines  Qualifies for Shingles Vaccine? Yes   Zostavax completed No   Shingrix Completed?: No.    Education has been provided regarding the importance of this vaccine. Patient has been advised to call insurance company to determine out of pocket expense if they have not yet received this vaccine. Advised may also receive vaccine at local pharmacy or Health Dept. Verbalized acceptance and understanding.  Screening Tests Health Maintenance  Topic Date Due   Hepatitis C Screening  Never done   Zoster Vaccines- Shingrix (1 of 2) Never done   DTaP/Tdap/Td (2 - Tdap) 08/29/2018   Pneumonia Vaccine 41+ Years old (2 of  2 - PCV) 05/11/2020   Medicare Annual Wellness (AWV)  10/12/2024   Colonoscopy  10/10/2030   INFLUENZA VACCINE  Completed   HPV VACCINES  Aged Out   COVID-19 Vaccine  Discontinued    Health Maintenance  Health Maintenance Due  Topic Date Due   Hepatitis C Screening  Never done   Zoster Vaccines- Shingrix (1 of 2) Never done   DTaP/Tdap/Td (2 - Tdap) 08/29/2018   Pneumonia Vaccine 46+ Years old (2 of 2 - PCV) 05/11/2020    Colorectal cancer screening: Type of screening: Colonoscopy. Completed 10/10/2020. Repeat every 10 years  Lung Cancer Screening: (Low Dose CT Chest recommended if Age 62-80 years, 20 pack-year currently smoking OR have quit w/in 15years.) does not qualify.   Lung Cancer Screening Referral: no  Additional Screening:  Hepatitis C Screening: does not qualify;  Completed no  Vision Screening: Recommended annual ophthalmology exams for early detection of glaucoma and other disorders of the eye. Is the patient up to date with their annual eye exam?  Yes  Who is the provider or what is the name of the office in which the patient attends annual eye exams? Dr Hortense Ramal If pt is not established with a provider, would they like to be referred to a provider to establish care? No .   Dental Screening: Recommended annual dental exams for proper oral hygiene  Diabetic Foot Exam: n/a  Community Resource Referral / Chronic Care Management: CRR required this visit?  No   CCM required this visit?  No    Plan:     I have personally reviewed and noted the following in the patient's chart:   Medical and social history Use of alcohol, tobacco or illicit drugs  Current medications and supplements including opioid prescriptions. Patient is not currently taking opioid prescriptions. Functional ability and status Nutritional status Physical activity Advanced directives List of other physicians Hospitalizations, surgeries, and ER visits in previous 12 months Vitals Screenings to include cognitive, depression, and falls Referrals and appointments  In addition, I have reviewed and discussed with patient certain preventive protocols, quality metrics, and best practice recommendations. A written personalized care plan for preventive services as well as general preventive health recommendations were provided to patient.    Sue Lush, LPN   01/28/5408   After Visit Summary: (MyChart) Due to this being a telephonic visit, the after visit summary with patients personalized plan was offered to patient via MyChart   Nurse Notes: Pt says he is slowly losing his sight despite injections and eye drops. He drives less and not at night.

## 2023-10-13 NOTE — Patient Instructions (Signed)
[name] , Thank you for taking time to come for your Medicare Wellness Visit. I appreciate your ongoing commitment to your health goals. Please review the following plan we discussed and let me know if I can assist you in the future.   Referrals/Orders/Follow-Ups/Clinician Recommendations: none  This is a list of the screening recommended for you and due dates:  Health Maintenance  Topic Date Due   Hepatitis C Screening  Never done   Zoster (Shingles) Vaccine (1 of 2) Never done   DTaP/Tdap/Td vaccine (2 - Tdap) 08/29/2018   Pneumonia Vaccine (2 of 2 - PCV) 05/11/2020   Medicare Annual Wellness Visit  10/12/2024   Colon Cancer Screening  10/10/2030   Flu Shot  Completed   HPV Vaccine  Aged Out   COVID-19 Vaccine  Discontinued    Advanced directives: (Declined) Advance directive discussed with you today. Even though you declined this today, please call our office should you change your mind, and we can give you the proper paperwork for you to fill out.  Next Medicare Annual Wellness Visit scheduled for next year: Yes 10/13/2024 @ 9:30am televisit

## 2023-10-25 ENCOUNTER — Other Ambulatory Visit: Payer: Self-pay | Admitting: Adult Health

## 2023-10-25 DIAGNOSIS — G4700 Insomnia, unspecified: Secondary | ICD-10-CM

## 2023-10-31 ENCOUNTER — Other Ambulatory Visit: Payer: Self-pay

## 2023-10-31 ENCOUNTER — Telehealth: Payer: Self-pay | Admitting: Adult Health

## 2023-10-31 DIAGNOSIS — G4700 Insomnia, unspecified: Secondary | ICD-10-CM

## 2023-10-31 MED ORDER — ZOLPIDEM TARTRATE 10 MG PO TABS: 10.0000 mg | ORAL_TABLET | Freq: Every day | ORAL | 0 refills | Status: DC

## 2023-10-31 NOTE — Telephone Encounter (Signed)
 Pt called asking for a refill on his zolpidem 10 mg. Pharmacy is [facility] on [address]

## 2023-10-31 NOTE — Telephone Encounter (Signed)
 Pended zolpidem to [facility]

## 2023-11-06 ENCOUNTER — Encounter: Payer: Self-pay | Admitting: Adult Health

## 2023-11-06 ENCOUNTER — Ambulatory Visit: Admitting: Adult Health

## 2023-11-06 DIAGNOSIS — F411 Generalized anxiety disorder: Secondary | ICD-10-CM

## 2023-11-06 DIAGNOSIS — G4700 Insomnia, unspecified: Secondary | ICD-10-CM | POA: Diagnosis not present

## 2023-11-06 DIAGNOSIS — F331 Major depressive disorder, recurrent, moderate: Secondary | ICD-10-CM

## 2023-11-06 DIAGNOSIS — F32A Depression, unspecified: Secondary | ICD-10-CM

## 2023-11-06 DIAGNOSIS — F419 Anxiety disorder, unspecified: Secondary | ICD-10-CM | POA: Diagnosis not present

## 2023-11-06 MED ORDER — ZOLPIDEM TARTRATE 10 MG PO TABS: 10.0000 mg | ORAL_TABLET | Freq: Every day | ORAL | 2 refills | Status: DC

## 2023-11-06 MED ORDER — CLORAZEPATE DIPOTASSIUM 7.5 MG PO TABS: ORAL_TABLET | ORAL | 2 refills | Status: DC

## 2023-11-06 NOTE — Progress Notes (Signed)
 [name] [id number] 22-Aug-1951 73 y.o.  Virtual Visit via Telephone Note  I connected with pt on 11/06/23 at 11:00 AM EDT by telephone and verified that I am speaking with the correct person using two identifiers.   I discussed the limitations, risks, security and privacy concerns of performing an evaluation and management service by telephone and the availability of in person appointments. I also discussed with the patient that there may be a patient responsible charge related to this service. The patient expressed understanding and agreed to proceed.   I discussed the assessment and treatment plan with the patient. The patient was provided an opportunity to ask questions and all were answered. The patient agreed with the plan and demonstrated an understanding of the instructions.   The patient was advised to call back or seek an in-person evaluation if the symptoms worsen or if the condition fails to improve as anticipated.  I provided 20 minutes of non-face-to-face time during this encounter.  The patient was located at home.  The provider was located at [facility].   Dorothyann Gibbs, NP   Subjective:   Patient ID:  [name] is a 73 y.o. (DOB 05-Aug-1951) male.  Chief Complaint: No chief complaint on file.   HPI YEHUDA PRINTUP presents for follow-up of  insomnia, anxiety and depression.   Describes mood today as "ok". Pleasant. Denies tearfulness. Mood symptoms - reports some depression, anxiety, and irritability. Reports stable interest and motivation. Denies panic attacks. Reports worry, rumination and over thinking. Reports ongoing health issues. Reports decreased financial stressors. Mood is consistent. Stating "I feel like I'm doing ok". Taking medications as prescribed.  Energy levels stable. Active, does not have a regular exercise routine.   Enjoys some usual interests and activities. Single. Lives with a roommate - cat and a dog. Mostly staying  home.  Appetite adequate. Weight stable - 213 pounds. Sleeps well most nights. Averages 6 hours. Reports focus and concentration difficulties. Completing tasks. Managing aspects of household. Retired, works part time. Denies SI or HI.  Denies AH or VH. Denies self harm. Denies substance use.  Previous medication trials: Zoloft, Ambien, Tranxene  Review of Systems:  Review of Systems  Musculoskeletal:  Negative for gait problem.  Neurological:  Negative for tremors.  Psychiatric/Behavioral:         Please refer to HPI   Medications: I have reviewed the patient's current medications.  Current Outpatient Medications  Medication Sig Dispense Refill   apixaban (ELIQUIS) 5 MG TABS tablet Take 1 tablet by mouth every 12 hours. REPLACES Xarleto. 60 tablet 5   cephALEXin (KEFLEX) 500 MG capsule Take 1 capsule (500 mg total) by mouth 3 (three) times daily. (Patient not taking: Reported on 10/13/2023) 15 capsule 1   clorazepate (TRANXENE) 7.5 MG tablet TAKE 1 TABLET(7.5 MG) BY MOUTH DAILY 30 tablet 0   ibuprofen (ADVIL,MOTRIN) 200 MG tablet Take 100-200 mg by mouth daily as needed for mild pain.     ketoconazole (NIZORAL) 2 % cream Apply topically 2 (two) times daily.     levothyroxine (SYNTHROID) 25 MCG tablet Take 1 tablet (25 mcg total) by mouth daily before breakfast. 90 tablet 1   Multiple Vitamin (MULTIVITAMIN WITH MINERALS) TABS tablet Take 1 tablet by mouth daily.     pantoprazole (PROTONIX) 40 MG tablet TAKE 1 TABLET EVERY MORNING BEFORE BREAKFAST 30 tablet 4   PROLENSA 0.07 % SOLN Place 1 drop into the right eye See admin instructions.  sertraline (ZOLOFT) 100 MG tablet Take 0.5 tablets (50 mg total) by mouth daily.     SIMBRINZA 1-0.2 % SUSP Place 1 drop into both eyes 3 (three) times daily.     zolpidem (AMBIEN) 10 MG tablet Take 1 tablet (10 mg total) by mouth at bedtime. 30 tablet 0   No current facility-administered medications for this visit.    Medication Side Effects:  None  Allergies:  Allergies  Allergen Reactions   Crestor [Rosuvastatin]     Mood changes   Escitalopram Oxalate     Mood changes, irritablity   Latex Other (See Comments)    Blood in urine with latex catheter- but this was likely from mechanical irritation, not from the latex itself   Sulfonamide Derivatives Rash    Past Medical History:  Diagnosis Date   Alcohol abuse, unspecified    history of   Anxiety    Arthritis    all over   Bladder cancer [facility]) 2011   Dr. Brunilda Payor   CAD (coronary artery disease) 2012   MI   Depression    Dyslipidemia    GERD (gastroesophageal reflux disease)    History of DVT (deep vein thrombosis) 11/08 and again in 2014   previously on coumadin   Hypertension    Hypothyroidism    Inguinal hernia without mention of obstruction or gangrene, unilateral or unspecified, (not specified as recurrent)    Internal hemorrhoids without mention of complication    Macular degeneration, wet (HCC)    Myocardial infarction (HCC)    2011    Other abnormal glucose    Personal history of other disorder of urinary system    Pneumonia    hx of 12/2019    Pulmonary embolism (HCC)    hx of    Stroke (HCC) 05/25/2005   TIA   Thrombocytopenia, unspecified (HCC)    TIA (transient ischemic attack) 2008    Family History  Problem Relation Age of Onset   Other Father        Hypotension from medication causing GI bleed   Heart failure Father    Emphysema Mother    Prostate cancer Neg Hx     Social History   Socioeconomic History   Marital status: Single    Spouse name: Not on file   Number of children: 0   Years of education: Not on file   Highest education level: Not on file  Occupational History   Occupation: Photographer  Tobacco Use   Smoking status: Former    Current packs/day: 0.00    Average packs/day: 1.5 packs/day for 25.0 years (37.5 ttl pk-yrs)    Types: Cigarettes    Start date: 08/27/1979    Quit date: 08/26/2004    Years since quitting:  19.2   Smokeless tobacco: Never  Vaping Use   Vaping status: Never Used  Substance and Sexual Activity   Alcohol use: No    Alcohol/week: 0.0 standard drinks of alcohol    Comment: Quit 2009   Drug use: Not Currently    Frequency: 2.0 times per week    Types: Marijuana    Comment: MJ-regular use  10/2013 no longer uses   Sexual activity: Yes  Other Topics Concern   Not on file  Social History Narrative   Photographer   Single; 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   Social Drivers of Health   Financial Resource Strain: Low Risk  (10/13/2023)   Overall Financial Resource Strain (CARDIA)  Difficulty of Paying Living Expenses: Not hard at all  Food Insecurity: No Food Insecurity (10/13/2023)   Hunger Vital Sign    Worried About Running Out of Food in the Last Year: Never true    Ran Out of Food in the Last Year: Never true  Transportation Needs: No Transportation Needs (10/13/2023)   PRAPARE - Administrator, Civil Service (Medical): No    Lack of Transportation (Non-Medical): No  Physical Activity: Sufficiently Active (10/13/2023)   Exercise Vital Sign    Days of Exercise per Week: 7 days    Minutes of Exercise per Session: 30 min  Stress: No Stress Concern Present (10/13/2023)   [employer] of Occupational Health - Occupational Stress Questionnaire    Feeling of Stress : Only a little  Social Connections: Socially Isolated (10/13/2023)   Social Connection and Isolation Panel [NHANES]    Frequency of Communication with Friends and Family: Once a week    Frequency of Social Gatherings with Friends and Family: Once a week    Attends Religious Services: Never    Database administrator or Organizations: No    Attends Banker Meetings: Never    Marital Status: Never married  Intimate Partner Violence: Not At Risk (10/13/2023)   Humiliation, Afraid, Rape, and Kick questionnaire    Fear of Current or Ex-Partner: No    Emotionally Abused: No    Physically  Abused: No    Sexually Abused: No    Past Medical History, Surgical history, Social history, and Family history were reviewed and updated as appropriate.   Please see review of systems for further details on the patient's review from today.   Objective:   Physical Exam:  There were no vitals taken for this visit.  Physical Exam Constitutional:      General: He is not in acute distress. Musculoskeletal:        General: No deformity.  Neurological:     Mental Status: He is alert and oriented to person, place, and time.     Coordination: Coordination normal.  Psychiatric:        Attention and Perception: Attention and perception normal. He does not perceive auditory or visual hallucinations.        Mood and Affect: Affect is not labile, blunt, angry or inappropriate.        Speech: Speech normal.        Behavior: Behavior normal.        Thought Content: Thought content normal. Thought content is not paranoid or delusional. Thought content does not include homicidal or suicidal ideation. Thought content does not include homicidal or suicidal plan.        Cognition and Memory: Cognition and memory normal.        Judgment: Judgment normal.     Comments: Insight intact     Lab Review:     Component Value Date/Time   NA 134 (L) 04/23/2023 1324   NA 137 02/18/2019 1549   K 4.0 04/23/2023 1324   CL 105 04/23/2023 1324   CO2 22 04/23/2023 1324   GLUCOSE 97 04/23/2023 1324   BUN 18 04/23/2023 1324   BUN 21 02/18/2019 1549   CREATININE 0.97 04/23/2023 1324   CREATININE 0.88 01/02/2017 0953   CALCIUM 9.0 04/23/2023 1324   PROT 7.6 07/01/2023 1015   ALBUMIN 4.1 07/01/2023 1015   AST 28 07/01/2023 1015   ALT 28 07/01/2023 1015   ALKPHOS 55 07/01/2023 1015   BILITOT  0.9 07/01/2023 1015   GFRNONAA >60 11/01/2020 1043   GFRAA >60 02/25/2020 1254       Component Value Date/Time   WBC 4.4 04/23/2023 1324   RBC 5.27 04/23/2023 1324   HGB 17.2 (H) 04/23/2023 1324   HGB 17.4  02/18/2019 1549   HCT 50.8 04/23/2023 1324   HCT 48.6 02/18/2019 1549   PLT 149.0 (L) 04/23/2023 1324   PLT 170 02/18/2019 1549   MCV 96.3 04/23/2023 1324   MCV 90 02/18/2019 1549   MCH 33.1 11/01/2020 1043   MCHC 33.8 04/23/2023 1324   RDW 13.7 04/23/2023 1324   RDW 12.5 02/18/2019 1549   LYMPHSABS 1.0 04/23/2023 1324   MONOABS 0.7 04/23/2023 1324   EOSABS 0.0 04/23/2023 1324   BASOSABS 0.0 04/23/2023 1324    No results found for: "POCLITH", "LITHIUM"   No results found for: "PHENYTOIN", "PHENOBARB", "VALPROATE", "CBMZ"   .res Assessment: Plan:    Plan:  PDMP reviewed  Stopped Zoloft - never started Prozac 20mg  daily  Ambien 10mg  daily  Chlorazepate 7.5mg  daily   RTC 3 months  20 minutes spent dedicated to the care of this patient on the date of this encounter to include pre-visit review of records, ordering of medication, post visit documentation, and face-to-face time with the patient discussing depression, anxiety and OCD. Discussed continuing current medication regimen.  Patient advised to contact office with any questions, adverse effects, or acute worsening in signs and symptoms.  Discussed potential benefits, risk, and side effects of benzodiazepines to include potential risk of tolerance and dependence, as well as possible drowsiness.  Advised patient not to drive if experiencing drowsiness and to take lowest possible effective dose to minimize risk of dependence and tolerance.  There are no diagnoses linked to this encounter.   Please see After Visit Summary for patient specific instructions.  Future Appointments  Date Time Provider Department Center  11/06/2023 11:00 AM Lanitra Battaglini, Thereasa Solo, NP [facility] None  10/13/2024  9:30 AM [facility] ANNUAL WELLNESS VISIT 1 [facility]    No orders of the defined types were placed in this encounter.     -------------------------------

## 2023-11-27 ENCOUNTER — Telehealth: Payer: Self-pay

## 2023-11-27 ENCOUNTER — Ambulatory Visit: Payer: Self-pay

## 2023-11-27 NOTE — Telephone Encounter (Signed)
 Chief Complaint: Groin/Abdominal pain  Symptoms: Groin pain mild to moderate ranging from 3-6/10 at times Frequency: Comes and goes  Pertinent Negatives: Patient denies fever, urinary symptoms, back pain  Disposition: [] ED /[] Urgent Care (no appt availability in office) / [x] Appointment(In office/virtual)/ []  [place] [facility]/ [] Home Care/ [] Refused Recommended Disposition /[]  Mobile Bus/ []  Follow-up with PCP Additional Notes: Patient states he has been experiencing groin pain for about 6 weeks now. Patient states he has a history of hernia x 3 and is concerned he may have a new development. Patient denies all other symptoms. Care advice was given and patient has been scheduled to be seen by PCP tomorrow afternoon.    Copied from CRM [id number]. Topic: Clinical - Red Word Triage >> Nov 27, 2023  4:23 PM Sim Boast F wrote: Red Word that prompted transfer to Nurse Triage: Patient having pain in lower groin area, says he had 3 hernia sx around that area and would like an appointment. Rates pain 5/10 Reason for Disposition  Abdominal pain is a chronic symptom (recurrent or ongoing AND present > 4 weeks)  Answer Assessment - Initial Assessment Questions 1. LOCATION: "Where does it hurt?"      Lower abdominal pain/ groining area 2. RADIATION: "Does the pain shoot anywhere else?" (e.g., chest, back)     No  3. ONSET: "When did the pain begin?" (Minutes, hours or days ago)      6 weeks ago  4. SUDDEN: "Gradual or sudden onset?"     Gradual  5. PATTERN "Does the pain come and go, or is it constant?"    - If it comes and goes: "How long does it last?" "Do you have pain now?"     (Note: Comes and goes means the pain is intermittent. It goes away completely between bouts.)    - If constant: "Is it getting better, staying the same, or getting worse?"      (Note: Constant means the pain never goes away completely; most serious pain is constant and gets worse.)      Comes and goes  6.  SEVERITY: "How bad is the pain?"  (e.g., Scale 1-10; mild, moderate, or severe)    - MILD (1-3): Doesn't interfere with normal activities, abdomen soft and not tender to touch.     - MODERATE (4-7): Interferes with normal activities or awakens from sleep, abdomen tender to touch.     - SEVERE (8-10): Excruciating pain, doubled over, unable to do any normal activities.       5/10 7. RECURRENT SYMPTOM: "Have you ever had this type of stomach pain before?" If Yes, ask: "When was the last time?" and "What happened that time?"      Yes, I had a hernia before  8. CAUSE: "What do you think is causing the stomach pain?"     Maybe a hernia  9. RELIEVING/AGGRAVATING FACTORS: "What makes it better or worse?" (e.g., antacids, bending or twisting motion, bowel movement)     Nothing either way 10. OTHER SYMPTOMS: "Do you have any other symptoms?" (e.g., back pain, diarrhea, fever, urination pain, vomiting)       No  Protocols used: Abdominal Pain - Male-A-AH

## 2023-11-27 NOTE — Telephone Encounter (Signed)
 Copied from CRM [id number]. Topic: General - Other >> Nov 27, 2023  8:51 AM [name] wrote: Reason for CRM: Patient would like to know if he could have lab drawn before he goes to his urology appointment on April 29th to check his PSA levels?Would like to have orders placed.

## 2023-11-28 ENCOUNTER — Encounter: Payer: Self-pay | Admitting: Family Medicine

## 2023-11-28 ENCOUNTER — Ambulatory Visit (INDEPENDENT_AMBULATORY_CARE_PROVIDER_SITE_OTHER): Admitting: Family Medicine

## 2023-11-28 VITALS — BP 142/82 | HR 73 | Temp 98.7°F | Ht 69.5 in | Wt 211.4 lb

## 2023-11-28 DIAGNOSIS — J069 Acute upper respiratory infection, unspecified: Secondary | ICD-10-CM

## 2023-11-28 DIAGNOSIS — Z125 Encounter for screening for malignant neoplasm of prostate: Secondary | ICD-10-CM

## 2023-11-28 DIAGNOSIS — R1030 Lower abdominal pain, unspecified: Secondary | ICD-10-CM | POA: Diagnosis not present

## 2023-11-28 LAB — PSA: PSA: 1.01 ng/mL (ref <=4.00)

## 2023-11-28 MED ORDER — AMOXICILLIN-POT CLAVULANATE 875-125 MG PO TABS: 1.0000 | ORAL_TABLET | Freq: Two times a day (BID) | ORAL | 0 refills | Status: DC

## 2023-11-28 NOTE — Progress Notes (Signed)
 He has f/u pending for [facility] in [place].  Distant h/o bladder cancer.  No blood in urine.  No pain with urination.   He has midline suprapubic pain, started about 3 months ago.  Not a sharp pain but variable level of pain. No testicle pain. He had been doing a lot of lifting.  He doesn't feel a mass.    Rhinorrhea.  Sick contact at home.  Sx started 36 hours ago.  Sinus pressure.  No fevers.  No vomiting.  No diarrhea. Some cough.  Some ST.  HA near B temples.   Meds, vitals, and allergies reviewed.   ROS: Per HPI unless specifically indicated in ROS section   Sinuses not ttp TM wnl MMM Nasal exam w/o purulent rhinorrhea Neck supple, no LA Rrr Ctab No BLE edema.

## 2023-11-28 NOTE — Telephone Encounter (Signed)
Will see at [facility] 

## 2023-11-28 NOTE — Telephone Encounter (Signed)
Will d/w pt at [place] today

## 2023-11-28 NOTE — Patient Instructions (Addendum)
 Go to the lab on the way out.   If you have mychart we'll likely use that to update you.    Hold the rx for augmentin.  If you aren't getting better next week, then start the rx.   Take care.  Glad to see you.

## 2023-11-30 NOTE — Assessment & Plan Note (Signed)
 Discussed checking PSA, with potential false positives noted.  Discussed checking urinalysis/urine culture before starting antibiotics.  See notes on labs.

## 2023-11-30 NOTE — Assessment & Plan Note (Signed)
 Hold Augmentin for now.  If progressive symptoms that he can start and update me as needed.  See after visit summary.  Okay for outpatient follow-up.

## 2023-12-01 ENCOUNTER — Other Ambulatory Visit: Payer: Self-pay | Admitting: Family Medicine

## 2023-12-01 ENCOUNTER — Other Ambulatory Visit (INDEPENDENT_AMBULATORY_CARE_PROVIDER_SITE_OTHER)

## 2023-12-01 DIAGNOSIS — R1030 Lower abdominal pain, unspecified: Secondary | ICD-10-CM | POA: Diagnosis not present

## 2023-12-01 LAB — URINALYSIS, ROUTINE W REFLEX MICROSCOPIC
Bilirubin Urine: NEGATIVE
Hgb urine dipstick: NEGATIVE
Ketones, ur: NEGATIVE
Leukocytes,Ua: NEGATIVE
Nitrite: NEGATIVE
RBC / HPF: NONE SEEN (ref 0–?)
Specific Gravity, Urine: 1.025 (ref 1.000–1.030)
Total Protein, Urine: NEGATIVE
Urine Glucose: NEGATIVE
Urobilinogen, UA: 0.2 (ref 0.0–1.0)
pH: 6 (ref 5.0–8.0)

## 2023-12-01 MED ORDER — AMOXICILLIN-POT CLAVULANATE 875-125 MG PO TABS: 1.0000 | ORAL_TABLET | Freq: Two times a day (BID) | ORAL | 0 refills | Status: AC

## 2023-12-01 NOTE — Telephone Encounter (Unsigned)
 Copied from CRM [id number]. Topic: Clinical - Medical Advice >> Dec 01, 2023 10:41 AM Marya Fossa L wrote: Reason for CRM: Patient stated that he dropped off a urine sample and wanted to know when he can speak with someone in reference to medication he needs

## 2023-12-02 LAB — URINE CULTURE
MICRO NUMBER:: 16297341
Result:: NO GROWTH
SPECIMEN QUALITY:: ADEQUATE

## 2023-12-23 DIAGNOSIS — R8289 Other abnormal findings on cytological and histological examination of urine: Secondary | ICD-10-CM | POA: Diagnosis not present

## 2023-12-23 DIAGNOSIS — C670 Malignant neoplasm of trigone of bladder: Secondary | ICD-10-CM | POA: Diagnosis not present

## 2023-12-23 DIAGNOSIS — N400 Enlarged prostate without lower urinary tract symptoms: Secondary | ICD-10-CM | POA: Diagnosis not present

## 2024-01-25 ENCOUNTER — Other Ambulatory Visit: Payer: Self-pay | Admitting: Physician Assistant

## 2024-02-03 ENCOUNTER — Telehealth: Payer: Self-pay | Admitting: Internal Medicine

## 2024-02-03 NOTE — Telephone Encounter (Signed)
 Patient requesting medication refill for pantoprazole. Please advise.   Thank you

## 2024-02-03 NOTE — Telephone Encounter (Signed)
 Patient has not been seen in over three years.  He needs to at least schedule an office visit for a refill

## 2024-02-04 NOTE — Telephone Encounter (Signed)
 Patient has been scheduled for 6/23. Please advise, thank you

## 2024-02-05 MED ORDER — PANTOPRAZOLE SODIUM 40 MG PO TBEC: 40.0000 mg | DELAYED_RELEASE_TABLET | Freq: Every day | ORAL | 2 refills | Status: DC

## 2024-02-05 NOTE — Addendum Note (Signed)
 Addended by: Elfa Wooton K on: 02/05/2024 10:07 AM   Modules accepted: Orders

## 2024-02-05 NOTE — Telephone Encounter (Signed)
 Protonix refilled

## 2024-02-13 NOTE — Progress Notes (Deleted)
 02/13/2024 [name] [id number] 04-05-1951   CHIEF COMPLAINT: Medication refill  HISTORY OF PRESENT ILLNESS: [name] is a 73 year old male with past medical history of TIA, DVT with PE on Xarelto , GERD and colon polyps.  He is known by Dr. Elvin Hammer.     Latest Ref Rng & Units 04/23/2023    1:24 PM 11/01/2020   10:43 AM 02/25/2020   12:54 PM  CBC  WBC 4.0 - 10.5 K/uL 4.4  9.4  5.9   Hemoglobin 13.0 - 17.0 g/dL 40.9  81.1  91.4   Hematocrit 39.0 - 52.0 % 50.8  52.8  50.8   Platelets 150.0 - 400.0 K/uL 149.0  184  160        Latest Ref Rng & Units 07/01/2023   10:15 AM 04/23/2023    1:24 PM 11/01/2020   10:43 AM  CMP  Glucose 70 - 99 mg/dL  97  782   BUN 6 - 23 mg/dL  18  21   Creatinine 9.56 - 1.50 mg/dL  2.13  0.86   Sodium 578 - 145 mEq/L  134  136   Potassium 3.5 - 5.1 mEq/L  4.0  4.1   Chloride 96 - 112 mEq/L  105  103   CO2 19 - 32 mEq/L  22  23   Calcium 8.4 - 10.5 mg/dL  9.0  9.3   Total Protein 6.0 - 8.5 g/dL 7.6  7.5  7.3   Total Bilirubin 0.0 - 1.2 mg/dL 0.9  0.6  1.4   Alkaline Phos 44 - 121 IU/L 55  45  33   AST 0 - 40 IU/L 28  29  34   ALT 0 - 44 IU/L 28  26  42     EGD 10/10/2020: 1. GERD with esophagitis and hiatal hernia.  2. Otherwise normal EGD. Colonoscopy 10/10/2020: - Diverticulosis in the left colon and in the right colon.  - Internal hemorrhoids.  - The examination was otherwise normal on direct and retroflexion views.  - No specimens collected. - Recall colonoscopy 10 years  Colonoscopy 10/12/2003: - Normal colonoscopy - Internal hemorrhoids  Past Medical History:  Diagnosis Date   Alcohol abuse, unspecified    history of   Anxiety    Arthritis    all over   Bladder cancer [facility]) 2011   Dr. Levi Real   CAD (coronary artery disease) 2012   MI   Depression    Dyslipidemia    GERD (gastroesophageal reflux disease)    History of DVT (deep vein thrombosis) 11/08 and again in 2014   previously on coumadin   Hypertension     Hypothyroidism    Inguinal hernia without mention of obstruction or gangrene, unilateral or unspecified, (not specified as recurrent)    Internal hemorrhoids without mention of complication    Macular degeneration, wet (HCC)    Myocardial infarction (HCC)    2011    Other abnormal glucose    Personal history of other disorder of urinary system    Pneumonia    hx of 12/2019    Pulmonary embolism (HCC)    hx of    Stroke (HCC) 05/25/2005   TIA   Thrombocytopenia, unspecified (HCC)    TIA (transient ischemic attack) 2008   Past Surgical History:  Procedure Laterality Date   CARDIAC CATHETERIZATION  05/2011   mild atherosclerosis   CYSTOSCOPY  07/12/2011   Procedure: CYSTOSCOPY;  Surgeon: Jinny Mounts, MD;  Location: [facility];  Service: Urology;  Laterality: N/A;  one hour being requested for this case   EYE SURGERY Left    injection from wet AMD (age related macular degeneration)   HERNIA REPAIR  8/11   repair of right indirect and direct hernias; incarerated umbilical hernia containing preperitoneal fat   INGUINAL HERNIA REPAIR Left 11/14/2020   Procedure: OPEN LEFT INGUINAL HERNIA REPAIR WITH MESH;  Surgeon: Jacolyn Matar, MD;  Location: [facility];  Service: General;  Laterality: Left;  90 MIN   SHOULDER SURGERY  03/08/08   Left; arthroscopy   TONSILLECTOMY     as child   TRANSURETHRAL RESECTION OF BLADDER TUMOR  07/12/2011   Procedure: TRANSURETHRAL RESECTION OF BLADDER TUMOR (TURBT);  Surgeon: Jinny Mounts, MD;  Location: [facility];  Service: Urology;  Laterality: N/A;   Social History: He reports that he quit smoking about 19 years ago. His smoking use included cigarettes. He started smoking about 44 years ago. He has a 37.5 pack-year smoking history. He has never used smokeless tobacco. He reports that he does not currently use drugs after having used the following drugs: Marijuana. Frequency: 2.00 times per week. He reports that he does not drink alcohol.  Family History:  Mother with emphysema.  Father with history of heart failure.  Allergies  Allergen Reactions   Crestor [Rosuvastatin]     Mood changes   Escitalopram Oxalate     Mood changes, irritablity   Latex Other (See Comments)    Blood in urine with latex catheter- but this was likely from mechanical irritation, not from the latex itself   Sulfonamide Derivatives Rash      Outpatient Encounter Medications as of 02/16/2024  Medication Sig   amoxicillin -clavulanate (AUGMENTIN ) 875-125 MG tablet Take 1 tablet by mouth 2 (two) times daily.   apixaban  (ELIQUIS ) 5 MG TABS tablet Take 1 tablet by mouth every 12 hours. REPLACES Xarleto.   clorazepate  (TRANXENE ) 7.5 MG tablet TAKE 1 TABLET(7.5 MG) BY MOUTH DAILY   ibuprofen (ADVIL,MOTRIN) 200 MG tablet Take 100-200 mg by mouth daily as needed for mild pain.   ketoconazole (NIZORAL) 2 % cream Apply topically 2 (two) times daily.   levothyroxine  (SYNTHROID ) 25 MCG tablet Take 1 tablet (25 mcg total) by mouth daily before breakfast.   Multiple Vitamin (MULTIVITAMIN WITH MINERALS) TABS tablet Take 1 tablet by mouth daily.   pantoprazole  (PROTONIX ) 40 MG tablet Take 1 tablet (40 mg total) by mouth daily.   PROLENSA 0.07 % SOLN Place 1 drop into the right eye See admin instructions.   sertraline  (ZOLOFT ) 100 MG tablet Take 0.5 tablets (50 mg total) by mouth daily.   SIMBRINZA 1-0.2 % SUSP Place 1 drop into both eyes 3 (three) times daily.   zolpidem  (AMBIEN ) 10 MG tablet Take 1 tablet (10 mg total) by mouth at bedtime.   No facility-administered encounter medications on file as of 02/16/2024.   REVIEW OF SYSTEMS:  Gen: Denies fever, sweats or chills. No weight loss.  CV: Denies chest pain, palpitations or edema. Resp: Denies cough, shortness of breath of hemoptysis.  GI: Denies heartburn, dysphagia, stomach or lower abdominal pain. No diarrhea or constipation.  GU: Denies urinary burning, blood in urine, increased urinary frequency or incontinence. MS:  Denies joint pain, muscles aches or weakness. Derm: Denies rash, itchiness, skin lesions or unhealing ulcers. Psych: Denies depression, anxiety, memory loss or confusion. Heme: Denies bruising, easy bleeding. Neuro:  Denies headaches, dizziness or paresthesias. Endo:  Denies any problems with DM, thyroid  or  adrenal function.  PHYSICAL EXAM: There were no vitals taken for this visit. General: in no acute distress. Head: Normocephalic and atraumatic. Eyes:  Sclerae non-icteric, conjunctive pink. Ears: Normal auditory acuity. Mouth: Dentition intact. No ulcers or lesions.  Neck: Supple, no lymphadenopathy or thyromegaly.  Lungs: Clear bilaterally to auscultation without wheezes, crackles or rhonchi. Heart: Regular rate and rhythm. No murmur, rub or gallop appreciated.  Abdomen: Soft, nontender, nondistended. No masses. No hepatosplenomegaly. Normoactive bowel sounds x 4 quadrants.  Rectal: Deferred.  Musculoskeletal: Symmetrical with no gross deformities. Skin: Warm and dry. No rash or lesions on visible extremities. Extremities: No edema. Neurological: Alert oriented x 4, no focal deficits.  Psychological: Alert and cooperative. Normal mood and affect.  ASSESSMENT AND PLAN:    CC:  Donnie Galea, MD

## 2024-02-16 ENCOUNTER — Ambulatory Visit: Admitting: Nurse Practitioner

## 2024-02-16 DIAGNOSIS — K080 Exfoliation of teeth due to systemic causes: Secondary | ICD-10-CM | POA: Diagnosis not present

## 2024-02-25 ENCOUNTER — Other Ambulatory Visit: Payer: Self-pay | Admitting: Adult Health

## 2024-02-25 DIAGNOSIS — G4700 Insomnia, unspecified: Secondary | ICD-10-CM

## 2024-02-25 DIAGNOSIS — F411 Generalized anxiety disorder: Secondary | ICD-10-CM

## 2024-03-05 ENCOUNTER — Encounter: Payer: Self-pay | Admitting: Adult Health

## 2024-03-05 ENCOUNTER — Telehealth (INDEPENDENT_AMBULATORY_CARE_PROVIDER_SITE_OTHER): Admitting: Adult Health

## 2024-03-05 DIAGNOSIS — G4700 Insomnia, unspecified: Secondary | ICD-10-CM | POA: Diagnosis not present

## 2024-03-05 DIAGNOSIS — F331 Major depressive disorder, recurrent, moderate: Secondary | ICD-10-CM

## 2024-03-05 DIAGNOSIS — F419 Anxiety disorder, unspecified: Secondary | ICD-10-CM | POA: Diagnosis not present

## 2024-03-05 DIAGNOSIS — F32A Depression, unspecified: Secondary | ICD-10-CM

## 2024-03-05 DIAGNOSIS — F411 Generalized anxiety disorder: Secondary | ICD-10-CM

## 2024-03-05 MED ORDER — CLORAZEPATE DIPOTASSIUM 7.5 MG PO TABS: ORAL_TABLET | ORAL | 2 refills | Status: DC

## 2024-03-05 MED ORDER — ZOLPIDEM TARTRATE 10 MG PO TABS
10.0000 mg | ORAL_TABLET | Freq: Every day | ORAL | 2 refills | Status: DC
Start: 2024-03-05 — End: 2024-06-09

## 2024-03-05 NOTE — Progress Notes (Signed)
 [name] [id number] Jul 01, 1951 73 y.o.  Virtual Visit via Video Note  I connected with pt @ on 03/05/24 at 10:00 AM EDT by a video enabled telemedicine application and verified that I am speaking with the correct person using two identifiers.   I discussed the limitations of evaluation and management by telemedicine and the availability of in person appointments. The patient expressed understanding and agreed to proceed.  I discussed the assessment and treatment plan with the patient. The patient was provided an opportunity to ask questions and all were answered. The patient agreed with the plan and demonstrated an understanding of the instructions.   The patient was advised to call back or seek an in-person evaluation if the symptoms worsen or if the condition fails to improve as anticipated.  I provided 20 minutes of non-face-to-face time during this encounter.  The patient was located at home.  The provider was located at [facility].   Angeline LOISE Sayers, NP   Subjective:   Patient ID:  [name] is a 73 y.o. (DOB 1951-05-13) male.  Chief Complaint: No chief complaint on file.   HPI [name] presents for follow-up of insomnia, anxiety and depression.   Describes mood today as ok. Pleasant. Denies tearfulness. Mood symptoms - reports depression and irritability. Denies anxiety. Reports decreased interest. Reports varying motivation. Denies panic attacks. Reports over thinking. Denies worry and rumination. Reports ongoing health issues. Reports financial stressors. Reports a recent phone scam in which he lost $6,000.00. Reports mood is consistent. Stating I feel like I'm doing ok. Taking medications as prescribed.  Energy levels stable. Active, does not have a regular exercise routine.   Enjoys some usual interests and activities. Single. Lives with a roommate - cat and a dog. Mostly staying home.  Appetite adequate. Weight stable - 213  pounds. Sleeps well most nights. Averages 6 hours. Reports focus and concentration difficulties. Completing tasks. Managing aspects of household. Retired, works part time. Denies SI or HI.  Denies AH or VH. Denies self harm. Denies substance use.  Previous medication trials: Zoloft , Ambien , Tranxene     Review of Systems:  Review of Systems  Musculoskeletal:  Negative for gait problem.  Neurological:  Negative for tremors.  Psychiatric/Behavioral:         Please refer to HPI    Medications: I have reviewed the patient's current medications.  Current Outpatient Medications  Medication Sig Dispense Refill   amoxicillin -clavulanate (AUGMENTIN ) 875-125 MG tablet Take 1 tablet by mouth 2 (two) times daily. 20 tablet 0   apixaban  (ELIQUIS ) 5 MG TABS tablet Take 1 tablet by mouth every 12 hours. REPLACES Xarleto. 60 tablet 5   clorazepate  (TRANXENE ) 7.5 MG tablet TAKE 1 TABLET(7.5 MG) BY MOUTH DAILY 30 tablet 0   ibuprofen (ADVIL,MOTRIN) 200 MG tablet Take 100-200 mg by mouth daily as needed for mild pain.     ketoconazole (NIZORAL) 2 % cream Apply topically 2 (two) times daily.     levothyroxine  (SYNTHROID ) 25 MCG tablet Take 1 tablet (25 mcg total) by mouth daily before breakfast. 90 tablet 1   Multiple Vitamin (MULTIVITAMIN WITH MINERALS) TABS tablet Take 1 tablet by mouth daily.     pantoprazole  (PROTONIX ) 40 MG tablet Take 1 tablet (40 mg total) by mouth daily. 30 tablet 2   PROLENSA 0.07 % SOLN Place 1 drop into the right eye See admin instructions.     sertraline  (ZOLOFT ) 100 MG tablet Take 0.5 tablets (50 mg total) by mouth daily.  SIMBRINZA 1-0.2 % SUSP Place 1 drop into both eyes 3 (three) times daily.     zolpidem  (AMBIEN ) 10 MG tablet TAKE 1 TABLET BY MOUTH EVERYDAY AT BEDTIME 30 tablet 0   No current facility-administered medications for this visit.    Medication Side Effects: None  Allergies:  Allergies  Allergen Reactions   Crestor [Rosuvastatin]     Mood  changes   Escitalopram Oxalate     Mood changes, irritablity   Latex Other (See Comments)    Blood in urine with latex catheter- but this was likely from mechanical irritation, not from the latex itself   Sulfonamide Derivatives Rash    Past Medical History:  Diagnosis Date   Alcohol abuse, unspecified    history of   Anxiety    Arthritis    all over   Bladder cancer [facility]) 2011   Dr. Aleene   CAD (coronary artery disease) 2012   MI   Depression    Dyslipidemia    GERD (gastroesophageal reflux disease)    History of DVT (deep vein thrombosis) 11/08 and again in 2014   previously on coumadin   Hypertension    Hypothyroidism    Inguinal hernia without mention of obstruction or gangrene, unilateral or unspecified, (not specified as recurrent)    Internal hemorrhoids without mention of complication    Macular degeneration, wet (HCC)    Myocardial infarction (HCC)    2011    Other abnormal glucose    Personal history of other disorder of urinary system    Pneumonia    hx of 12/2019    Pulmonary embolism (HCC)    hx of    Stroke (HCC) 05/25/2005   TIA   Thrombocytopenia, unspecified (HCC)    TIA (transient ischemic attack) 2008    Family History  Problem Relation Age of Onset   Other Father        Hypotension from medication causing GI bleed   Heart failure Father    Emphysema Mother    Prostate cancer Neg Hx     Social History   Socioeconomic History   Marital status: Single    Spouse name: Not on file   Number of children: 0   Years of education: Not on file   Highest education level: Not on file  Occupational History   Occupation: Photographer  Tobacco Use   Smoking status: Former    Current packs/day: 0.00    Average packs/day: 1.5 packs/day for 25.0 years (37.5 ttl pk-yrs)    Types: Cigarettes    Start date: 08/27/1979    Quit date: 08/26/2004    Years since quitting: 19.5   Smokeless tobacco: Never  Vaping Use   Vaping status: Never Used  Substance and  Sexual Activity   Alcohol use: No    Alcohol/week: 0.0 standard drinks of alcohol    Comment: Quit 2009   Drug use: Not Currently    Frequency: 2.0 times per week    Types: Marijuana    Comment: MJ-regular use  10/2013 no longer uses   Sexual activity: Yes  Other Topics Concern   Not on file  Social History Narrative   Photographer   Single; 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   Social Drivers of Health   Financial Resource Strain: Low Risk  (10/13/2023)   Overall Financial Resource Strain (CARDIA)    Difficulty of Paying Living Expenses: Not hard at all  Food Insecurity: No Food Insecurity (10/13/2023)   Hunger Vital  Sign    Worried About Programme researcher, broadcasting/film/video in the Last Year: Never true    Ran Out of Food in the Last Year: Never true  Transportation Needs: No Transportation Needs (10/13/2023)   PRAPARE - Administrator, Civil Service (Medical): No    Lack of Transportation (Non-Medical): No  Physical Activity: Sufficiently Active (10/13/2023)   Exercise Vital Sign    Days of Exercise per Week: 7 days    Minutes of Exercise per Session: 30 min  Stress: No Stress Concern Present (10/13/2023)   [employer] of Occupational Health - Occupational Stress Questionnaire    Feeling of Stress : Only a little  Social Connections: Socially Isolated (10/13/2023)   Social Connection and Isolation Panel    Frequency of Communication with Friends and Family: Once a week    Frequency of Social Gatherings with Friends and Family: Once a week    Attends Religious Services: Never    Database administrator or Organizations: No    Attends Banker Meetings: Never    Marital Status: Never married  Intimate Partner Violence: Not At Risk (10/13/2023)   Humiliation, Afraid, Rape, and Kick questionnaire    Fear of Current or Ex-Partner: No    Emotionally Abused: No    Physically Abused: No    Sexually Abused: No    Past Medical History, Surgical history, Social history, and  Family history were reviewed and updated as appropriate.   Please see review of systems for further details on the patient's review from today.   Objective:   Physical Exam:  There were no vitals taken for this visit.  Physical Exam Constitutional:      General: He is not in acute distress. Musculoskeletal:        General: No deformity.  Neurological:     Mental Status: He is alert and oriented to person, place, and time.     Coordination: Coordination normal.  Psychiatric:        Attention and Perception: Attention and perception normal. He does not perceive auditory or visual hallucinations.        Mood and Affect: Mood is depressed. Mood is not anxious. Affect is not labile, blunt, angry or inappropriate.        Speech: Speech normal.        Behavior: Behavior normal.        Thought Content: Thought content normal. Thought content is not paranoid or delusional. Thought content does not include homicidal or suicidal ideation. Thought content does not include homicidal or suicidal plan.        Cognition and Memory: Cognition and memory normal.        Judgment: Judgment normal.     Comments: Insight intact     Lab Review:     Component Value Date/Time   NA 134 (L) 04/23/2023 1324   NA 137 02/18/2019 1549   K 4.0 04/23/2023 1324   CL 105 04/23/2023 1324   CO2 22 04/23/2023 1324   GLUCOSE 97 04/23/2023 1324   BUN 18 04/23/2023 1324   BUN 21 02/18/2019 1549   CREATININE 0.97 04/23/2023 1324   CREATININE 0.88 01/02/2017 0953   CALCIUM 9.0 04/23/2023 1324   PROT 7.6 07/01/2023 1015   ALBUMIN 4.1 07/01/2023 1015   AST 28 07/01/2023 1015   ALT 28 07/01/2023 1015   ALKPHOS 55 07/01/2023 1015   BILITOT 0.9 07/01/2023 1015   GFRNONAA >60 11/01/2020 1043   GFRAA >60 02/25/2020  1254       Component Value Date/Time   WBC 4.4 04/23/2023 1324   RBC 5.27 04/23/2023 1324   HGB 17.2 (H) 04/23/2023 1324   HGB 17.4 02/18/2019 1549   HCT 50.8 04/23/2023 1324   HCT 48.6  02/18/2019 1549   PLT 149.0 (L) 04/23/2023 1324   PLT 170 02/18/2019 1549   MCV 96.3 04/23/2023 1324   MCV 90 02/18/2019 1549   MCH 33.1 11/01/2020 1043   MCHC 33.8 04/23/2023 1324   RDW 13.7 04/23/2023 1324   RDW 12.5 02/18/2019 1549   LYMPHSABS 1.0 04/23/2023 1324   MONOABS 0.7 04/23/2023 1324   EOSABS 0.0 04/23/2023 1324   BASOSABS 0.0 04/23/2023 1324    No results found for: POCLITH, LITHIUM   No results found for: PHENYTOIN, PHENOBARB, VALPROATE, CBMZ   .res Assessment: Plan:    Plan:  PDMP reviewed  Restarted Zoloft  at 150mg  daily - 10 days ago.  Ambien  10mg  daily  Chlorazepate 7.5mg  daily   RTC 3 months  20 minutes spent dedicated to the care of this patient on the date of this encounter to include pre-visit review of records, ordering of medication, post visit documentation, and face-to-face time with the patient discussing depression, anxiety and OCD. Discussed continuing current medication regimen - restarted Zoloft  10 days ago.  Patient advised to contact office with any questions, adverse effects, or acute worsening in signs and symptoms.  Discussed potential benefits, risk, and side effects of benzodiazepines to include potential risk of tolerance and dependence, as well as possible drowsiness.  Advised patient not to drive if experiencing drowsiness and to take lowest possible effective dose to minimize risk of dependence and tolerance.  There are no diagnoses linked to this encounter.   Please see After Visit Summary for patient specific instructions.  Future Appointments  Date Time Provider Department Center  04/27/2024  2:10 PM [place], PA-C [facility]  10/13/2024  9:30 AM [facility] ANNUAL WELLNESS VISIT 1 [facility]    No orders of the defined types were placed in this encounter.     -------------------------------

## 2024-04-27 ENCOUNTER — Ambulatory Visit: Admitting: Physician Assistant

## 2024-04-27 NOTE — Progress Notes (Deleted)
 Ellouise Console, PA-C [address] [place], [place]  [address] Phone: [phone]   Gastroenterology Consultation  Referring Provider:     Cleatus Arlyss RAMAN, MD Primary Care Physician:  Cleatus Arlyss RAMAN, MD Primary Gastroenterologist:  Ellouise Console, PA-C / Norleen Kiang, MD  Reason for Consultation:     Med refill pantoprazole         HPI:   [name] is a 73 y.o. y/o male referred for consultation & management  by Cleatus Arlyss RAMAN, MD.    Established patient of Dr. Kiang.  Presents for annual follow-up of GERD.  Currently taking pantoprazole  40 Mg once daily.  Needs medication refill.  09/2020 last EGD by Dr. Kiang: Distal erosive esophagitis.  Sliding hiatal hernia.  No Barrett's.  No stricture.  Normal stomach and duodenum.  No biopsies.  09/2020 colonoscopy by Dr. Kiang: No polyps.  Excellent prep.  Diverticulosis and moderate internal hemorrhoids.  10-year repeat screening colonoscopy recommended.  Past Medical History:  Diagnosis Date   Alcohol abuse, unspecified    history of   Anxiety    Arthritis    all over   Bladder cancer [facility]) 2011   Dr. Aleene   CAD (coronary artery disease) 2012   MI   Depression    Dyslipidemia    GERD (gastroesophageal reflux disease)    History of DVT (deep vein thrombosis) 11/08 and again in 2014   previously on coumadin   Hypertension    Hypothyroidism    Inguinal hernia without mention of obstruction or gangrene, unilateral or unspecified, (not specified as recurrent)    Internal hemorrhoids without mention of complication    Macular degeneration, wet (HCC)    Myocardial infarction (HCC)    2011    Other abnormal glucose    Personal history of other disorder of urinary system    Pneumonia    hx of 12/2019    Pulmonary embolism (HCC)    hx of    Stroke (HCC) 05/25/2005   TIA   Thrombocytopenia, unspecified (HCC)    TIA (transient ischemic attack) 2008    Past Surgical History:  Procedure Laterality Date   CARDIAC  CATHETERIZATION  05/2011   mild atherosclerosis   CYSTOSCOPY  07/12/2011   Procedure: CYSTOSCOPY;  Surgeon: Thomasine Aleene, MD;  Location: [facility];  Service: Urology;  Laterality: N/A;  one hour being requested for this case   EYE SURGERY Left    injection from wet AMD (age related macular degeneration)   HERNIA REPAIR  8/11   repair of right indirect and direct hernias; incarerated umbilical hernia containing preperitoneal fat   INGUINAL HERNIA REPAIR Left 11/14/2020   Procedure: OPEN LEFT INGUINAL HERNIA REPAIR WITH MESH;  Surgeon: Gladis Cough, MD;  Location: [facility];  Service: General;  Laterality: Left;  90 MIN   SHOULDER SURGERY  03/08/08   Left; arthroscopy   TONSILLECTOMY     as child   TRANSURETHRAL RESECTION OF BLADDER TUMOR  07/12/2011   Procedure: TRANSURETHRAL RESECTION OF BLADDER TUMOR (TURBT);  Surgeon: Thomasine Aleene, MD;  Location: [facility];  Service: Urology;  Laterality: N/A;    Prior to Admission medications   Medication Sig Start Date End Date Taking? Authorizing Provider  amoxicillin -clavulanate (AUGMENTIN ) 875-125 MG tablet Take 1 tablet by mouth 2 (two) times daily. 12/01/23   Cleatus Arlyss RAMAN, MD  apixaban  (ELIQUIS ) 5 MG TABS tablet Take 1 tablet by mouth every 12 hours. REPLACES Xarleto. 09/11/23  Cleatus Arlyss RAMAN, MD  clorazepate  (TRANXENE ) 7.5 MG tablet TAKE 1 TABLET(7.5 MG) BY MOUTH DAILY 03/05/24   Mozingo, Regina Nattalie, NP  ibuprofen (ADVIL,MOTRIN) 200 MG tablet Take 100-200 mg by mouth daily as needed for mild pain.    [provider]  ketoconazole (NIZORAL) 2 % cream Apply topically 2 (two) times daily. 08/28/23   [provider]  levothyroxine  (SYNTHROID ) 25 MCG tablet Take 1 tablet (25 mcg total) by mouth daily before breakfast. 02/04/20   Lamptey, Aleene KIDD, MD  Multiple Vitamin (MULTIVITAMIN WITH MINERALS) TABS tablet Take 1 tablet by mouth daily.    [provider]  pantoprazole  (PROTONIX ) 40 MG tablet Take 1 tablet (40 mg total)  by mouth daily. 02/05/24   Abran Norleen SAILOR, MD  PROLENSA 0.07 % SOLN Place 1 drop into the right eye See admin instructions. 10/24/20   [provider]  sertraline  (ZOLOFT ) 100 MG tablet Take 0.5 tablets (50 mg total) by mouth daily. 12/12/22   Mozingo, Regina Nattalie, NP  SIMBRINZA 1-0.2 % SUSP Place 1 drop into both eyes 3 (three) times daily. 10/14/20   [provider]  zolpidem  (AMBIEN ) 10 MG tablet Take 1 tablet (10 mg total) by mouth at bedtime. 03/05/24   Mozingo, Regina Nattalie, NP    Family History  Problem Relation Age of Onset   Other Father        Hypotension from medication causing GI bleed   Heart failure Father    Emphysema Mother    Prostate cancer Neg Hx      Social History   Tobacco Use   Smoking status: Former    Current packs/day: 0.00    Average packs/day: 1.5 packs/day for 25.0 years (37.5 ttl pk-yrs)    Types: Cigarettes    Start date: 08/27/1979    Quit date: 08/26/2004    Years since quitting: 19.6   Smokeless tobacco: Never  Vaping Use   Vaping status: Never Used  Substance Use Topics   Alcohol use: No    Alcohol/week: 0.0 standard drinks of alcohol    Comment: Quit 2009   Drug use: Not Currently    Frequency: 2.0 times per week    Types: Marijuana    Comment: MJ-regular use  10/2013 no longer uses    Allergies as of 04/27/2024 - Review Complete 03/05/2024  Allergen Reaction Noted   Crestor [rosuvastatin]  01/16/2012   Escitalopram oxalate     Latex Other (See Comments) 05/28/2011   Sulfonamide derivatives Rash 06/13/2006    Review of Systems:    All systems reviewed and negative except where noted in HPI.   Physical Exam:  There were no vitals taken for this visit. No LMP for male patient.  General:   Alert,  Well-developed, well-nourished, pleasant and cooperative in NAD Lungs:  Respirations even and unlabored.  Clear throughout to auscultation.   No wheezes, crackles, or rhonchi. No acute distress. Heart:  Regular rate and  rhythm; no murmurs, clicks, rubs, or gallops. Abdomen:  Normal bowel sounds.  No bruits.  Soft, and non-distended without masses, hepatosplenomegaly or hernias noted.  No Tenderness.  No guarding or rebound tenderness.    Neurologic:  Alert and oriented x3;  grossly normal neurologically. Psych:  Alert and cooperative. Normal mood and affect.  Imaging Studies: No results found.  Labs: CBC    Component Value Date/Time   WBC 4.4 04/23/2023 1324   RBC 5.27 04/23/2023 1324   HGB 17.2 (H) 04/23/2023 1324  HGB 17.4 02/18/2019 1549   HCT 50.8 04/23/2023 1324   HCT 48.6 02/18/2019 1549   PLT 149.0 (L) 04/23/2023 1324   PLT 170 02/18/2019 1549   MCV 96.3 04/23/2023 1324   MCV 90 02/18/2019 1549    CMP     Component Value Date/Time   NA 134 (L) 04/23/2023 1324   NA 137 02/18/2019 1549   K 4.0 04/23/2023 1324   CL 105 04/23/2023 1324   CO2 22 04/23/2023 1324   GLUCOSE 97 04/23/2023 1324   BUN 18 04/23/2023 1324   BUN 21 02/18/2019 1549   CREATININE 0.97 04/23/2023 1324   CREATININE 0.88 01/02/2017 0953   CALCIUM 9.0 04/23/2023 1324   PROT 7.6 07/01/2023 1015   ALBUMIN 4.1 07/01/2023 1015   AST 28 07/01/2023 1015   ALT 28 07/01/2023 1015   ALKPHOS 55 07/01/2023 1015   BILITOT 0.9 07/01/2023 1015   GFRNONAA >60 11/01/2020 1043   GFRAA >60 02/25/2020 1254    Assessment and Plan:   [name] is a 73 y.o. y/o male has been referred for   1.  GERD with erosive esophagitis and sliding hiatal hernia - Refilled pantoprazole  40 Mg once daily, #90, 3 refills. - Recommend Lifestyle Modifications to prevent Acid Reflux.  Rec. Avoid coffee, sodas, peppermint, garlic, onions, alcohol, citrus fruits, chocolate, tomatoes, fatty and spicey foods.  Avoid eating 2-3 hours before bedtime.      Follow up ***  Ellouise Console, PA-C

## 2024-05-02 ENCOUNTER — Other Ambulatory Visit: Payer: Self-pay | Admitting: Internal Medicine

## 2024-05-10 MED ORDER — PANTOPRAZOLE SODIUM 40 MG PO TBEC: 40.0000 mg | DELAYED_RELEASE_TABLET | Freq: Every day | ORAL | 3 refills | Status: DC

## 2024-05-10 NOTE — Telephone Encounter (Signed)
 Protonix refilled

## 2024-05-10 NOTE — Addendum Note (Signed)
 Addended by: Priscilla Kirstein K on: 05/10/2024 10:18 AM   Modules accepted: Orders

## 2024-05-10 NOTE — Telephone Encounter (Signed)
 Inbound call from pt stating that he is needing a refill on his pantoprazole  and pt has been schedule for 06/30/2024 at 11 am with Ellouise Console. Please advise.

## 2024-06-04 ENCOUNTER — Telehealth: Admitting: Adult Health

## 2024-06-09 ENCOUNTER — Encounter: Payer: Self-pay | Admitting: Adult Health

## 2024-06-09 ENCOUNTER — Ambulatory Visit (INDEPENDENT_AMBULATORY_CARE_PROVIDER_SITE_OTHER): Admitting: Adult Health

## 2024-06-09 DIAGNOSIS — G4700 Insomnia, unspecified: Secondary | ICD-10-CM | POA: Diagnosis not present

## 2024-06-09 DIAGNOSIS — F331 Major depressive disorder, recurrent, moderate: Secondary | ICD-10-CM

## 2024-06-09 DIAGNOSIS — F411 Generalized anxiety disorder: Secondary | ICD-10-CM

## 2024-06-09 MED ORDER — CLORAZEPATE DIPOTASSIUM 7.5 MG PO TABS: ORAL_TABLET | ORAL | 2 refills | Status: DC

## 2024-06-09 MED ORDER — ZOLPIDEM TARTRATE 10 MG PO TABS: 10.0000 mg | ORAL_TABLET | Freq: Every day | ORAL | 2 refills | Status: DC

## 2024-06-09 NOTE — Progress Notes (Signed)
 QAADIR KENT [id number] 09-22-1950 73 y.o.  Subjective:   Patient ID:  [name] is a 73 y.o. (DOB Jan 06, 1951) male.  Chief Complaint: No chief complaint on file.   HPI [name] presents to the office today for follow-up of insomnia, anxiety and depression.   Describes mood today as ok. Pleasant. Denies tearfulness. Mood symptoms - reports depression and irritability. Denies anxiety. Reports decreased interest and motivation. Denies panic attacks. Reports over thinking. Denies worry and rumination. Reports ongoing health issues. Reports financial stressors. Reports mood is consistent. Stating I feel like I'm doing alright. Taking medications as prescribed.  Energy levels stable. Active, does not have a regular exercise routine.   Enjoys some usual interests and activities. Single. Lives with a roommate - cat and 2 dogs. Mostly staying home.  Appetite adequate. Weight stable - 210 pounds. Sleeps well most nights. Averages 5 hours. Denies daytime napping. Reports focus and concentration difficulties. Completing tasks. Managing aspects of household. Retired, works part time. Denies SI or HI.  Denies AH or VH. Denies self harm. Denies substance use.  Previous medication trials: Zoloft , Ambien , Tranxene    GAD-7    Flowsheet Row Office Visit from 11/28/2023 in [facility] Visit from 09/02/2023 in [facility] [place] [facility] Visit from 04/23/2023 in [facility] Visit from 08/04/2020 in [facility]  Total GAD-7 Score 5 7 0 0   PHQ2-9    Flowsheet Row Office Visit from 11/28/2023 in [facility] [place] [facility] Clinical Support from 10/13/2023 in [facility] Office Visit from 09/02/2023 in [facility] [place] [facility] Office Visit from 04/23/2023 in [facility] [employer] Visit from 11/01/2022 in [facility] [place] [facility]  PHQ-2 Total Score 0 1 2 0 4  PHQ-9 Total Score 2 2 4  0 12   Flowsheet Row ED from 11/01/2020 in [facility] at [facility]  C-SSRS RISK CATEGORY Error: Question 6 not populated     Review of Systems:  Review of Systems  Musculoskeletal:  Negative for gait problem.  Neurological:  Negative for tremors.  Psychiatric/Behavioral:         Please refer to HPI    Medications: I have reviewed the patient's current medications.  Current Outpatient Medications  Medication Sig Dispense Refill   amoxicillin -clavulanate (AUGMENTIN ) 875-125 MG tablet Take 1 tablet by mouth 2 (two) times daily. 20 tablet 0   apixaban  (ELIQUIS ) 5 MG TABS tablet Take 1 tablet by mouth every 12 hours. REPLACES Xarleto. 60 tablet 5   clorazepate  (TRANXENE ) 7.5 MG tablet TAKE 1 TABLET(7.5 MG) BY MOUTH DAILY 30 tablet 2   ibuprofen (ADVIL,MOTRIN) 200 MG tablet Take 100-200 mg by mouth daily as needed for mild pain.     ketoconazole (NIZORAL) 2 % cream Apply topically 2 (two) times daily.     levothyroxine  (SYNTHROID ) 25 MCG tablet Take 1 tablet (25 mcg total) by mouth daily before breakfast. 90 tablet 1   Multiple Vitamin (MULTIVITAMIN WITH MINERALS) TABS tablet Take 1 tablet by mouth daily.     pantoprazole  (PROTONIX ) 40 MG tablet Take 1 tablet (40 mg total) by mouth daily. 30 tablet 3   PROLENSA 0.07 % SOLN Place 1 drop into the right eye See admin instructions.     sertraline  (ZOLOFT ) 100  MG tablet Take 0.5 tablets (50 mg total) by mouth daily.     SIMBRINZA 1-0.2 % SUSP Place 1 drop into both eyes 3 (three) times daily.     zolpidem  (AMBIEN ) 10 MG tablet Take 1 tablet (10 mg total) by mouth at bedtime. 30 tablet 2   No current facility-administered medications for this visit.    Medication Side Effects: None  Allergies:  Allergies  Allergen Reactions   Crestor  [Rosuvastatin]     Mood changes   Escitalopram Oxalate     Mood changes, irritablity   Latex Other (See Comments)    Blood in urine with latex catheter- but this was likely from mechanical irritation, not from the latex itself   Sulfonamide Derivatives Rash    Past Medical History:  Diagnosis Date   Alcohol abuse, unspecified    history of   Anxiety    Arthritis    all over   Bladder cancer [facility]) 2011   Dr. Aleene   CAD (coronary artery disease) 2012   MI   Depression    Dyslipidemia    GERD (gastroesophageal reflux disease)    History of DVT (deep vein thrombosis) 11/08 and again in 2014   previously on coumadin   Hypertension    Hypothyroidism    Inguinal hernia without mention of obstruction or gangrene, unilateral or unspecified, (not specified as recurrent)    Internal hemorrhoids without mention of complication    Macular degeneration, wet (HCC)    Myocardial infarction [facility])    2011    Other abnormal glucose    Personal history of other disorder of urinary system    Pneumonia    hx of 12/2019    Pulmonary embolism (HCC)    hx of    Stroke (HCC) 05/25/2005   TIA   Thrombocytopenia, unspecified    TIA (transient ischemic attack) 2008    Past Medical History, Surgical history, Social history, and Family history were reviewed and updated as appropriate.   Please see review of systems for further details on the patient's review from today.   Objective:   Physical Exam:  There were no vitals taken for this visit.  Physical Exam Constitutional:      General: He is not in acute distress. Musculoskeletal:        General: No deformity.  Neurological:     Mental Status: He is alert and oriented to person, place, and time.     Coordination: Coordination normal.  Psychiatric:        Attention and Perception: Attention and perception normal. He does not perceive auditory or visual hallucinations.        Mood and Affect: Mood normal. Mood is not anxious or  depressed. Affect is not labile, blunt, angry or inappropriate.        Speech: Speech normal.        Behavior: Behavior normal.        Thought Content: Thought content normal. Thought content is not paranoid or delusional. Thought content does not include homicidal or suicidal ideation. Thought content does not include homicidal or suicidal plan.        Cognition and Memory: Cognition and memory normal.        Judgment: Judgment normal.     Comments: Insight intact     Lab Review:     Component Value Date/Time   NA 134 (L) 04/23/2023 1324   NA 137 02/18/2019 1549   K 4.0 04/23/2023 1324   CL 105  04/23/2023 1324   CO2 22 04/23/2023 1324   GLUCOSE 97 04/23/2023 1324   BUN 18 04/23/2023 1324   BUN 21 02/18/2019 1549   CREATININE 0.97 04/23/2023 1324   CREATININE 0.88 01/02/2017 0953   CALCIUM 9.0 04/23/2023 1324   PROT 7.6 07/01/2023 1015   ALBUMIN 4.1 07/01/2023 1015   AST 28 07/01/2023 1015   ALT 28 07/01/2023 1015   ALKPHOS 55 07/01/2023 1015   BILITOT 0.9 07/01/2023 1015   GFRNONAA >60 11/01/2020 1043   GFRAA >60 02/25/2020 1254       Component Value Date/Time   WBC 4.4 04/23/2023 1324   RBC 5.27 04/23/2023 1324   HGB 17.2 (H) 04/23/2023 1324   HGB 17.4 02/18/2019 1549   HCT 50.8 04/23/2023 1324   HCT 48.6 02/18/2019 1549   PLT 149.0 (L) 04/23/2023 1324   PLT 170 02/18/2019 1549   MCV 96.3 04/23/2023 1324   MCV 90 02/18/2019 1549   MCH 33.1 11/01/2020 1043   MCHC 33.8 04/23/2023 1324   RDW 13.7 04/23/2023 1324   RDW 12.5 02/18/2019 1549   LYMPHSABS 1.0 04/23/2023 1324   MONOABS 0.7 04/23/2023 1324   EOSABS 0.0 04/23/2023 1324   BASOSABS 0.0 04/23/2023 1324    No results found for: POCLITH, LITHIUM   No results found for: PHENYTOIN, PHENOBARB, VALPROATE, CBMZ   .res Assessment: Plan:    Plan:  PDMP reviewed  Reports he is not currently taking the - Zoloft  at 150mg  daily   Ambien  10mg  daily  Chlorazepate 7.5mg  daily   RTC 3  months  20 minutes spent dedicated to the care of this patient on the date of this encounter to include pre-visit review of records, ordering of medication, post visit documentation, and face-to-face time with the patient discussing depression, anxiety and OCD. Discussed continuing current medication regimen.  Patient advised to contact office with any questions, adverse effects, or acute worsening in signs and symptoms.  Discussed potential benefits, risk, and side effects of benzodiazepines to include potential risk of tolerance and dependence, as well as possible drowsiness.  Advised patient not to drive if experiencing drowsiness and to take lowest possible effective dose to minimize risk of dependence and tolerance.  Diagnoses and all orders for this visit:  Major depressive disorder, recurrent episode, moderate (HCC)  Generalized anxiety disorder -     clorazepate  (TRANXENE ) 7.5 MG tablet; TAKE 1 TABLET(7.5 MG) BY MOUTH DAILY  Insomnia, unspecified type -     zolpidem  (AMBIEN ) 10 MG tablet; Take 1 tablet (10 mg total) by mouth at bedtime.     Please see After Visit Summary for patient specific instructions.  Future Appointments  Date Time Provider Department Center  06/30/2024 11:00 AM [place], PA-C [facility]  10/13/2024  9:30 AM [facility] ANNUAL WELLNESS VISIT 1 [facility] [address]    No orders of the defined types were placed in this encounter.   -------------------------------

## 2024-06-30 ENCOUNTER — Ambulatory Visit (INDEPENDENT_AMBULATORY_CARE_PROVIDER_SITE_OTHER): Admitting: Physician Assistant

## 2024-06-30 ENCOUNTER — Telehealth: Payer: Self-pay | Admitting: Physician Assistant

## 2024-06-30 DIAGNOSIS — K2100 Gastro-esophageal reflux disease with esophagitis, without bleeding: Secondary | ICD-10-CM | POA: Diagnosis not present

## 2024-06-30 MED ORDER — PANTOPRAZOLE SODIUM 40 MG PO TBEC: 40.0000 mg | DELAYED_RELEASE_TABLET | Freq: Every day | ORAL | 3 refills | Status: AC

## 2024-06-30 NOTE — Patient Instructions (Addendum)
 Hi Mr. Marchio,  It was nice to talk with you today.  I'm glad the Pantoprazole  is controlling your acid reflux.  - Continue Pantoprazole  40mg  once daily.  I sent prescription to [facility] with refills for 1 year.  - Continue Lifestyle Modifications to prevent Acid Reflux.  Recommend Avoid coffee, sodas, peppermint, garlic, onions, alcohol, citrus fruits, chocolate, tomatoes, fatty and spicey foods.  Avoid eating 2-3 hours before bedtime.    - We will plan to followup with you in 1 year to refill medication.   - It is also OK for your primary Care Provider to refill your pantoprazole  medication in 1 year.  - Your next 10 year repeat screening Colonoscopy will be due 09/2030.   You do not have to repeat another upper endoscopy unless you are having a lot of upper GI symptoms in spite of GERD treatment.  Enjoy the rest of your day!  Ellouise Console, PA-C

## 2024-06-30 NOTE — Telephone Encounter (Signed)
 Inbound call from patient stating that he is scheduled for an appointment at 11:00 and he is having issues with seeing this morning and wanted to see if he could switch the appointment to a virtual. Please advise.

## 2024-06-30 NOTE — Progress Notes (Unsigned)
 Philip Console, PA-C [address] [place], [place]  [address] Phone: [phone]   Gastroenterology Consultation  Referring Provider:     Cleatus Arlyss RAMAN, MD Primary Care Physician:  Cleatus Arlyss RAMAN, MD Primary Gastroenterologist:  Philip Console, PA-C / Norleen Kiang, MD  Reason for Consultation:     Follow-up GERD with esophagitis        HPI:   Virtual Visit via telephone note I connected with patient via telephone and verified that I am speaking with the correct person using two identifiers.  Patient did not have video available.  We were only to communicate by phone.  Patient is significantly visually impaired due to advanced macular degeneration.  He does not drive and has limited transportation.   I discussed the limitations, risks, security and privacy concerns of performing an evaluation and management service by video  and the availability of in person appointments. I also discussed with the patient that there may be a patient responsible charge related to this service. The patient expressed understanding and agreed to proceed.  Location of Patient: Home Location of Provider: [facility] office. Persons involved: Patient and provider only  73 year old male, established patient of Dr. Kiang, presents for follow-up of chronic GERD.  He has not had follow-up visit in our office since 2021.  Currently on pantoprazole  40 mg once daily and needs medication refill.  He is doing well on this medication as long as he takes it every day.  He denies any breakthrough heartburn.  If he misses pantoprazole  for 1 or 2 days, then he experiences moderate acid reflux.  He would like to continue medication.  No GI symptoms or concerns today.  Of note, he reports having several of his front teeth broken during his last EGD procedure in 2022.  These had to be repaired by dentist.  09/2020 EGD by Dr. Kiang: Distal esophagitis.  Sliding hiatal hernia.  No Barrett's.  No stricture.  Normal stomach  and duodenum.  No biopsies.  09/2020 colonoscopy by Dr. Kiang: Pandiverticulosis.  Moderate internal hemorrhoids.  Otherwise normal with no polyps.  Excellent prep.  10-year repeat.   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  Retired from [employer] in [place].  Past Medical History:  Diagnosis Date   Alcohol abuse, unspecified    history of   Anxiety    Arthritis    all over   Bladder cancer [facility]) 2011   Dr. Aleene   CAD (coronary artery disease) 2012   MI   Depression    Dyslipidemia    GERD (gastroesophageal reflux disease)    History of DVT (deep vein thrombosis) 11/08 and again in 2014   previously on coumadin   Hypertension    Hypothyroidism    Inguinal hernia without mention of obstruction or gangrene, unilateral or unspecified, (not specified as recurrent)    Internal hemorrhoids without mention of complication    Macular degeneration, wet (HCC)    Myocardial infarction (HCC)    2011    Other abnormal glucose    Personal history of other disorder of urinary system    Pneumonia    hx of 12/2019    Pulmonary embolism (HCC)    hx of    Stroke (HCC) 05/25/2005   TIA   Thrombocytopenia, unspecified    TIA (transient ischemic attack) 2008    Past Surgical History:  Procedure Laterality Date   CARDIAC CATHETERIZATION  05/2011   mild atherosclerosis  CYSTOSCOPY  07/12/2011   Procedure: CYSTOSCOPY;  Surgeon: Thomasine Oiler, MD;  Location: [facility];  Service: Urology;  Laterality: N/A;  one hour being requested for this case   EYE SURGERY Left    injection from wet AMD (age related macular degeneration)   HERNIA REPAIR  8/11   repair of right indirect and direct hernias; incarerated umbilical hernia containing preperitoneal fat   INGUINAL HERNIA REPAIR Left 11/14/2020   Procedure: OPEN LEFT INGUINAL HERNIA REPAIR WITH MESH;  Surgeon: Gladis Cough, MD;  Location: [facility];  Service: General;  Laterality: Left;  90 MIN   SHOULDER SURGERY  03/08/08    Left; arthroscopy   TONSILLECTOMY     as child   TRANSURETHRAL RESECTION OF BLADDER TUMOR  07/12/2011   Procedure: TRANSURETHRAL RESECTION OF BLADDER TUMOR (TURBT);  Surgeon: Thomasine Oiler, MD;  Location: [facility];  Service: Urology;  Laterality: N/A;    Prior to Admission medications   Medication Sig Start Date End Date Taking? Authorizing Provider  amoxicillin -clavulanate (AUGMENTIN ) 875-125 MG tablet Take 1 tablet by mouth 2 (two) times daily. 12/01/23   Cleatus Arlyss RAMAN, MD  apixaban  (ELIQUIS ) 5 MG TABS tablet Take 1 tablet by mouth every 12 hours. REPLACES Xarleto. 09/11/23   Cleatus Arlyss RAMAN, MD  clorazepate  (TRANXENE ) 7.5 MG tablet TAKE 1 TABLET(7.5 MG) BY MOUTH DAILY 06/09/24   Mozingo, Regina Nattalie, NP  ibuprofen (ADVIL,MOTRIN) 200 MG tablet Take 100-200 mg by mouth daily as needed for mild pain.    [provider]  ketoconazole (NIZORAL) 2 % cream Apply topically 2 (two) times daily. 08/28/23   [provider]  levothyroxine  (SYNTHROID ) 25 MCG tablet Take 1 tablet (25 mcg total) by mouth daily before breakfast. 02/04/20   Lamptey, Aleene KIDD, MD  Multiple Vitamin (MULTIVITAMIN WITH MINERALS) TABS tablet Take 1 tablet by mouth daily.    [provider]  pantoprazole  (PROTONIX ) 40 MG tablet Take 1 tablet (40 mg total) by mouth daily. 05/10/24   Abran Norleen SAILOR, MD  PROLENSA 0.07 % SOLN Place 1 drop into the right eye See admin instructions. 10/24/20   [provider]  sertraline  (ZOLOFT ) 100 MG tablet Take 0.5 tablets (50 mg total) by mouth daily. 12/12/22   Mozingo, Regina Nattalie, NP  SIMBRINZA 1-0.2 % SUSP Place 1 drop into both eyes 3 (three) times daily. 10/14/20   [provider]  zolpidem  (AMBIEN ) 10 MG tablet Take 1 tablet (10 mg total) by mouth at bedtime. 06/09/24   Mozingo, Regina Nattalie, NP    Family History  Problem Relation Age of Onset   Other Father        Hypotension from medication causing GI bleed   Heart failure Father     Emphysema Mother    Prostate cancer Neg Hx      Social History   Tobacco Use   Smoking status: Former    Current packs/day: 0.00    Average packs/day: 1.5 packs/day for 25.0 years (37.5 ttl pk-yrs)    Types: Cigarettes    Start date: 08/27/1979    Quit date: 08/26/2004    Years since quitting: 19.8   Smokeless tobacco: Never  Vaping Use   Vaping status: Never Used  Substance Use Topics   Alcohol use: No    Alcohol/week: 0.0 standard drinks of alcohol    Comment: Quit 2009   Drug use: Not Currently    Frequency: 2.0 times per week    Types: Marijuana    Comment: MJ-regular  use  10/2013 no longer uses    Allergies as of 06/30/2024 - Review Complete 06/09/2024  Allergen Reaction Noted   Crestor [rosuvastatin]  01/16/2012   Escitalopram oxalate     Latex Other (See Comments) 05/28/2011   Sulfonamide derivatives Rash 06/13/2006    Review of Systems:    All systems reviewed and negative except where noted in HPI.   Physical Exam:  There were no vitals taken for this visit. No LMP for male patient.  General:   Alert, pleasant and cooperative in NAD.  Answers questions appropriately.  Good mental status.  Alert and oriented x 3. Psych:  Alert and cooperative. Normal mood and affect.   Imaging Studies: No results found.  Labs: CBC    Component Value Date/Time   WBC 4.4 04/23/2023 1324   RBC 5.27 04/23/2023 1324   HGB 17.2 (H) 04/23/2023 1324   HGB 17.4 02/18/2019 1549   HCT 50.8 04/23/2023 1324   HCT 48.6 02/18/2019 1549   PLT 149.0 (L) 04/23/2023 1324   PLT 170 02/18/2019 1549   MCV 96.3 04/23/2023 1324   MCV 90 02/18/2019 1549    CMP     Component Value Date/Time   NA 134 (L) 04/23/2023 1324   NA 137 02/18/2019 1549   K 4.0 04/23/2023 1324   CL 105 04/23/2023 1324   CO2 22 04/23/2023 1324   GLUCOSE 97 04/23/2023 1324   BUN 18 04/23/2023 1324   BUN 21 02/18/2019 1549   CREATININE 0.97 04/23/2023 1324   CREATININE 0.88 01/02/2017 0953   CALCIUM 9.0  04/23/2023 1324   PROT 7.6 07/01/2023 1015   ALBUMIN 4.1 07/01/2023 1015   AST 28 07/01/2023 1015   ALT 28 07/01/2023 1015   ALKPHOS 55 07/01/2023 1015   BILITOT 0.9 07/01/2023 1015   GFRNONAA >60 11/01/2020 1043   GFRAA >60 02/25/2020 1254    Assessment and Plan:   LOT MEDFORD is a 73 y.o. y/o male has been referred for:  1.  GERD with esophagitis: Last EGD 09/2020 showed no evidence of Barrett's. - Refill pantoprazole  40 mg once daily #90 with 3 refills. - Recommend Lifestyle Modifications to prevent Acid Reflux.  Rec. Avoid coffee, sodas, peppermint, garlic, onions, alcohol, citrus fruits, chocolate, tomatoes, fatty and spicey foods.  Avoid eating 2-3 hours before bedtime.    2.  Sliding hiatal hernia - Reassurance.  Patient education discussed.  3.  Colon cancer screening is up-to-date: Negative colonoscopy 09/2020. - 10-year repeat screening colonoscopy will be due 09/2030.    I discussed the assessment and treatment plan with the patient. The patient was provided an opportunity to ask questions and all were answered. The patient agreed with the plan and demonstrated an understanding of the instructions.   The patient was advised to call back or seek an in-person evaluation if the symptoms worsen or if the condition fails to improve as anticipated.  I provided 10 minutes of telephone time during this encounter.  Follow up in 1 year or sooner if he develops GI symptoms.  Philip Console, PA-C

## 2024-07-01 ENCOUNTER — Encounter: Payer: Self-pay | Admitting: Physician Assistant

## 2024-07-01 NOTE — Progress Notes (Signed)
 Noted

## 2024-09-22 ENCOUNTER — Other Ambulatory Visit: Payer: Self-pay | Admitting: Adult Health

## 2024-09-22 DIAGNOSIS — G4700 Insomnia, unspecified: Secondary | ICD-10-CM

## 2024-09-22 NOTE — Telephone Encounter (Signed)
 Lvm for pt to call and schedule

## 2024-09-22 NOTE — Telephone Encounter (Signed)
 Please call pt to schedule his upcoming appt. He was due to return this month.  Thank you

## 2024-09-28 ENCOUNTER — Ambulatory Visit: Admitting: Adult Health

## 2024-09-28 ENCOUNTER — Encounter: Payer: Self-pay | Admitting: Adult Health

## 2024-09-28 DIAGNOSIS — G4700 Insomnia, unspecified: Secondary | ICD-10-CM

## 2024-09-28 DIAGNOSIS — F411 Generalized anxiety disorder: Secondary | ICD-10-CM

## 2024-09-28 DIAGNOSIS — F331 Major depressive disorder, recurrent, moderate: Secondary | ICD-10-CM

## 2024-09-28 MED ORDER — CLORAZEPATE DIPOTASSIUM 7.5 MG PO TABS: ORAL_TABLET | ORAL | 2 refills | Status: AC

## 2024-09-28 MED ORDER — ZOLPIDEM TARTRATE 10 MG PO TABS: 10.0000 mg | ORAL_TABLET | Freq: Every day | ORAL | 2 refills | Status: AC

## 2024-09-28 MED ORDER — SERTRALINE HCL 50 MG PO TABS: 50.0000 mg | ORAL_TABLET | Freq: Every day | ORAL | 5 refills | Status: AC

## 2024-10-13 ENCOUNTER — Ambulatory Visit: Payer: Medicare Other
